# Patient Record
Sex: Female | Born: 1985 | Race: Black or African American | Hispanic: No | Smoking: Never smoker
Health system: Southern US, Community
[De-identification: ages and names within clinical notes are randomized; demographics above are authoritative.]

## PROBLEM LIST (undated history)

## (undated) ENCOUNTER — Inpatient Hospital Stay (HOSPITAL_COMMUNITY): Payer: Self-pay

## (undated) DIAGNOSIS — J309 Allergic rhinitis, unspecified: Secondary | ICD-10-CM

## (undated) DIAGNOSIS — F419 Anxiety disorder, unspecified: Secondary | ICD-10-CM

## (undated) DIAGNOSIS — R87629 Unspecified abnormal cytological findings in specimens from vagina: Secondary | ICD-10-CM

## (undated) DIAGNOSIS — N83209 Unspecified ovarian cyst, unspecified side: Secondary | ICD-10-CM

## (undated) DIAGNOSIS — M2550 Pain in unspecified joint: Secondary | ICD-10-CM

## (undated) DIAGNOSIS — M3500 Sicca syndrome, unspecified: Secondary | ICD-10-CM

## (undated) DIAGNOSIS — K219 Gastro-esophageal reflux disease without esophagitis: Secondary | ICD-10-CM

## (undated) DIAGNOSIS — N750 Cyst of Bartholin's gland: Secondary | ICD-10-CM

## (undated) DIAGNOSIS — G43009 Migraine without aura, not intractable, without status migrainosus: Principal | ICD-10-CM

## (undated) HISTORY — DX: Cyst of Bartholin's gland: N75.0

## (undated) HISTORY — DX: Sjogren syndrome, unspecified: M35.00

## (undated) HISTORY — DX: Anxiety disorder, unspecified: F41.9

## (undated) HISTORY — PX: ADENOIDECTOMY: SUR15

## (undated) HISTORY — DX: Unspecified abnormal cytological findings in specimens from vagina: R87.629

## (undated) HISTORY — DX: Migraine without aura, not intractable, without status migrainosus: G43.009

## (undated) HISTORY — DX: Gastro-esophageal reflux disease without esophagitis: K21.9

## (undated) HISTORY — DX: Pain in unspecified joint: M25.50

## (undated) HISTORY — PX: THERAPEUTIC ABORTION: SHX798

## (undated) HISTORY — DX: Allergic rhinitis, unspecified: J30.9

## (undated) HISTORY — PX: TONSILLECTOMY: SUR1361

## (undated) HISTORY — PX: TYMPANOSTOMY TUBE PLACEMENT: SHX32

---
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
## 2006-10-18 ENCOUNTER — Emergency Department (HOSPITAL_COMMUNITY): Admission: EM | Admit: 2006-10-18 | Discharge: 2006-10-18 | Payer: Self-pay | Admitting: Family Medicine

## 2006-12-27 ENCOUNTER — Encounter: Admission: RE | Admit: 2006-12-27 | Discharge: 2006-12-27 | Payer: Self-pay | Admitting: Internal Medicine

## 2007-06-28 ENCOUNTER — Emergency Department (HOSPITAL_COMMUNITY): Admission: EM | Admit: 2007-06-28 | Discharge: 2007-06-28 | Payer: Self-pay | Admitting: Obstetrics and Gynecology

## 2007-12-26 ENCOUNTER — Inpatient Hospital Stay (HOSPITAL_COMMUNITY): Admission: AD | Admit: 2007-12-26 | Discharge: 2007-12-27 | Payer: Self-pay | Admitting: Obstetrics & Gynecology

## 2008-01-22 ENCOUNTER — Inpatient Hospital Stay (HOSPITAL_COMMUNITY): Admission: AD | Admit: 2008-01-22 | Discharge: 2008-01-22 | Payer: Self-pay | Admitting: Obstetrics & Gynecology

## 2008-03-11 ENCOUNTER — Inpatient Hospital Stay (HOSPITAL_COMMUNITY): Admission: AD | Admit: 2008-03-11 | Discharge: 2008-03-11 | Payer: Self-pay | Admitting: Obstetrics and Gynecology

## 2008-03-25 ENCOUNTER — Inpatient Hospital Stay (HOSPITAL_COMMUNITY): Admission: AD | Admit: 2008-03-25 | Discharge: 2008-03-25 | Payer: Self-pay | Admitting: Obstetrics and Gynecology

## 2008-04-11 ENCOUNTER — Inpatient Hospital Stay (HOSPITAL_COMMUNITY): Admission: AD | Admit: 2008-04-11 | Discharge: 2008-04-11 | Payer: Self-pay | Admitting: Obstetrics and Gynecology

## 2008-06-13 ENCOUNTER — Emergency Department (HOSPITAL_COMMUNITY): Admission: EM | Admit: 2008-06-13 | Discharge: 2008-06-13 | Payer: Self-pay | Admitting: Family Medicine

## 2008-08-06 ENCOUNTER — Inpatient Hospital Stay (HOSPITAL_COMMUNITY): Admission: AD | Admit: 2008-08-06 | Discharge: 2008-08-06 | Payer: Self-pay | Admitting: Obstetrics & Gynecology

## 2008-08-16 ENCOUNTER — Inpatient Hospital Stay (HOSPITAL_COMMUNITY): Admission: AD | Admit: 2008-08-16 | Discharge: 2008-08-16 | Payer: Self-pay | Admitting: Family Medicine

## 2008-08-21 ENCOUNTER — Inpatient Hospital Stay (HOSPITAL_COMMUNITY): Admission: AD | Admit: 2008-08-21 | Discharge: 2008-08-23 | Payer: Self-pay | Admitting: Obstetrics and Gynecology

## 2008-08-21 ENCOUNTER — Encounter (INDEPENDENT_AMBULATORY_CARE_PROVIDER_SITE_OTHER): Payer: Self-pay | Admitting: Obstetrics and Gynecology

## 2008-11-12 ENCOUNTER — Emergency Department (HOSPITAL_COMMUNITY): Admission: EM | Admit: 2008-11-12 | Discharge: 2008-11-12 | Payer: Self-pay | Admitting: Emergency Medicine

## 2009-02-15 ENCOUNTER — Inpatient Hospital Stay (HOSPITAL_COMMUNITY): Admission: AD | Admit: 2009-02-15 | Discharge: 2009-02-16 | Payer: Self-pay | Admitting: Obstetrics & Gynecology

## 2009-02-15 ENCOUNTER — Ambulatory Visit: Payer: Self-pay | Admitting: Advanced Practice Midwife

## 2009-02-16 ENCOUNTER — Emergency Department (HOSPITAL_COMMUNITY): Admission: EM | Admit: 2009-02-16 | Discharge: 2009-02-16 | Payer: Self-pay | Admitting: Emergency Medicine

## 2009-02-24 ENCOUNTER — Emergency Department (HOSPITAL_COMMUNITY): Admission: EM | Admit: 2009-02-24 | Discharge: 2009-02-24 | Payer: Self-pay | Admitting: Family Medicine

## 2009-05-14 ENCOUNTER — Ambulatory Visit: Payer: Self-pay | Admitting: Obstetrics and Gynecology

## 2009-05-14 ENCOUNTER — Inpatient Hospital Stay (HOSPITAL_COMMUNITY): Admission: AD | Admit: 2009-05-14 | Discharge: 2009-05-14 | Payer: Self-pay | Admitting: Obstetrics and Gynecology

## 2009-05-28 ENCOUNTER — Inpatient Hospital Stay (HOSPITAL_COMMUNITY): Admission: AD | Admit: 2009-05-28 | Discharge: 2009-05-28 | Payer: Self-pay | Admitting: Obstetrics & Gynecology

## 2009-05-31 ENCOUNTER — Ambulatory Visit: Admission: AD | Admit: 2009-05-31 | Discharge: 2009-05-31 | Payer: Self-pay | Admitting: Family Medicine

## 2009-06-06 ENCOUNTER — Encounter: Payer: Self-pay | Admitting: Family Medicine

## 2009-06-06 ENCOUNTER — Ambulatory Visit (HOSPITAL_COMMUNITY): Admission: RE | Admit: 2009-06-06 | Discharge: 2009-06-06 | Payer: Self-pay | Admitting: Family Medicine

## 2009-08-02 ENCOUNTER — Inpatient Hospital Stay (HOSPITAL_COMMUNITY): Admission: AD | Admit: 2009-08-02 | Discharge: 2009-08-02 | Payer: Self-pay | Admitting: Family Medicine

## 2009-09-03 ENCOUNTER — Ambulatory Visit (HOSPITAL_COMMUNITY): Admission: RE | Admit: 2009-09-03 | Discharge: 2009-09-03 | Payer: Self-pay | Admitting: Obstetrics

## 2010-01-19 ENCOUNTER — Inpatient Hospital Stay (HOSPITAL_COMMUNITY): Admission: AD | Admit: 2010-01-19 | Discharge: 2010-01-19 | Payer: Self-pay | Admitting: Obstetrics and Gynecology

## 2010-01-20 ENCOUNTER — Inpatient Hospital Stay (HOSPITAL_COMMUNITY): Admission: AD | Admit: 2010-01-20 | Discharge: 2010-01-22 | Payer: Self-pay | Admitting: Obstetrics and Gynecology

## 2010-01-28 ENCOUNTER — Inpatient Hospital Stay (HOSPITAL_COMMUNITY): Admission: AD | Admit: 2010-01-28 | Discharge: 2010-01-28 | Payer: Self-pay | Admitting: Obstetrics and Gynecology

## 2010-08-12 ENCOUNTER — Emergency Department (HOSPITAL_COMMUNITY): Admission: EM | Admit: 2010-08-12 | Discharge: 2010-08-12 | Payer: Self-pay | Admitting: Emergency Medicine

## 2010-10-18 ENCOUNTER — Encounter: Payer: Self-pay | Admitting: Obstetrics

## 2010-12-15 LAB — CBC
HCT: 37.8 % (ref 36.0–46.0)
HCT: 37.5 % (ref 36.0–46.0)
Hemoglobin: 12.6 g/dL (ref 12.0–15.0)
MCHC: 33.7 g/dL (ref 30.0–36.0)
MCHC: 33.8 g/dL (ref 30.0–36.0)
MCV: 93.1 fL (ref 78.0–100.0)
MCV: 93.3 fL (ref 78.0–100.0)
Platelets: 335 10*3/uL (ref 150–400)
Platelets: 228 10*3/uL (ref 150–400)
Platelets: 264 10*3/uL (ref 150–400)
RDW: 13 % (ref 11.5–15.5)
RDW: 12.8 % (ref 11.5–15.5)
WBC: 10.6 10*3/uL — ABNORMAL HIGH (ref 4.0–10.5)

## 2010-12-15 LAB — COMPREHENSIVE METABOLIC PANEL
ALT: 23 U/L (ref 0–35)
AST: 18 U/L (ref 0–37)
Albumin: 3 g/dL — ABNORMAL LOW (ref 3.5–5.2)
Calcium: 9.1 mg/dL (ref 8.4–10.5)
Creatinine, Ser: 0.69 mg/dL (ref 0.4–1.2)
Sodium: 138 mEq/L (ref 135–145)
Total Protein: 6.7 g/dL (ref 6.0–8.3)

## 2010-12-15 LAB — URINALYSIS, ROUTINE W REFLEX MICROSCOPIC
Glucose, UA: NEGATIVE mg/dL
Leukocytes, UA: NEGATIVE
Protein, ur: NEGATIVE mg/dL
Specific Gravity, Urine: 1.015 (ref 1.005–1.030)
pH: 7 (ref 5.0–8.0)

## 2010-12-15 LAB — BLOOD GAS, ARTERIAL
Acid-Base Excess: 1.5 mmol/L (ref 0.0–2.0)
FIO2: 0.21 %
O2 Saturation: 99 %
pO2, Arterial: 109 mmHg — ABNORMAL HIGH (ref 80.0–100.0)

## 2010-12-15 LAB — URINE MICROSCOPIC-ADD ON

## 2010-12-30 LAB — URINALYSIS, ROUTINE W REFLEX MICROSCOPIC
Ketones, ur: NEGATIVE mg/dL
Nitrite: NEGATIVE
Protein, ur: NEGATIVE mg/dL

## 2011-01-01 LAB — GC/CHLAMYDIA PROBE AMP, GENITAL: Chlamydia, DNA Probe: NEGATIVE

## 2011-01-01 LAB — URINALYSIS, ROUTINE W REFLEX MICROSCOPIC
Bilirubin Urine: NEGATIVE
Ketones, ur: 15 mg/dL — AB
Nitrite: NEGATIVE
Protein, ur: NEGATIVE mg/dL
Urobilinogen, UA: 1 mg/dL (ref 0.0–1.0)

## 2011-01-01 LAB — HCG, QUANTITATIVE, PREGNANCY: hCG, Beta Chain, Quant, S: 3501 m[IU]/mL — ABNORMAL HIGH (ref <5)

## 2011-01-01 LAB — ABO/RH: ABO/RH(D): O POS

## 2011-01-01 LAB — WET PREP, GENITAL
Clue Cells Wet Prep HPF POC: NONE SEEN
Trich, Wet Prep: NONE SEEN

## 2011-01-01 LAB — CBC
MCV: 92.8 fL (ref 78.0–100.0)
RBC: 4.36 MIL/uL (ref 3.87–5.11)
WBC: 6.9 10*3/uL (ref 4.0–10.5)

## 2011-01-02 LAB — URINALYSIS, ROUTINE W REFLEX MICROSCOPIC
Bilirubin Urine: NEGATIVE
Glucose, UA: NEGATIVE mg/dL
Hgb urine dipstick: NEGATIVE
Specific Gravity, Urine: 1.02 (ref 1.005–1.030)
pH: 7.5 (ref 5.0–8.0)

## 2011-01-05 LAB — URINALYSIS, ROUTINE W REFLEX MICROSCOPIC
Glucose, UA: NEGATIVE mg/dL
Specific Gravity, Urine: >1.03 — ABNORMAL HIGH (ref 1.005–1.030)
pH: 6 (ref 5.0–8.0)

## 2011-01-05 LAB — WET PREP, GENITAL
Trich, Wet Prep: NONE SEEN
Yeast Wet Prep HPF POC: NONE SEEN

## 2011-01-05 LAB — GC/CHLAMYDIA PROBE AMP, GENITAL: GC Probe Amp, Genital: NEGATIVE

## 2011-02-09 NOTE — H&P (Signed)
Veronica Raymond, Veronica Raymond              ACCOUNT NO.:  [id number]   MEDICAL RECORD NO.:  [id number]          PATIENT TYPE:  INP   LOCATION:  9169                          FACILITY:  [facility]   PHYSICIAN:  Guy Sandifer. Henderson Cloud, M.D. DATE OF BIRTH:  Feb 07, 1986   DATE OF ADMISSION:  08/21/2008  DATE OF DISCHARGE:                              HISTORY & PHYSICAL   CHIEF COMPLAINT:  Oligohydramnios   HISTORY OF PRESENT ILLNESS:  This patient is a 25 year old black female,  G2, P18, with an EDC of August 19, 2008, placing her 40-2/7 weeks  estimated gestational age with dates confirmed by a 13-week ultrasound.  Group B beta strep culture is negative.  Ultrasound evaluation today in  the office revealed a vertex baby with an estimated fetal weight of 6  pounds 5 ounces at the 7th percentile.  Amniotic fluid index is 5.1,  less than the 3rd percentile.  Biophysical profile was 6/8.  She denies  leaking, contractions.  There is good fetal movement.  No central  nervous system changes or epigastric pain.   PAST MEDICAL AND SURGICAL HISTORY, FAMILY HISTORY, OBSTETRIC HISTORY,  SOCIAL HISTORY:  See prenatal history and physical.   MEDICATIONS:  Prenatal vitamins.   ALLERGIES:  NO KNOWN DRUG ALLERGIES.   PHYSICAL EXAMINATION:  Height 5 feet 5 inches, weight 173.2 pounds,  blood pressure 110/70.  LUNGS:  Clear to auscultation.  HEART:  Regular rate and rhythm.  ABDOMEN:  Gravid, nontender.  No epigastric tenderness.  Cervix is 3-4 cm dilated, 90% effaced -1 station and vertex.  EXTREMITIES:  Grossly within normal limits.  NEUROLOGICAL EXAM:.  Deep tendon reflexes 2+ without clonus.   LABORATORY:  Group B beta strep culture is negative.  Blood type O+.  Rh  antibody screen negative.  RPR nonreactive.  Rubella immune.  Hepatitis  B surface antigen negative.  HIV nonreactive.   ASSESSMENT:  Intrauterine pregnancy at 40-2/7 weeks with  oligohydramnios.   PLAN:  We will admit.  Anticipate artificial  rupture of membranes.  Will  supplement with Pitocin, if necessary.      Guy Sandifer Henderson Cloud, M.D.  Electronically Signed     JET/MEDQ  D:  08/21/2008  T:  08/21/2008  Job:  [id number]

## 2011-03-01 IMAGING — CR DG CHEST 2V
2 series · 2 of 2 positions shown · non-contrast
Comparison: None.

CLINICAL DATA: History of shortness of breath and headache.  Chest
pain at night.

CHEST - 2 VIEW

[view not recorded (1 of 2)]
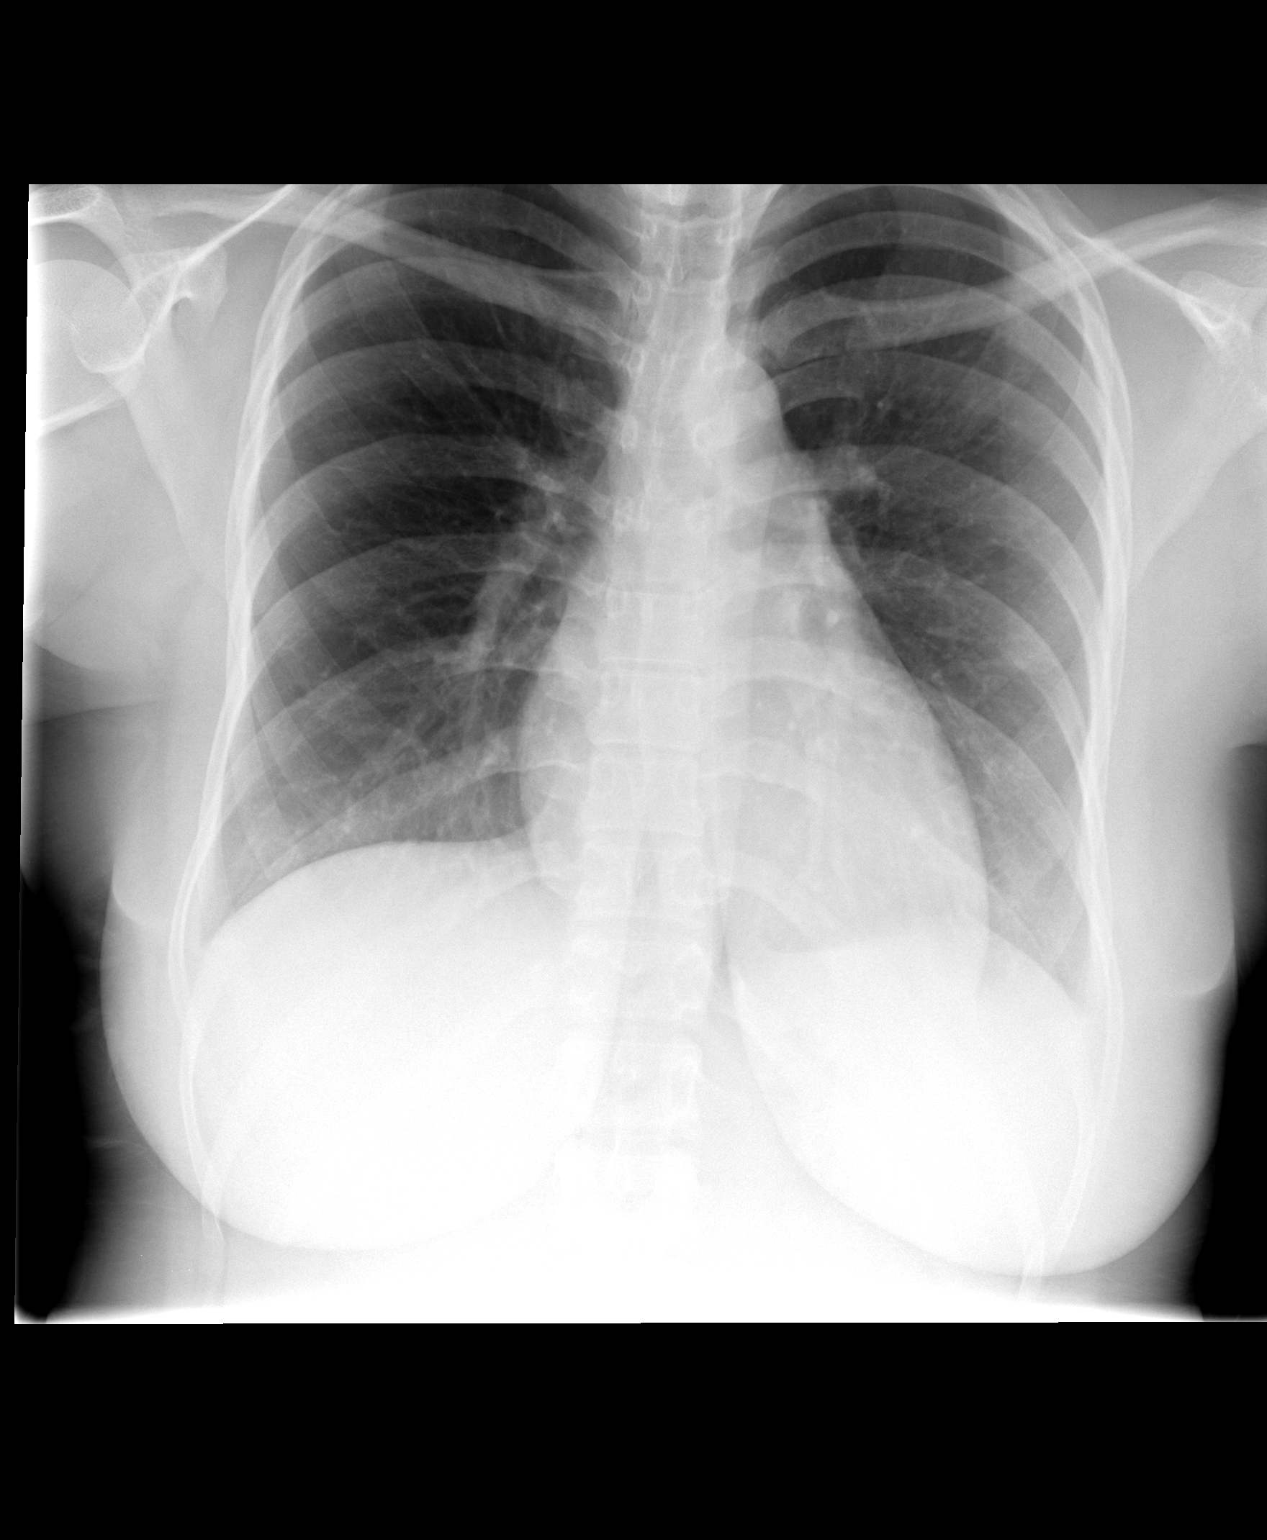

[view not recorded (2 of 2)]
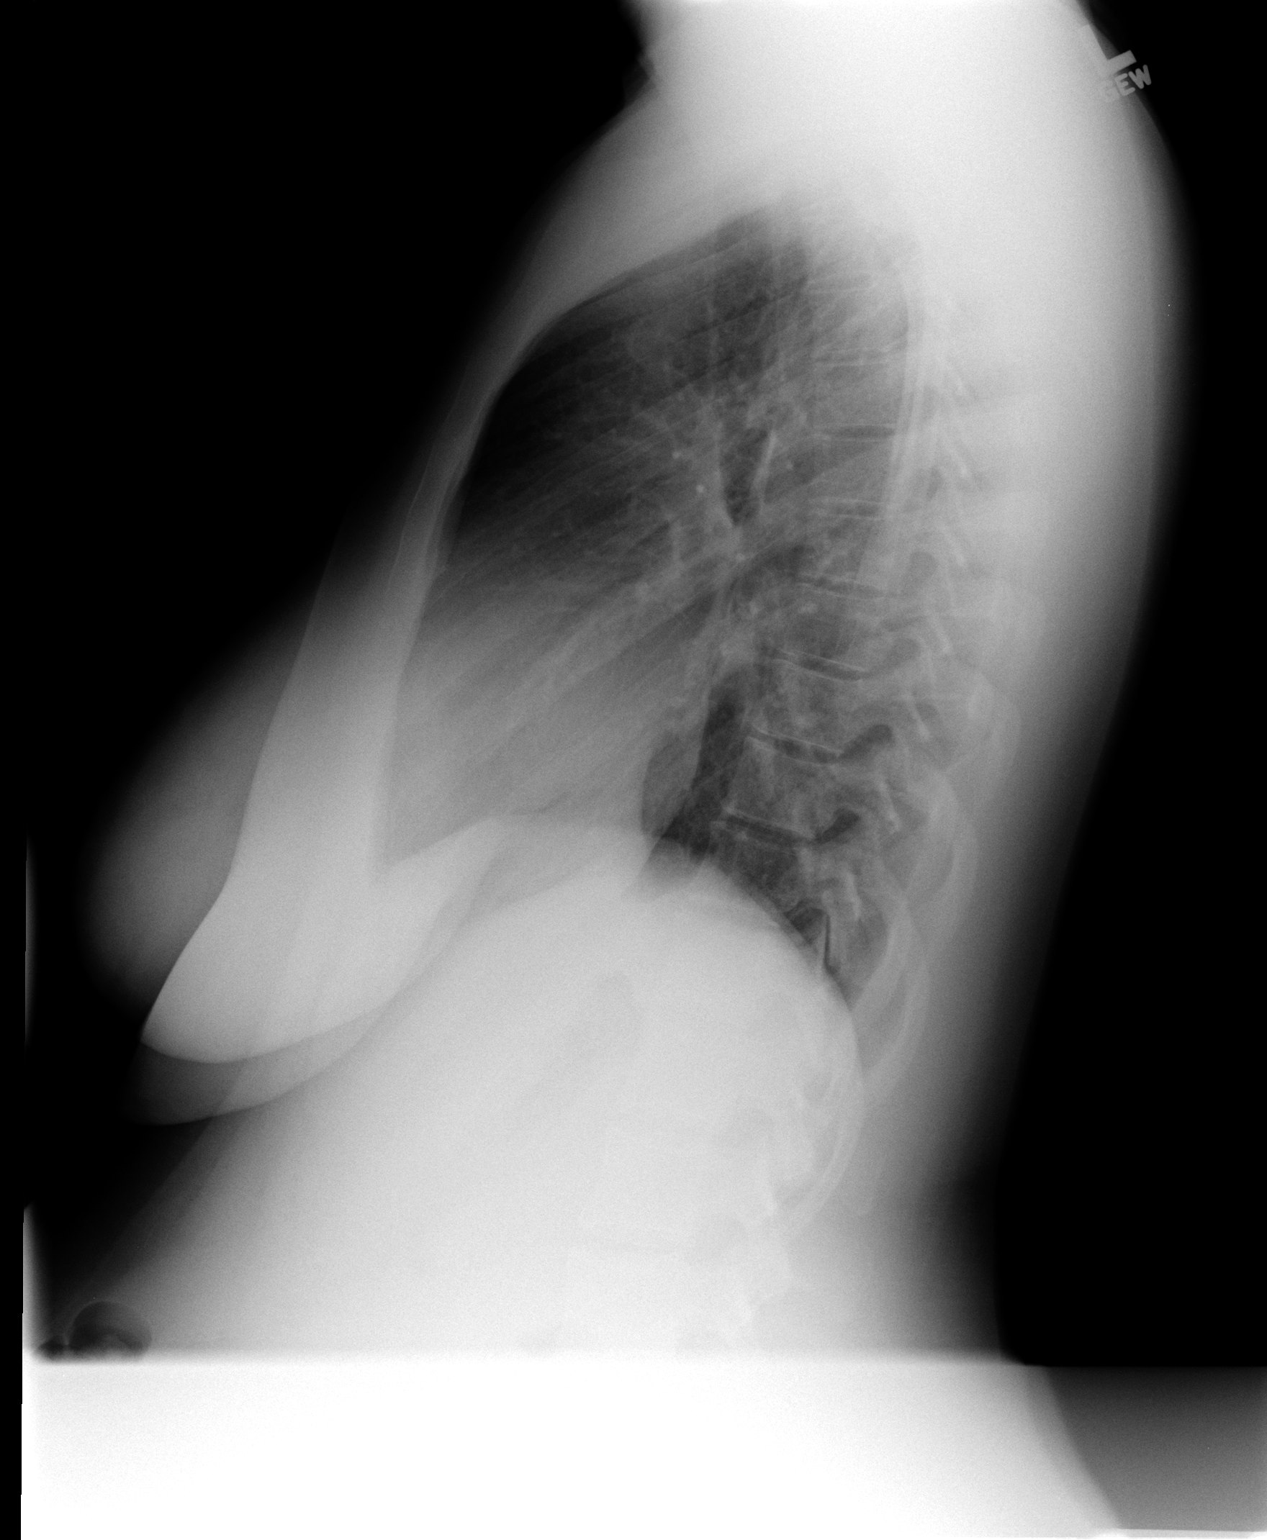

[2 of 2 positions shown; findings below may reference images not displayed]

FINDINGS: The cardiac silhouette is normal size and shape. The
lungs are well aerated and free of infiltrates. No pleural
abnormality is evident. Bones appear average for age.
IMPRESSION: No acute or active process is  identified.

## 2011-06-21 LAB — CBC
MCV: 90.5
RBC: 4.12
WBC: 6.5

## 2011-06-21 LAB — ABO/RH: ABO/RH(D): O POS

## 2011-06-21 LAB — URINALYSIS, ROUTINE W REFLEX MICROSCOPIC
Bilirubin Urine: NEGATIVE
Hgb urine dipstick: NEGATIVE
Nitrite: NEGATIVE
Specific Gravity, Urine: 1.015
pH: 7

## 2011-06-21 LAB — GC/CHLAMYDIA PROBE AMP, GENITAL: Chlamydia, DNA Probe: NEGATIVE

## 2011-06-21 LAB — WET PREP, GENITAL
Trich, Wet Prep: NONE SEEN
Yeast Wet Prep HPF POC: NONE SEEN

## 2011-06-21 LAB — POCT PREGNANCY, URINE: Operator id: 251141

## 2011-06-21 LAB — HCG, QUANTITATIVE, PREGNANCY: hCG, Beta Chain, Quant, S: 16101 — ABNORMAL HIGH

## 2011-06-22 LAB — URINALYSIS, ROUTINE W REFLEX MICROSCOPIC
Bilirubin Urine: NEGATIVE
Glucose, UA: NEGATIVE
Hgb urine dipstick: NEGATIVE
Ketones, ur: NEGATIVE
Protein, ur: NEGATIVE

## 2011-06-24 LAB — URINALYSIS, ROUTINE W REFLEX MICROSCOPIC
Bilirubin Urine: NEGATIVE
Glucose, UA: NEGATIVE
Hgb urine dipstick: NEGATIVE
Ketones, ur: NEGATIVE
Specific Gravity, Urine: 1.02
pH: 6

## 2011-06-29 LAB — CBC
HCT: 34 — ABNORMAL LOW
HCT: 36.2
Hemoglobin: 11.5 — ABNORMAL LOW
Hemoglobin: 12.3
MCHC: 33.9
MCV: 94.3
MCV: 94.6
Platelets: 261
RBC: 3.83 — ABNORMAL LOW
RDW: 13.3
WBC: 8.6

## 2011-07-08 LAB — DIFFERENTIAL
Basophils Relative: 0
Lymphs Abs: 2.5
Monocytes Absolute: 0.5
Monocytes Relative: 6
Neutro Abs: 5.7

## 2011-07-08 LAB — CBC
Hemoglobin: 12.7
MCHC: 34.1
MCV: 90.6
RBC: 4.12

## 2011-07-08 LAB — BASIC METABOLIC PANEL
BUN: 9
Creatinine, Ser: 0.76
GFR calc non Af Amer: >60
Glucose, Bld: 87
Potassium: 3.5

## 2012-04-04 ENCOUNTER — Emergency Department (HOSPITAL_COMMUNITY): Payer: Medicaid Other

## 2012-04-04 ENCOUNTER — Encounter (HOSPITAL_COMMUNITY): Payer: Self-pay | Admitting: *Deleted

## 2012-04-04 ENCOUNTER — Emergency Department (HOSPITAL_COMMUNITY)
Admission: EM | Admit: 2012-04-04 | Discharge: 2012-04-05 | Disposition: A | Discharge status: Home / Self Care | Payer: Medicaid Other | Attending: Emergency Medicine | Admitting: Emergency Medicine

## 2012-04-04 DIAGNOSIS — S56919A Strain of unspecified muscles, fascia and tendons at forearm level, unspecified arm, initial encounter: Secondary | ICD-10-CM

## 2012-04-04 DIAGNOSIS — X503XXA Overexertion from repetitive movements, initial encounter: Secondary | ICD-10-CM | POA: Insufficient documentation

## 2012-04-04 DIAGNOSIS — IMO0002 Reserved for concepts with insufficient information to code with codable children: Secondary | ICD-10-CM | POA: Insufficient documentation

## 2012-04-04 DIAGNOSIS — Y990 Civilian activity done for income or pay: Secondary | ICD-10-CM | POA: Insufficient documentation

## 2012-04-04 NOTE — ED Notes (Signed)
Pt was lifting boxes at work, felt sharp pain in left wrist and now it hurts to move.  When moving pain shoots up arm.

## 2012-04-05 MED ORDER — NAPROXEN 500 MG PO TABS: 500.0000 mg | ORAL_TABLET | Freq: Two times a day (BID) | ORAL | Status: DC

## 2012-04-05 MED ORDER — IBUPROFEN 400 MG PO TABS
800.0000 mg | ORAL_TABLET | Freq: Once | ORAL | Status: AC
  Administered 2012-04-05: 800 mg via ORAL
  Filled 2012-04-05: qty 2

## 2012-04-05 NOTE — ED Provider Notes (Signed)
History     CSN: [id number]  Arrival date & time 04/04/12  2049   First MD Initiated Contact with Patient 04/04/12 2337      Chief Complaint  Patient presents with  . Wrist Pain   HPI  History provided by the patient. Patient is a 26 year old female with no significant past medical history who presents with complaints of left wrist and forearm pains. Patient reports that pain began after work this evening. 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 She denies any specific injury during this time but then developed pain throughout her forearm. Patient states pain is worse with supination pronating her hand and wrist. She also has pain with flexion and extension with wrist. She denies any deformity. She denies any numbness or weakness in the fingers. Pain is described as moderate. Patient has not used any treatments for symptoms.   History reviewed. No pertinent past medical history.  Past Surgical History  Procedure Date  . Adenoidectomy     History reviewed. No pertinent family history.  History  Substance Use Topics  . Smoking status: Never Smoker   . Smokeless tobacco: Not on file  . Alcohol Use: No    OB History    Grav Para Term Preterm Abortions TAB SAB Ect Mult Living                  Review of Systems  Musculoskeletal: Negative for joint swelling.       Left wrist and forearm pain  Skin: Negative for rash.  Neurological: Negative for weakness and numbness.    Allergies  Iodinated diagnostic agents  Home Medications  No current outpatient prescriptions on file.  BP 106/57  Pulse 68  Temp 98.7 F (37.1 C) (Oral)  Resp 16  SpO2 99%  Physical Exam  Nursing note and vitals reviewed. Constitutional: She is oriented to person, place, and time. She appears well-developed and well-nourished. No distress.  HENT:  Head: Normocephalic.  Cardiovascular: Normal rate and regular rhythm.   Pulmonary/Chest:  Effort normal and breath sounds normal.  Musculoskeletal:       Pain with range of motion of left wrist and forearm but is full. Pain is greatest with supination. There is tenderness along the flexor muscles of the forearm. No gross deformity or mass. Normal pulses and wrist. Normal sensation in fingers and cap refill less than 2 seconds.  Neurological: She is alert and oriented to person, place, and time.  Skin: Skin is warm and dry. No erythema.  Psychiatric: She has a normal mood and affect. Her behavior is normal.    ED Course  Procedures   Dg Wrist Complete Left  04/04/2012  *RADIOLOGY REPORT*  Clinical Data: Left lateral wrist pain.  LEFT WRIST - COMPLETE 3+ VIEW  Comparison: None.  Findings: There is no evidence of fracture or dislocation.  The carpal rows are intact, and demonstrate normal alignment.  The joint spaces are preserved.  No significant soft tissue abnormalities are seen.  IMPRESSION: No evidence of fracture or dislocation.  Original Report Authenticated By: Tonia Ghent, M.D.     1. Muscle strain of forearm       MDM  12:00AM patient seen and evaluated. X-rays unremarkable. He should with tenderness along the muscles and tendons. This time suspect overuse and inflammation with strain to the muscles. Will advise patient to use RICE and followup with PCP.  Angus Seller, [place] 04/05/12 [phone]

## 2012-04-06 NOTE — ED Provider Notes (Signed)
Medical screening examination/treatment/procedure(s) were performed by non-physician practitioner and as supervising physician I was immediately available for consultation/collaboration.  Kit Brubacher, MD 04/06/12 2356 

## 2012-07-04 ENCOUNTER — Encounter (HOSPITAL_COMMUNITY): Payer: Self-pay | Admitting: *Deleted

## 2012-07-04 ENCOUNTER — Emergency Department (HOSPITAL_COMMUNITY)
Admission: EM | Admit: 2012-07-04 | Discharge: 2012-07-04 | Disposition: A | Discharge status: Home / Self Care | Payer: Medicaid Other | Attending: Emergency Medicine | Admitting: Emergency Medicine

## 2012-07-04 DIAGNOSIS — G43909 Migraine, unspecified, not intractable, without status migrainosus: Secondary | ICD-10-CM | POA: Insufficient documentation

## 2012-07-04 LAB — URINE MICROSCOPIC-ADD ON

## 2012-07-04 LAB — URINALYSIS, ROUTINE W REFLEX MICROSCOPIC
Glucose, UA: NEGATIVE mg/dL
Hgb urine dipstick: NEGATIVE
Protein, ur: NEGATIVE mg/dL
pH: 7.5 (ref 5.0–8.0)

## 2012-07-04 LAB — POCT PREGNANCY, URINE: Preg Test, Ur: NEGATIVE

## 2012-07-04 MED ORDER — DIPHENHYDRAMINE HCL 50 MG/ML IJ SOLN: 12.5000 mg | Freq: Once | INTRAMUSCULAR | Status: DC

## 2012-07-04 MED ORDER — METOCLOPRAMIDE HCL 5 MG/ML IJ SOLN
10.0000 mg | Freq: Once | INTRAMUSCULAR | Status: AC
  Administered 2012-07-04: 10 mg via INTRAVENOUS
  Filled 2012-07-04: qty 2

## 2012-07-04 MED ORDER — KETOROLAC TROMETHAMINE 30 MG/ML IJ SOLN
30.0000 mg | Freq: Once | INTRAMUSCULAR | Status: AC
  Administered 2012-07-04: 30 mg via INTRAVENOUS
  Filled 2012-07-04: qty 1

## 2012-07-04 MED ORDER — DIPHENHYDRAMINE HCL 50 MG/ML IJ SOLN
25.0000 mg | Freq: Once | INTRAMUSCULAR | Status: AC
  Administered 2012-07-04: 18:00:00 via INTRAVENOUS
  Filled 2012-07-04: qty 1

## 2012-07-04 MED ORDER — DEXAMETHASONE SODIUM PHOSPHATE 10 MG/ML IJ SOLN
10.0000 mg | Freq: Once | INTRAMUSCULAR | Status: AC
  Administered 2012-07-04: 10 mg via INTRAVENOUS
  Filled 2012-07-04: qty 1

## 2012-07-04 NOTE — ED Notes (Signed)
Pt c/o shaky and dizziness at meds (Reglan) will monitor Tia PA aware

## 2012-07-04 NOTE — ED Notes (Addendum)
Pt c/o of headache 10/10 started this morning around 0900.  Pt reports dizziness started at the time same. Hx of migraines. Pt also reports intermittent right hand numbness. At present pt is tearful. Reports blurry vision. Sensitivity to light.

## 2012-07-04 NOTE — ED Notes (Signed)
Patient discharged home by self.  Alert and oriented.  Discussed discharge instructions.  Patient verbalized understanding. Vitals WNL.  Headache pain tolerable at 1/10 at this time.  No other concerns.  Barrie Lyme 8:17 PM 07/04/2012

## 2012-07-04 NOTE — ED Provider Notes (Signed)
Medical screening examination/treatment/procedure(s) were performed by non-physician practitioner and as supervising physician I was immediately available for consultation/collaboration.  Tobin Chad, MD 07/04/12 2342

## 2012-07-04 NOTE — ED Notes (Signed)
Upon further assessment. Pt reports her dizziness is subsiding and headache is getting better. Pt reports she normally gets shots for headaches.

## 2012-07-04 NOTE — ED Notes (Signed)
PA at [facility] thinks pt should be evaluated further due to intermittent right hand numbness

## 2012-07-04 NOTE — ED Notes (Signed)
Pt also wears glasses and did not have her glasses at the time

## 2012-07-04 NOTE — ED Provider Notes (Signed)
History     CSN: [id number]  Arrival date & time 07/04/12  1258   First MD Initiated Contact with Patient 07/04/12 1444      Chief Complaint  Patient presents with  . Headache  . Dizziness    (Consider location/radiation/quality/duration/timing/severity/associated sxs/prior treatment) HPI Comments: Patient presents with migraine headache. She states that the headache is frontal, 10/10, with associated dizziness, nausea, right arm tingling, blurry vision, photophobia, and photophobia. States that this is like her other migraines. Denies fever or chills. Denies V,D, or abdominal pain. Denies neck stiffness. Denies that this is the worst headache of her life.  The history is provided by the patient. No language interpreter was used.    History reviewed. No pertinent past medical history.  Past Surgical History  Procedure Date  . Adenoidectomy     History reviewed. No pertinent family history.  History  Substance Use Topics  . Smoking status: Never Smoker   . Smokeless tobacco: Not on file  . Alcohol Use: No    OB History    Grav Para Term Preterm Abortions TAB SAB Ect Mult Living                  Review of Systems  Constitutional: Negative for fever and chills.  HENT: Negative for neck stiffness.   Eyes: Positive for photophobia and visual disturbance.  Gastrointestinal: Positive for nausea. Negative for vomiting, abdominal pain and diarrhea.  Neurological: Positive for numbness and headaches.    Allergies  Iodinated diagnostic agents  Home Medications   Current Outpatient Rx  Name Route Sig Dispense Refill  . LEVONORGESTREL 0.75 MG PO TABS Oral Take 0.75 mg by mouth once. Emergency contraception      BP 130/50  Pulse 83  Temp 98.2 F (36.8 C)  Resp 16  SpO2 100%  Physical Exam  Nursing note and vitals reviewed. Constitutional: She is oriented to person, place, and time. She appears well-developed and well-nourished. She appears distressed.  HENT:    Head: Normocephalic and atraumatic.  Mouth/Throat: Oropharynx is clear and moist.  Eyes: Conjunctivae normal and EOM are normal. Pupils are equal, round, and reactive to light. No scleral icterus.       No deficits on visual fields by confrontation.  Neck: Normal range of motion. Neck supple.  Cardiovascular: Normal rate, regular rhythm and normal heart sounds.   Pulmonary/Chest: Effort normal and breath sounds normal.  Abdominal: Soft. Bowel sounds are normal. There is no tenderness.  Musculoskeletal: Normal range of motion.       Neck has good ROM.  Neurological: She is alert and oriented to person, place, and time. No cranial nerve deficit. She exhibits normal muscle tone. Coordination normal.       Cranial nerves II-XII grossly intact. Good sensation. Good strength. No pronator drift. Negative romberg. No facial droop.    ED Course  Procedures (including critical care time)  Labs Reviewed  URINALYSIS, ROUTINE W REFLEX MICROSCOPIC - Abnormal; Notable for the following:    APPearance CLOUDY (*)     Leukocytes, UA MODERATE (*)     All other components within normal limits  GLUCOSE, CAPILLARY  POCT PREGNANCY, URINE  URINE MICROSCOPIC-ADD ON   Results for orders placed during the hospital encounter of 07/04/12  GLUCOSE, CAPILLARY      Component Value Range   Glucose-Capillary 82  70 - 99 mg/dL  URINALYSIS, ROUTINE W REFLEX MICROSCOPIC      Component Value Range   Color, Urine YELLOW  YELLOW   APPearance CLOUDY (*) CLEAR   Specific Gravity, Urine 1.022  1.005 - 1.030   pH 7.5  5.0 - 8.0   Glucose, UA NEGATIVE  NEGATIVE mg/dL   Hgb urine dipstick NEGATIVE  NEGATIVE   Bilirubin Urine NEGATIVE  NEGATIVE   Ketones, ur NEGATIVE  NEGATIVE mg/dL   Protein, ur NEGATIVE  NEGATIVE mg/dL   Urobilinogen, UA 1.0  0.0 - 1.0 mg/dL   Nitrite NEGATIVE  NEGATIVE   Leukocytes, UA MODERATE (*) NEGATIVE  POCT PREGNANCY, URINE      Component Value Range   Preg Test, Ur NEGATIVE  NEGATIVE   URINE MICROSCOPIC-ADD ON      Component Value Range   Squamous Epithelial / LPF RARE  RARE   WBC, UA 7-10  <3 WBC/hpf   Bacteria, UA RARE  RARE    No results found.   1. Migraine       MDM  Patient presented with migraine similar to her other migraines. Patient complained of right arm numbness and tingling. No neuro deficits or facial droop. Patient given migraine cocktail with resolution of symptoms. Patient discharged with return precautions. No red flags for meningitis, subarachnoid, or CVA.       Pixie Casino, PA-C 07/04/12 1834

## 2012-07-06 ENCOUNTER — Emergency Department (HOSPITAL_COMMUNITY)
Admission: EM | Admit: 2012-07-06 | Discharge: 2012-07-06 | Disposition: A | Discharge status: Home / Self Care | Payer: Medicaid Other | Attending: Emergency Medicine | Admitting: Emergency Medicine

## 2012-07-06 ENCOUNTER — Encounter (HOSPITAL_COMMUNITY): Payer: Self-pay | Admitting: *Deleted

## 2012-07-06 DIAGNOSIS — Z91041 Radiographic dye allergy status: Secondary | ICD-10-CM | POA: Insufficient documentation

## 2012-07-06 DIAGNOSIS — I808 Phlebitis and thrombophlebitis of other sites: Secondary | ICD-10-CM | POA: Insufficient documentation

## 2012-07-06 MED ORDER — NAPROXEN 375 MG PO TABS: 375.0000 mg | ORAL_TABLET | Freq: Two times a day (BID) | ORAL | Status: DC

## 2012-07-06 MED ORDER — CEPHALEXIN 500 MG PO CAPS: 500.0000 mg | ORAL_CAPSULE | Freq: Four times a day (QID) | ORAL | Status: DC

## 2012-07-06 NOTE — ED Notes (Signed)
Pt reports being seen here x 2 days ago, had an IV access.  Pt reports that the IV site was swollen and painful.  Bruising and tenderness noted.  Pt reports bruising is getting bigger.  Denies taking any blood thinners.

## 2012-07-06 NOTE — Discharge Instructions (Signed)
You have phlebitis of the vein in which you had IV few days ago. Start warm compresses to the area multiple times a day. Take naprosyn as prescribed. If redness worsening, start keflex as prescribed, and take it until all gone. Follow up with your doctor as needed, return if worsening.   Phlebitis Phlebitis is a redness, tenderness and soreness (inflammation) in a vein. This can occur in your arms, legs, or torso (trunk), as well as deeper inside your body.  CAUSES  Phlebitis can be triggered by multiple factors. These include:  Reduced (restricted) blood flow through your veins. This happens with prolonged bed rest, long distance travel, injury or surgery. Being overweight (obese) and pregnant can also restrict blood flow and lead to phlebitis.  Putting a catheter in the vein (intravenous or IV) and giving certain medications through in the vein (intravenously).  Cancer and cancer treatment.  Use of illegal intravenous drugs.  Inflammatory diseases.  Inherited (genetic) diseases that increase the risk for blood clots.  Hormone therapy (such as birth control pills). SYMPTOMS   Red, tender, swollen, painful area on your skin.  Usually, the area will be long and narrow.  Low grade fever.  Significant firmness along the center of this area. This can indicate that a blood clot has formed.  Surrounding redness or a high fever, which can indicate an infection (cellulitis). DIAGNOSIS   The appearance of your condition and your symptoms will cause your caregiver to suspect phlebitis. Usually, this is enough for a diagnosis.  Your caregiver may request blood tests or an ultrasound test of the area to be sure you do not have an infection or a blood clot. Blood tests and discussing your family history may also indicate if you have an underlying genetic disease that causes blood clots.  Occasionally, a piece of tissue is taken from the body (biopsy) if an unusual cause of phlebitis is  suspected. TREATMENT   Raise (elevate) the affected area above the level of the heart.  Apply a warm compress or heating pad for 20 minutes, 3 or 4 times a day. If you use an electric heating pad, follow the directions so you do not burn yourself.  Anti-inflammatory medications are usually recommended. Follow your caregiver's directions.  Any IV catheter, if present, will be removed by your caregiver.  Your caregiver may prescribe medicines that kill germs (antibiotics) if an infection is present.  Your caregiver may recommend blood thinners if a blood clot is suspected or present.  Support stockings or bandages may be helpful, depending on the cause and location of the phlebitis.  Surgery may be needed to remove very damaged sections of vein, but this is rare. HOME CARE INSTRUCTIONS   Take medications exactly as prescribed.  Follow up with your caregiver as directed.  Use support stockings or bandages if advised. These will speed healing and prevent recurrence.  If you are on blood thinners:  Do follow-up blood tests exactly as directed.  Check with your caregiver before using any new medications.  Wear a pendant to show that you are on blood thinners.  For phlebitis in the legs:  Avoid prolonged standing or bed rest.  Keep your legs moving. Raise your legs with sitting or lying.  Do not smoke.  Women, particularly those over the age of 12, should consider the risks and benefits of taking the contraceptive pill. This kind of hormone treatment can increase your risk for blood clots. SEEK MEDICAL CARE IF:   You have  unusual bruising or any bleeding problems.  Swelling or pain in your affected arm or leg is not gradually improving.  You are on anti-inflammatory medication and you develop belly (abdominal) pain. SEEK IMMEDIATE MEDICAL CARE IF:   An unexplained oral temperature above 100.5 F (38.1 C) develops.  You have sudden onset of chest pain or difficulty  breathing. Document Released: 09/07/2001 Document Revised: 12/06/2011 Document Reviewed: 06/09/2009 [facility] Patient Information 2013 [place], [place].

## 2012-07-06 NOTE — ED Provider Notes (Signed)
History     CSN: [id number]  Arrival date & time 07/06/12  1304   First MD Initiated Contact with Patient 07/06/12 1502      Chief Complaint  Patient presents with  . Bleeding/Bruising    from IV site    (Consider location/radiation/quality/duration/timing/severity/associated sxs/prior treatment) HPI Comments: Veronica Raymond is a 26 y.o. Female who presents with complaint of pain and bruising in left arm at a recent IV site. Pt states she was seen two days ago, for migraine, and had IV in the left antecubital fossa. States area was bruised, but since yesterday, it is warm to the touch, and redness spreading. Area is tender. Pt states she had IVs before and this has never happened before. States no fever, chills, malaise.     History reviewed. No pertinent past medical history.  Past Surgical History  Procedure Date  . Adenoidectomy     No family history on file.  History  Substance Use Topics  . Smoking status: Never Smoker   . Smokeless tobacco: Not on file  . Alcohol Use: No    OB History    Grav Para Term Preterm Abortions TAB SAB Ect Mult Living                  Review of Systems  Constitutional: Negative for fever and chills.  Respiratory: Negative.   Cardiovascular: Negative.   Gastrointestinal: Negative.   Skin: Positive for color change and wound.  Neurological: Negative for dizziness, weakness and headaches.  Hematological: Negative for adenopathy. Does not bruise/bleed easily.    Allergies  Iodinated diagnostic agents  Home Medications  No current outpatient prescriptions on file.  BP 112/59  Pulse 68  Temp 98.7 F (37.1 C) (Oral)  Resp 16  SpO2 100%  Physical Exam  Nursing note and vitals reviewed. Constitutional: She appears well-developed and well-nourished. No distress.  Eyes: Conjunctivae normal are normal.  Neck: Neck supple.  Cardiovascular: Normal rate, regular rhythm and normal heart sounds.   Pulmonary/Chest: Effort normal  and breath sounds normal. No respiratory distress. She has no wheezes. She has no rales.  Neurological: She is alert.  Skin: Skin is warm and dry.       Bruising over left antecubital fossa. There is some surrounding redness, tenderness to the vein, warm to the touch. Tenderness and redness about 4cm in diameter.   Psychiatric: She has a normal mood and affect. Her behavior is normal. Thought content normal.    ED Course  Procedures (including critical care time)  Pt with phlebitis over left antecubital fossa vein. No fever, no acute distress. Will start on anti inflammatories, warm compresses, keflex if worsening for possible infections phlebitis.   D/c home with follow up as needed.   Filed Vitals:   07/06/12 1358  BP: 112/59  Pulse: 68  Temp: 98.7 F (37.1 C)  Resp: 16    1. Phlebitis of arm       MDM          Lottie Mussel, PA 07/07/12 [phone]

## 2012-07-06 NOTE — ED Notes (Signed)
Pt verbaliozes understyanding

## 2012-07-08 NOTE — ED Provider Notes (Signed)
Medical screening examination/treatment/procedure(s) were performed by non-physician practitioner and as supervising physician I was immediately available for consultation/collaboration.    Thomes Burak R Apolonia Ellwood, MD 07/08/12 1651 

## 2012-07-31 ENCOUNTER — Emergency Department (HOSPITAL_COMMUNITY)
Admission: EM | Admit: 2012-07-31 | Discharge: 2012-07-31 | Disposition: A | Discharge status: Home / Self Care | Payer: Medicaid Other | Source: Home / Self Care | Attending: Family Medicine | Admitting: Family Medicine

## 2012-07-31 ENCOUNTER — Encounter (HOSPITAL_COMMUNITY): Payer: Self-pay | Admitting: Emergency Medicine

## 2012-07-31 DIAGNOSIS — N949 Unspecified condition associated with female genital organs and menstrual cycle: Secondary | ICD-10-CM

## 2012-07-31 DIAGNOSIS — N898 Other specified noninflammatory disorders of vagina: Secondary | ICD-10-CM

## 2012-07-31 DIAGNOSIS — N938 Other specified abnormal uterine and vaginal bleeding: Secondary | ICD-10-CM

## 2012-07-31 LAB — POCT PREGNANCY, URINE: Preg Test, Ur: NEGATIVE

## 2012-07-31 LAB — WET PREP, GENITAL
Trich, Wet Prep: NONE SEEN
Yeast Wet Prep HPF POC: NONE SEEN

## 2012-07-31 MED ORDER — METRONIDAZOLE 500 MG PO TABS: 500.0000 mg | ORAL_TABLET | Freq: Two times a day (BID) | ORAL | Status: DC

## 2012-07-31 NOTE — ED Notes (Signed)
Equipment at bedside. 

## 2012-07-31 NOTE — ED Notes (Signed)
Reports reports migraines and dizziness and nausea for 3 weeks.  Abdominal cramping started one week ago, frequent urination for a month, denies burning with urination.  Reports vaginal discharge for 2 weeks

## 2012-07-31 NOTE — ED Notes (Signed)
Patient left without notifying staff I was able contact her at [phone] she said she had to leave to go pick up her daughter from school and could not wait any longer, I advised her of diagnosis and medication that was sent to pharmacy for her.

## 2012-07-31 NOTE — ED Provider Notes (Signed)
History     CSN: [id number]  Arrival date & time 07/31/12  1201   First MD Initiated Contact with Patient 07/31/12 1447      Chief Complaint  Patient presents with  . Abdominal Pain    (Consider location/radiation/quality/duration/timing/severity/associated sxs/prior treatment) Patient is a 26 y.o. female presenting with abdominal pain. The history is provided by the patient.  Abdominal Pain The primary symptoms of the illness include abdominal pain, vaginal discharge and vaginal bleeding. The current episode started more than 2 days ago (3 weeks lower abdominal pain).  Additional symptoms associated with the illness include urgency and frequency.  Patient report lower abdominal cramping for three weeks, in the past 24 hours pain has gotten increasingly worse. States vaginal discharge for one week, thick white and irritating associated with abnormal bleeding today.  +frequent urination for one month.  Sexually active, engages in protected intercourse, same partner.  No history of STD, no known symptoms in partner.  Last menses greater than one year ago, was on depo for 6 years.  No BCM currently, last pap January 2012-NIL.  History reviewed. No pertinent past medical history.  Past Surgical History  Procedure Date  . Adenoidectomy   . Tympanostomy tube placement     No family history on file.  History  Substance Use Topics  . Smoking status: Never Smoker   . Smokeless tobacco: Not on file  . Alcohol Use: No    OB History    Grav Para Term Preterm Abortions TAB SAB Ect Mult Living                  Review of Systems  Gastrointestinal: Positive for abdominal pain.  Genitourinary: Positive for urgency, frequency, vaginal bleeding and vaginal discharge.  All other systems reviewed and are negative.    Allergies  Iodinated diagnostic agents  Home Medications   Current Outpatient Rx  Name  Route  Sig  Dispense  Refill  . IBUPROFEN 200 MG PO TABS   Oral   Take 200  mg by mouth every 6 (six) hours as needed.         . CEPHALEXIN 500 MG PO CAPS   Oral   Take 1 capsule (500 mg total) by mouth 4 (four) times daily.   28 capsule   0   . METRONIDAZOLE 500 MG PO TABS   Oral   Take 1 tablet (500 mg total) by mouth 2 (two) times daily.   14 tablet   0   . NAPROXEN 375 MG PO TABS   Oral   Take 1 tablet (375 mg total) by mouth 2 (two) times daily.   20 tablet   0     BP 104/70  Pulse 68  Temp 98.3 F (36.8 C) (Oral)  Resp 20  SpO2 100%  Physical Exam  Nursing note and vitals reviewed. Constitutional: She is oriented to person, place, and time. Vital signs are normal. She appears well-developed and well-nourished. She is active and cooperative.  HENT:  Head: Normocephalic.  Eyes: Conjunctivae normal are normal. Pupils are equal, round, and reactive to light. No scleral icterus.  Neck: Trachea normal. Neck supple.  Cardiovascular: Normal rate, regular rhythm, normal heart sounds and intact distal pulses.   Pulmonary/Chest: Effort normal and breath sounds normal.  Abdominal: Soft. Bowel sounds are normal. There is tenderness in the periumbilical area and suprapubic area.  Genitourinary: Uterus normal. Pelvic exam was performed with patient supine. There is no rash, tenderness, lesion or injury  on the right labia. There is no rash, tenderness, lesion or injury on the left labia. Cervix exhibits no motion tenderness, no discharge and no friability. Right adnexum displays no mass, no tenderness and no fullness. Left adnexum displays no mass, no tenderness and no fullness. Vaginal discharge found.       Brownish mucoid discharge in vagina  Lymphadenopathy:    She has no cervical adenopathy.    She has no axillary adenopathy.       Right: No inguinal adenopathy present.       Left: No inguinal adenopathy present.  Neurological: She is alert and oriented to person, place, and time. No cranial nerve deficit or sensory deficit.  Skin: Skin is warm  and dry. No rash noted.  Psychiatric: She has a normal mood and affect. Her speech is normal and behavior is normal. Judgment and thought content normal. Cognition and memory are normal.    ED Course  Procedures (including critical care time)  Labs Reviewed  WET PREP, GENITAL - Abnormal; Notable for the following:    Clue Cells Wet Prep HPF POC FEW (*)     WBC, Wet Prep HPF POC MODERATE (*)     All other components within normal limits  POCT PREGNANCY, URINE  GC/CHLAMYDIA PROBE AMP, GENITAL  URINALYSIS, DIPSTICK ONLY   No results found.   1. Vaginal discharge   2. DUB (dysfunctional uterine bleeding)       MDM  Await gc/ct results.  rx for flagyl.  Follow up with pcp or gyn provider as needed.        Johnsie Kindred, NP 08/01/12 1422

## 2012-07-31 NOTE — ED Notes (Signed)
Patient is rest room obtaining urine specimen

## 2012-08-01 LAB — GC/CHLAMYDIA PROBE AMP, GENITAL: Chlamydia, DNA Probe: NEGATIVE

## 2012-08-01 NOTE — ED Provider Notes (Signed)
Medical screening examination/treatment/procedure(s) were performed by resident physician or non-physician practitioner and as supervising physician I was immediately available for consultation/collaboration.   Tatym Schermer DOUGLAS MD.    Mckay Brandt D Daphnie Venturini, MD 08/01/12 1446 

## 2012-08-02 NOTE — ED Notes (Signed)
GC/Chlamydia neg., Wet prep: Few clue cells, mod. WBC's.  Pt. adequately treated with Flagyl. Vassie Moselle 08/02/2012

## 2012-09-27 DIAGNOSIS — N83209 Unspecified ovarian cyst, unspecified side: Secondary | ICD-10-CM

## 2012-09-27 HISTORY — DX: Unspecified ovarian cyst, unspecified side: N83.209

## 2013-01-11 ENCOUNTER — Other Ambulatory Visit: Payer: Self-pay | Admitting: Internal Medicine

## 2013-01-11 DIAGNOSIS — K219 Gastro-esophageal reflux disease without esophagitis: Secondary | ICD-10-CM

## 2013-01-18 ENCOUNTER — Ambulatory Visit
Admission: RE | Admit: 2013-01-18 | Discharge: 2013-01-18 | Disposition: A | Discharge status: Home / Self Care | Payer: Medicaid Other | Source: Ambulatory Visit | Attending: Internal Medicine | Admitting: Internal Medicine

## 2013-01-18 DIAGNOSIS — K219 Gastro-esophageal reflux disease without esophagitis: Secondary | ICD-10-CM

## 2013-01-25 ENCOUNTER — Emergency Department (HOSPITAL_COMMUNITY)
Admission: EM | Admit: 2013-01-25 | Discharge: 2013-01-25 | Disposition: A | Discharge status: Home / Self Care | Payer: Medicaid Other | Attending: Emergency Medicine | Admitting: Emergency Medicine

## 2013-01-25 ENCOUNTER — Encounter (HOSPITAL_COMMUNITY): Payer: Self-pay | Admitting: Emergency Medicine

## 2013-01-25 DIAGNOSIS — J020 Streptococcal pharyngitis: Secondary | ICD-10-CM | POA: Insufficient documentation

## 2013-01-25 MED ORDER — PENICILLIN G BENZATHINE & PROC 1200000 UNIT/2ML IM SUSP
1.2000 10*6.[IU] | Freq: Once | INTRAMUSCULAR | Status: AC
  Administered 2013-01-25: 1.2 10*6.[IU] via INTRAMUSCULAR
  Filled 2013-01-25: qty 2

## 2013-01-25 NOTE — ED Provider Notes (Signed)
History     CSN: [id number]  Arrival date & time 01/25/13  0248   First MD Initiated Contact with Patient 01/25/13 0310      Chief Complaint  Patient presents with  . Sore Throat    (Consider location/radiation/quality/duration/timing/severity/associated sxs/prior treatment) HPI Comments: Veronica Raymond is a 27 y.o. female patient with no significant past medical history presents emergency department complaining of sore throat.  Onset of symptoms began yesterday and are rated a 6/10 in severity.  Symptoms are worsened with swallowing.  Patient denies any inability to swallow liquids, fevers, night sweats or chills.  Patient did have a just been diagnosed this morning with strep throat.  No other complaints this time.  The history is provided by the patient.    No past medical history on file.  Past Surgical History  Procedure Laterality Date  . Adenoidectomy    . Tympanostomy tube placement      No family history on file.  History  Substance Use Topics  . Smoking status: Never Smoker   . Smokeless tobacco: Not on file  . Alcohol Use: No    OB History   Grav Para Term Preterm Abortions TAB SAB Ect Mult Living                  Review of Systems  Constitutional: Negative for fever, diaphoresis and activity change.  HENT: Positive for sore throat. Negative for congestion and neck pain.   Respiratory: Negative for cough.   Genitourinary: Negative for dysuria.  Musculoskeletal: Negative for myalgias.  Skin: Negative for color change and wound.  Neurological: Negative for headaches.  All other systems reviewed and are negative.    Allergies  Iodinated diagnostic agents  Home Medications   Current Outpatient Rx  Name  Route  Sig  Dispense  Refill  . DM-Doxylamine-Acetaminophen (TYLENOL COUGH/SORE THROAT) 15-6.25-500 MG/15ML MISC   Oral   Take by mouth every 6 (six) hours as needed (sore throat and cough).         . HEXYLRESORCINOL, ANTISEPTIC, (SUCRETS)  2.4 MG LOZG   Mouth/Throat   Use as directed 1 lozenge in the mouth or throat every 2 (two) hours as needed (sore throat and cough).           BP 106/66  Temp(Src) 98.7 F (37.1 C) (Oral)  Resp 18  SpO2 97%  LMP 01/17/2013  Physical Exam  Nursing note and vitals reviewed. Constitutional: She is oriented to person, place, and time. She appears well-developed and well-nourished. No distress.  HENT:  Head: Normocephalic and atraumatic. No trismus in the jaw.  Right Ear: Tympanic membrane, external ear and ear canal normal.  Left Ear: Tympanic membrane, external ear and ear canal normal.  Nose: Nose normal. No rhinorrhea. Right sinus exhibits no maxillary sinus tenderness and no frontal sinus tenderness. Left sinus exhibits no maxillary sinus tenderness and no frontal sinus tenderness.  Mouth/Throat: Uvula is midline and mucous membranes are normal. Normal dentition. No dental abscesses or edematous. Posterior oropharyngeal edema present. No oropharyngeal exudate, posterior oropharyngeal erythema or tonsillar abscesses.  No submental edema, tongue not elevated, no trismus. No impending airway obstruction; Pt able to speak full sentences, swallow intact, no drooling, stridor, or tonsillar/uvula displacement. No palatal petechia  Eyes: Conjunctivae are normal.  Neck: Trachea normal, normal range of motion and full passive range of motion without pain. Neck supple. No rigidity. Normal range of motion present. No Brudzinski's sign noted.  Flexion and extension of neck without  pain or difficulty. Able to breath without difficulty in extension.  Cardiovascular: Normal rate and regular rhythm.   Pulmonary/Chest: Effort normal and breath sounds normal. No stridor. No respiratory distress. She has no wheezes.  Abdominal: Soft. There is no tenderness.  No obvious evidence of splenomegaly. Non ttp.   Musculoskeletal: Normal range of motion.  Lymphadenopathy:       Head (right side): No  preauricular and no posterior auricular adenopathy present.       Head (left side): No preauricular and no posterior auricular adenopathy present.    She has cervical adenopathy.  Neurological: She is alert and oriented to person, place, and time.  Skin: Skin is warm and dry. No rash noted. She is not diaphoretic.  Psychiatric: She has a normal mood and affect.    ED Course  Procedures (including critical care time)  Labs Reviewed - No data to display No results found.   No diagnosis found.    MDM  Pt w sick contacts dx with strep presenting w mild cervical lymphadenopathy & dysphagia; diagnosis of strep. Treated in the Ed with  PCN IM.  Pt does not appear dehydrated, discussed importance of water rehydration. Presentation non concerning for PTA or infxn spread to soft tissue. No trismus or uvula deviation. Specific return precautions discussed. Pt able to drink water in ED without difficulty with intact air way. Recommended PCP follow up.          Jaci Carrel, [place] 01/25/13 [phone]

## 2013-01-25 NOTE — ED Notes (Signed)
Pt reports throat pain since Monday. Some redness noted upon assessment, no white patches noted. No tenderness upon palpation of neck/face, slight swelling of lymph nodes. No fever noted.

## 2013-01-25 NOTE — ED Notes (Signed)
Patient said she started having a sore throat and took tylenol for the pain.  The patient said that did not work so she started taking chloraseptic spray and sucrets and that did not work. Patient called her PCP and they did not have any appointments available for her to be seen.  The patient said her pain got worse so she decided to come in and be seen in the ED.  Patient rates her pain a 6/10.

## 2013-01-25 NOTE — ED Provider Notes (Signed)
Medical screening examination/treatment/procedure(s) were performed by non-physician practitioner and as supervising physician I was immediately available for consultation/collaboration.  Olivia Mackie, MD 01/25/13 [phone]

## 2013-01-25 NOTE — ED Notes (Signed)
Pt given penicillin injection, RN instructed pt to wait 20 min before leaving to observe for reaction.

## 2013-04-23 ENCOUNTER — Emergency Department (HOSPITAL_COMMUNITY)
Admission: EM | Admit: 2013-04-23 | Discharge: 2013-04-23 | Disposition: A | Discharge status: Home / Self Care | Payer: Medicaid Other | Source: Home / Self Care | Attending: Family Medicine | Admitting: Family Medicine

## 2013-04-23 DIAGNOSIS — J069 Acute upper respiratory infection, unspecified: Secondary | ICD-10-CM

## 2013-04-23 NOTE — ED Provider Notes (Signed)
  CSN: [id number]     Arrival date & time 04/23/13  1427 History     First MD Initiated Contact with Patient 04/23/13 1451     Chief Complaint  Patient presents with  . Sore Throat   (Consider location/radiation/quality/duration/timing/severity/associated sxs/prior Treatment) Patient is a 27 y.o. female presenting with pharyngitis. The history is provided by the patient.  Sore Throat This is a new problem. The current episode started more than 2 days ago. The problem has not changed since onset.The symptoms are aggravated by swallowing.    No past medical history on file. Past Surgical History  Procedure Laterality Date  . Adenoidectomy    . Tympanostomy tube placement     No family history on file. History  Substance Use Topics  . Smoking status: Never Smoker   . Smokeless tobacco: Not on file  . Alcohol Use: No   OB History   Grav Para Term Preterm Abortions TAB SAB Ect Mult Living                 Review of Systems  Constitutional: Negative.   HENT: Positive for congestion, sore throat, rhinorrhea and postnasal drip.     Allergies  Iodinated diagnostic agents  Home Medications   Current Outpatient Rx  Name  Route  Sig  Dispense  Refill  . DM-Doxylamine-Acetaminophen (TYLENOL COUGH/SORE THROAT) 15-6.25-500 MG/15ML MISC   Oral   Take by mouth every 6 (six) hours as needed (sore throat and cough).         . HEXYLRESORCINOL, ANTISEPTIC, (SUCRETS) 2.4 MG LOZG   Mouth/Throat   Use as directed 1 lozenge in the mouth or throat every 2 (two) hours as needed (sore throat and cough).          BP 105/60  Pulse 69  Temp(Src) 98.7 F (37.1 C) (Oral)  Resp 16  SpO2 99% Physical Exam  Nursing note and vitals reviewed. Constitutional: She is oriented to person, place, and time. She appears well-developed and well-nourished.  HENT:  Right Ear: External ear normal.  Left Ear: External ear normal.  Mouth/Throat: Oropharynx is clear and moist.  Eyes: Conjunctivae  are normal. Pupils are equal, round, and reactive to light.  Neck: Normal range of motion. Neck supple.  Cardiovascular: Regular rhythm and normal heart sounds.   Pulmonary/Chest: Effort normal and breath sounds normal.  Lymphadenopathy:    She has no cervical adenopathy.  Neurological: She is alert and oriented to person, place, and time.  Skin: Skin is warm and dry.    ED Course   Procedures (including critical care time)  Labs Reviewed  POCT RAPID STREP A ([facility] ONLY)   No results found. 1. URI (upper respiratory infection)     MDM    Linna Hoff, MD 04/23/13 [phone]

## 2013-04-23 NOTE — ED Notes (Signed)
Complaining of sore throat since July 23.  Gargle with salt water and tylenol and longenzes used but no relief.    Patient says she does have the regular cold sx.

## 2013-04-25 LAB — CULTURE, GROUP A STREP

## 2013-05-04 DIAGNOSIS — N750 Cyst of Bartholin's gland: Secondary | ICD-10-CM

## 2013-05-04 HISTORY — DX: Cyst of Bartholin's gland: N75.0

## 2013-05-17 ENCOUNTER — Emergency Department (HOSPITAL_COMMUNITY)
Admission: EM | Admit: 2013-05-17 | Discharge: 2013-05-17 | Disposition: A | Discharge status: Home / Self Care | Payer: Medicaid Other | Source: Home / Self Care

## 2013-05-17 ENCOUNTER — Encounter (HOSPITAL_COMMUNITY): Payer: Self-pay | Admitting: *Deleted

## 2013-05-17 DIAGNOSIS — G43909 Migraine, unspecified, not intractable, without status migrainosus: Secondary | ICD-10-CM

## 2013-05-17 LAB — POCT I-STAT, CHEM 8
Chloride: 107 mEq/L (ref 96–112)
HCT: 44 % (ref 36.0–46.0)
Hemoglobin: 15 g/dL (ref 12.0–15.0)
Potassium: 3.7 mEq/L (ref 3.5–5.1)
Sodium: 142 mEq/L (ref 135–145)

## 2013-05-17 NOTE — ED Provider Notes (Signed)
Veronica Raymond is a 27 y.o. female who presents to Urgent Care today for migraine present off-and-on for the last several weeks. Patient has a history of chronic migraine currently on Topamax and Imitrex. Her mother is been worsening recently and her Topamax was recently increased from 25 to 50 mg. She had a migraine this morning that has resolved since she has been in the clinic.  She notes a sore and a pounding headache. Additionally the last several days she's had some numbness in her feet and some blurry vision. She denies any loss of coordination weakness difficulty walking or speaking. She feels well otherwise. No nausea vomiting or diarrhea.    PMH reviewed. Migraines History  Substance Use Topics  . Smoking status: Never Smoker   . Smokeless tobacco: Not on file  . Alcohol Use: No   ROS as above Medications reviewed. No current facility-administered medications for this encounter.   Current Outpatient Prescriptions  Medication Sig Dispense Refill  . SUMAtriptan Succinate (IMITREX PO) Take by mouth.      . Topiramate (TOPAMAX PO) Take by mouth.      . DM-Doxylamine-Acetaminophen (TYLENOL COUGH/SORE THROAT) 15-6.25-500 MG/15ML MISC Take by mouth every 6 (six) hours as needed (sore throat and cough).      . HEXYLRESORCINOL, ANTISEPTIC, (SUCRETS) 2.4 MG LOZG Use as directed 1 lozenge in the mouth or throat every 2 (two) hours as needed (sore throat and cough).        Exam:  BP 120/59  Pulse 70  Temp(Src) 98.4 F (36.9 C) (Oral)  Resp 16  SpO2 100%  LMP 05/17/2013 Gen: Well NAD HEENT: EOMI,  MMM, PERRLA Lungs: CTABL Nl WOB Heart: RRR no MRG Abd: NABS, NT, ND Exts: Non edematous BL  LE, warm and well perfused.  Neuro: Alert and oriented cranial nerves II through XII are intact normal balance coordination strength and sensation. Reflexes are equal intact bilateral lower extremities. Patient has normal gait.  No results found for this or any previous visit (from the past 24  hour(s)). No results found.  Assessment and Plan: 27 y.o. female with chronic migraines with paresthesia and blurry vision after increasing Topamax.  I think her paresthesias and blurry visions are probably related to increasing the Topamax dose these are common side effects on this medication.  Her neurologic exam is normal and she is not in distress.  I do not think she has a serious intracranial process.  I have advised patient to decrease her Topamax dose to 25 mg and followup with her primary care provider.  Handout provided Discussed warning signs or symptoms. Please see discharge instructions. Patient expresses understanding.      Rodolph Bong, MD 05/17/13 1409

## 2013-05-17 NOTE — ED Notes (Signed)
Pt  Reports   Symptoms  Of    Headache       X  4    Days    With  Some  Nausea   No  Vomiting   She  Reports  Pain is  With  Light         She  Also  Reports  Some  tingling  In  Both  Feet        For  About 4  Days   As  Well as  Slight  Numbness  l  Leg  Yesterday    Pt  Reports  History  Of  Migraines     And  denys   Any  Injury

## 2013-06-13 ENCOUNTER — Emergency Department (HOSPITAL_COMMUNITY)
Admission: EM | Admit: 2013-06-13 | Discharge: 2013-06-13 | Disposition: A | Discharge status: Home / Self Care | Payer: Medicaid Other | Source: Home / Self Care | Attending: Emergency Medicine | Admitting: Emergency Medicine

## 2013-06-13 ENCOUNTER — Encounter (HOSPITAL_COMMUNITY): Payer: Self-pay | Admitting: Emergency Medicine

## 2013-06-13 DIAGNOSIS — J029 Acute pharyngitis, unspecified: Secondary | ICD-10-CM

## 2013-06-13 LAB — POCT RAPID STREP A: Streptococcus, Group A Screen (Direct): NEGATIVE

## 2013-06-13 MED ORDER — PREDNISONE 20 MG PO TABS: 20.0000 mg | ORAL_TABLET | Freq: Two times a day (BID) | ORAL | Status: DC

## 2013-06-13 MED ORDER — AMOXICILLIN 500 MG PO CAPS: 500.0000 mg | ORAL_CAPSULE | Freq: Three times a day (TID) | ORAL | Status: DC

## 2013-06-13 MED ORDER — OXYCODONE-ACETAMINOPHEN 5-325 MG PO TABS: ORAL_TABLET | ORAL | Status: DC

## 2013-06-13 NOTE — ED Provider Notes (Signed)
Chief Complaint:   Chief Complaint  Patient presents with  . Sore Throat    History of Present Illness:   Veronica Raymond is a 27 year old female who's had a three-day history of a sore throat, mostly on the right, myalgias, headache, chills, felt feverish, and nausea. Her daughter has been sick with a viral syndrome. Her headache feels like a migraine type headache he she's had migraines before and has Imitrex at home but has not used it. She denies any nasal congestion, rhinorrhea, stiff neck, coughing, wheezing, vomiting, or diarrhea.  Review of Systems:  Other than noted above, the patient denies any of the following symptoms: Systemic:  No fevers, chills, sweats, weight loss or gain, fatigue, or tiredness. Eye:  No redness or discharge. ENT:  No ear pain, drainage, headache, nasal congestion, drainage, sinus pressure, difficulty swallowing, or sore throat. Neck:  No neck pain or swollen glands. Lungs:  No cough, sputum production, hemoptysis, wheezing, chest tightness, shortness of breath or chest pain. GI:  No abdominal pain, nausea, vomiting or diarrhea.  PMFSH:  Past medical history, family history, social history, meds, and allergies were reviewed.  Physical Exam:   Vital signs:  BP 117/65  Pulse 115  Temp(Src) 100.7 F (38.2 C) (Oral)  Resp 16  SpO2 100%  LMP 06/13/2013 General:  Alert and oriented.  In no distress.  Skin warm and dry. Eye:  No conjunctival injection or drainage. Lids were normal. ENT:  TMs and canals were normal, without erythema or inflammation.  Nasal mucosa was clear and uncongested, without drainage.  Mucous membranes were moist.  Pharynx was erythematous with cobblestoning, no exudate.  There were no oral ulcerations or lesions. Neck:  Supple, no adenopathy, tenderness or mass. Lungs:  No respiratory distress.  Lungs were clear to auscultation, without wheezes, rales or rhonchi.  Breath sounds were clear and equal bilaterally.  Heart:  Regular rhythm,  without gallops, murmers or rubs. Skin:  Clear, warm, and dry, without rash or lesions.  Labs:   Results for orders placed during the hospital encounter of 06/13/13  POCT RAPID STREP A ([facility] ONLY)      Result Value Range   Streptococcus, Group A Screen (Direct) NEGATIVE  NEGATIVE    Assessment:  The encounter diagnosis was Pharyngitis.  Fever and severe sore throat makes strep more likely, will treat, pending results of the formal culture. Offered her a migraine cocktail, but patient declines. She would like something for the pain in the throat and headache.  Plan:   1.  Meds:  The following meds were prescribed:   Discharge Medication List as of 06/13/2013  4:10 PM    START taking these medications   Details  amoxicillin (AMOXIL) 500 MG capsule Take 1 capsule (500 mg total) by mouth 3 (three) times daily., Starting 06/13/2013, Until Discontinued, Normal    oxyCODONE-acetaminophen (PERCOCET) 5-325 MG per tablet 1 to 2 tablets every 6 hours as needed for pain., Print    predniSONE (DELTASONE) 20 MG tablet Take 1 tablet (20 mg total) by mouth 2 (two) times daily., Starting 06/13/2013, Until Discontinued, Normal        2.  Patient Education/Counseling:  The patient was given appropriate handouts, self care instructions, and instructed in symptomatic relief.  Suggested hot saline gargles and throat lozenges.  3.  Follow up:  The patient was told to follow up if no better in 3 to 4 days, if becoming worse in any way, and given some red  flag symptoms such as difficulty swallowing or breathing which would prompt immediate return.  Follow up here if necessary.      Reuben Likes, MD 06/13/13 [phone]

## 2013-06-13 NOTE — ED Notes (Signed)
Pt c/o sore throat, fatigue, fever, body aches, muscle aches x 2 days. Pt denies nasal congestion and drainage. Pt took Tylenol PM with some relief. Pt current temp 102.6 and has not had any medication for fever today. Jan Ranson, SMA

## 2013-06-15 LAB — CULTURE, GROUP A STREP

## 2013-06-28 NOTE — ED Notes (Signed)
Throat culture: Group A strep (S. Pyogenes).  Pt. adequately treated with Amoxicillin. Veronica Raymond

## 2013-07-02 ENCOUNTER — Telehealth (HOSPITAL_COMMUNITY): Payer: Self-pay | Admitting: *Deleted

## 2013-07-02 NOTE — ED Notes (Signed)
I called pt. Pt. verified x 2 and given results.  Pt. told she was adequately treated with Amoxicillin for Strep. If anyone she was in contact with, gets the same symptoms, advise them to get checked for strep. Pt. states she is better. Veronica Raymond 07/02/2013

## 2013-07-06 ENCOUNTER — Encounter: Payer: Self-pay | Admitting: Neurology

## 2013-07-09 ENCOUNTER — Encounter: Payer: Self-pay | Admitting: Neurology

## 2013-07-09 ENCOUNTER — Encounter (INDEPENDENT_AMBULATORY_CARE_PROVIDER_SITE_OTHER): Payer: Self-pay

## 2013-07-09 ENCOUNTER — Ambulatory Visit (INDEPENDENT_AMBULATORY_CARE_PROVIDER_SITE_OTHER): Payer: Medicaid Other | Admitting: Neurology

## 2013-07-09 VITALS — BP 106/42 | HR 77 | Ht 65.0 in | Wt 171.0 lb

## 2013-07-09 DIAGNOSIS — G43009 Migraine without aura, not intractable, without status migrainosus: Secondary | ICD-10-CM

## 2013-07-09 HISTORY — DX: Migraine without aura, not intractable, without status migrainosus: G43.009

## 2013-07-09 MED ORDER — RIZATRIPTAN BENZOATE 10 MG PO TBDP: 10.0000 mg | ORAL_TABLET | Freq: Three times a day (TID) | ORAL | Status: DC | PRN

## 2013-07-09 MED ORDER — KETOROLAC TROMETHAMINE 10 MG PO TABS: 10.0000 mg | ORAL_TABLET | Freq: Four times a day (QID) | ORAL | Status: DC | PRN

## 2013-07-09 NOTE — Progress Notes (Signed)
Reason for visit: Migraine headache  Veronica Raymond is a 27 y.o. female  History of present illness:  Veronica Raymond is a 27 year old right-handed black female with a history of migraine headaches dating back 3 years. The patient indicated that initially, her headaches were only once or twice a year, but the patient has had 7 headaches within the last 10 months. The patient indicates that chocolate, bright lights, and sitting too long may bring on the headache. The headaches start with flashing lights in the visual fields bilaterally, and then the vision gets black, and then the patient will get a headache. The visual disturbance lasts about 5 minutes before the headache starts. The patient may occasionally have some tingly sensations down the left side. The patient had nausea without vomiting. The patient also may have some tingling in both feet. The patient indicates that the headache may last 5 hours, and then resolve. The patient was given Topamax taking 25 mg twice daily, then going to 50 mg twice daily. The patient could not tolerate the side effects of the tingling and blurred vision on this medication. The patient is off Topamax at this point. The patient was given Imitrex to take for the headache, but this has not been effective. The patient does report some photophobia and phonophobia with headache. The patient is sent to this office for an evaluation.  Past Medical History  Diagnosis Date  . Bartholin gland cyst 05/04/2013  . GERD (gastroesophageal reflux disease)   . Allergic rhinitis   . Migraine without aura, without mention of intractable migraine without mention of status migrainosus 07/09/2013    Past Surgical History  Procedure Laterality Date  . Adenoidectomy    . Tympanostomy tube placement      Family History  Problem Relation Age of Onset  . Hypertension Mother   . Heart disease Father     Social history:  reports that she has never smoked. She has never used  smokeless tobacco. She reports that she drinks alcohol. She reports that she does not use illicit drugs.  Medications:  Current Outpatient Prescriptions on File Prior to Visit  Medication Sig Dispense Refill  . HEXYLRESORCINOL, ANTISEPTIC, (SUCRETS) 2.4 MG LOZG Use as directed 1 lozenge in the mouth or throat every 2 (two) hours as needed (sore throat and cough).      Marland Kitchen oxyCODONE-acetaminophen (PERCOCET) 5-325 MG per tablet 1 to 2 tablets every 6 hours as needed for pain.  20 tablet  0   No current facility-administered medications on file prior to visit.      Allergies  Allergen Reactions  . Iodinated Diagnostic Agents Hives    ROS:  Out of a complete 14 system review of symptoms, the patient complains only of the following symptoms, and all other reviewed systems are negative.  Weight gain, ringing in the ears Blurred vision, loss of vision Shortness of breath, snoring Headache, numbness, dizziness Too much sleep  Blood pressure 106/42, pulse 77, height 5\' 5"  (1.651 m), weight 171 lb (77.565 kg), last menstrual period 06/13/2013.  Physical Exam  General: The patient is alert and cooperative at the time of the examination.  Head: Pupils are equal, round, and reactive to light. Discs are flat bilaterally.  Neck: The neck is supple, no carotid bruits are noted.  Respiratory: The respiratory examination is clear.  Cardiovascular: The cardiovascular examination reveals a regular rate and rhythm, no obvious murmurs or rubs are noted.  Neuromuscular: The patient has good range of movement  of the cervical spine.  Skin: Extremities are without significant edema.  Neurologic Exam  Mental status:  Cranial nerves: Facial symmetry is present. There is good sensation of the face to pinprick and soft touch bilaterally. The strength of the facial muscles and the muscles to head turning and shoulder shrug are normal bilaterally. Speech is well enunciated, no aphasia or dysarthria is  noted. Extraocular movements are full. Visual fields are full.  Motor: The motor testing reveals 5 over 5 strength of all 4 extremities. Good symmetric motor tone is noted throughout.  Sensory: Sensory testing is intact to pinprick, soft touch, vibration sensation, and position sense on all 4 extremities. No evidence of extinction is noted.  Coordination: Cerebellar testing reveals good finger-nose-finger and heel-to-shin bilaterally.  Gait and station: Gait is normal. Tandem gait is normal. Romberg is negative. No drift is seen.  Reflexes: Deep tendon reflexes are symmetric and normal bilaterally. Toes are downgoing bilaterally.   Assessment/Plan:  1. Migraine headache, classic migraine  The patient will be placed on Maxalt to take if needed, along with ketorolac. The patient needs to take medications early in course of the headache. The current headache frequency does not warrant the use of a daily preventative medication. The patient will followup through this office in 6 months.  Veronica Palau MD 07/09/2013 7:21 PM  [facility] [address] [place], [place] [address]  Phone [phone] Fax [phone]

## 2013-07-13 ENCOUNTER — Telehealth: Payer: Self-pay

## 2013-07-13 NOTE — Telephone Encounter (Signed)
Patient called, left message regarding the medication for her migraines.  She was unsure if Imitrex was being prescribed or something else.  Said she picked up the medication we prescribed, but has not taken any yet.  By viewing OV note, it says Imitrex was not effective.  Maxalt was sent to the pharmacy for HA.  I called the patient back, got no answer.  Left message.

## 2013-08-16 ENCOUNTER — Other Ambulatory Visit (HOSPITAL_COMMUNITY)
Admission: RE | Admit: 2013-08-16 | Discharge: 2013-08-16 | Disposition: A | Discharge status: Home / Self Care | Payer: Medicaid Other | Source: Ambulatory Visit | Attending: Family Medicine | Admitting: Family Medicine

## 2013-08-16 ENCOUNTER — Encounter (HOSPITAL_COMMUNITY): Payer: Self-pay | Admitting: Emergency Medicine

## 2013-08-16 ENCOUNTER — Encounter (HOSPITAL_COMMUNITY): Payer: Self-pay | Admitting: *Deleted

## 2013-08-16 ENCOUNTER — Emergency Department (HOSPITAL_COMMUNITY)
Admission: EM | Admit: 2013-08-16 | Discharge: 2013-08-16 | Disposition: A | Discharge status: Home / Self Care | Payer: Medicaid Other | Source: Home / Self Care

## 2013-08-16 ENCOUNTER — Inpatient Hospital Stay (HOSPITAL_COMMUNITY): Payer: Medicaid Other

## 2013-08-16 ENCOUNTER — Inpatient Hospital Stay (HOSPITAL_COMMUNITY)
Admission: AD | Admit: 2013-08-16 | Discharge: 2013-08-16 | Disposition: A | Discharge status: Home / Self Care | Payer: Medicaid Other | Source: Ambulatory Visit | Attending: Obstetrics & Gynecology | Admitting: Obstetrics & Gynecology

## 2013-08-16 DIAGNOSIS — Z3490 Encounter for supervision of normal pregnancy, unspecified, unspecified trimester: Secondary | ICD-10-CM

## 2013-08-16 DIAGNOSIS — Z3201 Encounter for pregnancy test, result positive: Secondary | ICD-10-CM

## 2013-08-16 DIAGNOSIS — O99891 Other specified diseases and conditions complicating pregnancy: Secondary | ICD-10-CM | POA: Insufficient documentation

## 2013-08-16 DIAGNOSIS — O9989 Other specified diseases and conditions complicating pregnancy, childbirth and the puerperium: Secondary | ICD-10-CM

## 2013-08-16 DIAGNOSIS — N760 Acute vaginitis: Secondary | ICD-10-CM | POA: Insufficient documentation

## 2013-08-16 DIAGNOSIS — N949 Unspecified condition associated with female genital organs and menstrual cycle: Secondary | ICD-10-CM | POA: Insufficient documentation

## 2013-08-16 DIAGNOSIS — Z1389 Encounter for screening for other disorder: Secondary | ICD-10-CM

## 2013-08-16 DIAGNOSIS — Z113 Encounter for screening for infections with a predominantly sexual mode of transmission: Secondary | ICD-10-CM | POA: Insufficient documentation

## 2013-08-16 DIAGNOSIS — N898 Other specified noninflammatory disorders of vagina: Secondary | ICD-10-CM

## 2013-08-16 DIAGNOSIS — R1032 Left lower quadrant pain: Secondary | ICD-10-CM | POA: Insufficient documentation

## 2013-08-16 DIAGNOSIS — R109 Unspecified abdominal pain: Secondary | ICD-10-CM

## 2013-08-16 LAB — POCT URINALYSIS DIP (DEVICE)
Hgb urine dipstick: NEGATIVE
Nitrite: NEGATIVE
Protein, ur: NEGATIVE mg/dL
pH: 6.5 (ref 5.0–8.0)

## 2013-08-16 LAB — CBC
HCT: 36.2 % (ref 36.0–46.0)
Hemoglobin: 12.5 g/dL (ref 12.0–15.0)
MCH: 30.2 pg (ref 26.0–34.0)
MCHC: 34.5 g/dL (ref 30.0–36.0)
MCV: 87.4 fL (ref 78.0–100.0)
RBC: 4.14 MIL/uL (ref 3.87–5.11)

## 2013-08-16 NOTE — MAU Provider Note (Signed)
History     CSN: [id number]  Arrival date and time: 08/16/13 1610   None     Chief Complaint  Patient presents with  . Abdominal Pain   HPI This is a 27 y.o. female at [redacted]w[redacted]d who presents for an ultrasound followup to rule out ectopic due to LLQ pain. Was seen at Urgent Care and had pelvic exam and quant HCG.   RN Note:  Pt reports has had a sharp pain in her LLQ x 3 days Pain comes and goes and gets worse after eating . Went to urgent care and told to come here. Pt is pregnant.     Urgent Care note: 1.  Left lateral abdominal pain   2.  Vaginal discharge in pregnancy, first trimester   3.  Positive pregnancy test   Due to the L lateral abdominal pain and a positive preg test will send to the [facility] for eval, U/S.  She also has a vag discharge. Will hold off tx for now until results are back tomorrow.  Hayden Rasmussen, NP  08/16/13 1542   OB History   Grav Para Term Preterm Abortions TAB SAB Ect Mult Living   1               Past Medical History  Diagnosis Date  . Bartholin gland cyst 05/04/2013  . GERD (gastroesophageal reflux disease)   . Allergic rhinitis   . Migraine without aura, without mention of intractable migraine without mention of status migrainosus 07/09/2013    Past Surgical History  Procedure Laterality Date  . Adenoidectomy    . Tympanostomy tube placement      Family History  Problem Relation Age of Onset  . Hypertension Mother   . Heart disease Father     History  Substance Use Topics  . Smoking status: Never Smoker   . Smokeless tobacco: Never Used  . Alcohol Use: Yes     Comment: Occasional    Allergies:  Allergies  Allergen Reactions  . Iodinated Diagnostic Agents Hives    Prescriptions prior to admission  Medication Sig Dispense Refill  . oxyCODONE-acetaminophen (PERCOCET) 5-325 MG per tablet 1 to 2 tablets every 6 hours as needed for pain.  20 tablet  0  . [DISCONTINUED] ketorolac (TORADOL) 10 MG tablet Take 1 tablet  (10 mg total) by mouth every 6 (six) hours as needed for pain.  20 tablet  1  . [DISCONTINUED] rizatriptan (MAXALT-MLT) 10 MG disintegrating tablet Take 1 tablet (10 mg total) by mouth 3 (three) times daily as needed for migraine. May repeat in 2 hours if needed  12 tablet  3    Review of Systems  Constitutional: Negative for fever, chills and malaise/fatigue.  Gastrointestinal: Positive for abdominal pain (off and on, LLQ). Negative for nausea, vomiting, diarrhea and constipation.  Genitourinary: Negative for dysuria.  Neurological: Negative for dizziness.   Physical Exam   Blood pressure 127/63, pulse 88, temperature 98.5 F (36.9 C), temperature source Oral, height 5\' 5"  (1.651 m), weight 77.747 kg (171 lb 6.4 oz), last menstrual period 07/09/2013.  Physical Exam  Constitutional: She is oriented to person, place, and time. She appears well-developed and well-nourished. No distress.  Cardiovascular: Normal rate.   Respiratory: Effort normal.  GI: Soft. There is no tenderness.  Musculoskeletal: Normal range of motion.  Neurological: She is alert and oriented to person, place, and time.  Skin: Skin is warm and dry.  Psychiatric: She has a normal mood and affect.  MAU Course  Procedures  MDM US Ob Transvaginal  08/16/2013   CLINICAL DATA:  Left-sided pelvic pain.  Pregnant.  EXAM: OBSTETRIC <14 WK [name] AND TRANSVAGINAL OB US  TECHNIQUE: Both transabdominal and transvaginal ultrasound examinations were performed for complete evaluation of the gestation as well as the maternal uterus, adnexal regions, and pelvic cul-de-sac. Transvaginal technique was performed to assess early pregnancy.  COMPARISON:  None.  FINDINGS: Intrauterine gestational sac: Visualized/normal in shape.  Yolk sac:  Embryo:  None.  Cardiac Activity: N/A  Heart Rate:  N/A bpm  MSD:  13.2  mm   6 w   1  d  CRL:     mm    w  d                    [name] EDC: 04/10/2014  Maternal uterus/adnexae:  No subchorionic hemorrhage.   Normal right ovary.  Normal left ovary.  Small amount of free pelvic fluid.    IMPRESSION:  Intrauterine gestational sac estimated at 6 weeks and 1 day. There is a small yolk sac but no embryo is identified.  No subchorionic hemorrhage.  Normal ovaries.  Probable early intrauterine gestational sac with a small yolk sac but no fetal pole or cardiac activity yet visualized.   Recommend follow-up quantitative B-HCG levels and follow-up US in 14 days to confirm and assess viability. This recommendation follows SRU consensus guidelines: Diagnostic Criteria for Nonviable Pregnancy Early in the First Trimester. Malva Limes Med 2013; 409:8119-14.     Electronically Signed   By: Loralie Champagne M.D.   On: 08/16/2013 17:45    Assessment and Plan  A:  SIUP at [redacted]w[redacted]d       Yolk sac seen  P:  Discharge home       Plans care at [facility]       Encouraged her to call them in AM and schedule followup US as recommended above  [facility] 08/16/2013, 6:04 PM

## 2013-08-16 NOTE — MAU Note (Addendum)
Pt reports has had a sharp pain in her LLQ x 3 days Pain comes and goes and gets worse after eating . Went to urgent care and told to come here. Pt is pregnant.

## 2013-08-16 NOTE — ED Notes (Signed)
Sharp abdominal pain, onset 3 days

## 2013-08-16 NOTE — ED Provider Notes (Signed)
CSN: [id number]     Arrival date & time 08/16/13  1313 History   First MD Initiated Contact with Patient 08/16/13 1453     No chief complaint on file.  (Consider location/radiation/quality/duration/timing/severity/associated sxs/prior Treatment) HPI Comments: C/O pain to the L lateral abdomen for 3 days. Intermittent, temporarily worse with swallowing fluid. Not affected by movement, not tender.  Vaginal D/C for 2 weeks. Denies pelvic pain.    Past Medical History  Diagnosis Date  . Bartholin gland cyst 05/04/2013  . GERD (gastroesophageal reflux disease)   . Allergic rhinitis   . Migraine without aura, without mention of intractable migraine without mention of status migrainosus 07/09/2013   Past Surgical History  Procedure Laterality Date  . Adenoidectomy    . Tympanostomy tube placement     Family History  Problem Relation Age of Onset  . Hypertension Mother   . Heart disease Father    History  Substance Use Topics  . Smoking status: Never Smoker   . Smokeless tobacco: Never Used  . Alcohol Use: Yes     Comment: Occasional   OB History   Grav Para Term Preterm Abortions TAB SAB Ect Mult Living                 Review of Systems  Constitutional: Negative.   Respiratory: Negative.   Cardiovascular: Negative.   Gastrointestinal: Negative for nausea, vomiting, abdominal pain, diarrhea, constipation and rectal pain.  Genitourinary: Positive for vaginal discharge. Negative for dysuria, frequency and pelvic pain.    Allergies  Iodinated diagnostic agents  Home Medications   Current Outpatient Rx  Name  Route  Sig  Dispense  Refill  . HEXYLRESORCINOL, ANTISEPTIC, (SUCRETS) 2.4 MG LOZG   Mouth/Throat   Use as directed 1 lozenge in the mouth or throat every 2 (two) hours as needed (sore throat and cough).         Marland Kitchen ketorolac (TORADOL) 10 MG tablet   Oral   Take 1 tablet (10 mg total) by mouth every 6 (six) hours as needed for pain.   20 tablet   1   .  oxyCODONE-acetaminophen (PERCOCET) 5-325 MG per tablet      1 to 2 tablets every 6 hours as needed for pain.   20 tablet   0   . rizatriptan (MAXALT-MLT) 10 MG disintegrating tablet   Oral   Take 1 tablet (10 mg total) by mouth 3 (three) times daily as needed for migraine. May repeat in 2 hours if needed   12 tablet   3    BP 116/59  Pulse 76  Temp(Src) 98.3 F (36.8 C) (Oral)  Resp 16  SpO2 100% Physical Exam  Nursing note and vitals reviewed. Constitutional: She is oriented to person, place, and time. She appears well-developed and well-nourished. She appears distressed.  Neck: Normal range of motion. Neck supple.  Cardiovascular: Normal rate.   Pulmonary/Chest: Effort normal.  Abdominal: Soft. She exhibits no distension and no mass. There is no tenderness. There is no rebound and no guarding.  Genitourinary: Vaginal discharge found.  NEFG. Copious amount of thick tan vag discharge,  Cx Left of midline and posterior. Ectocx red, inflamation, friable.  Mild CMT, Mild L adnexal tenderness.        Musculoskeletal: She exhibits no edema and no tenderness.  Neurological: She is alert and oriented to person, place, and time.  Skin: Skin is warm and dry.  Psychiatric: She has a normal mood and affect.  ED Course  Procedures (including critical care time) Labs Review Labs Reviewed  POCT URINALYSIS DIP (DEVICE) - Abnormal; Notable for the following:    Leukocytes, UA TRACE (*)    All other components within normal limits  POCT PREGNANCY, URINE - Abnormal; Notable for the following:    Preg Test, Ur POSITIVE (*)    All other components within normal limits  CERVICOVAGINAL ANCILLARY ONLY   Imaging Review No results found.  Results for orders placed during the hospital encounter of 08/16/13  POCT URINALYSIS DIP (DEVICE)      Result Value Range   Glucose, UA NEGATIVE  NEGATIVE mg/dL   Bilirubin Urine NEGATIVE  NEGATIVE   Ketones, ur NEGATIVE  NEGATIVE mg/dL    Specific Gravity, Urine 1.025  1.005 - 1.030   Hgb urine dipstick NEGATIVE  NEGATIVE   pH 6.5  5.0 - 8.0   Protein, ur NEGATIVE  NEGATIVE mg/dL   Urobilinogen, UA 1.0  0.0 - 1.0 mg/dL   Nitrite NEGATIVE  NEGATIVE   Leukocytes, UA TRACE (*) NEGATIVE  POCT PREGNANCY, URINE      Result Value Range   Preg Test, Ur POSITIVE (*) NEGATIVE     MDM   1. Left lateral abdominal pain   2. Vaginal discharge in pregnancy, first trimester   3. Positive pregnancy test     Due to the L lateral abdominal pain and a positive preg test will send to the [facility] for eval, U/S.  She also has a vag discharge. Will hold off tx for now until results are back tomorrow.   Hayden Rasmussen, NP 08/16/13 [phone]

## 2013-08-16 NOTE — MAU Note (Signed)
Pt states she started feeling pain 3 days ago.

## 2013-08-18 NOTE — ED Provider Notes (Signed)
Medical screening examination/treatment/procedure(s) were performed by a resident physician or non-physician practitioner and as the supervising physician I was immediately available for consultation/collaboration.  Jamyah Folk, MD    Lanorris Kalisz S Uriel Dowding, MD 08/18/13 0838 

## 2013-08-19 NOTE — MAU Provider Note (Signed)
Attestation of Attending Supervision of Advanced Practitioner (CNM/NP): Evaluation and management procedures were performed by the Advanced Practitioner under my supervision and collaboration. I have reviewed the Advanced Practitioner's note and chart, and I agree with the management and plan.  LEGGETT,KELLY H. 4:54 PM

## 2013-09-14 LAB — OB RESULTS CONSOLE ABO/RH: RH TYPE: POSITIVE

## 2013-09-14 LAB — OB RESULTS CONSOLE GC/CHLAMYDIA
Chlamydia: NEGATIVE
Gonorrhea: NEGATIVE

## 2013-09-14 LAB — OB RESULTS CONSOLE RPR: RPR: NONREACTIVE

## 2013-09-14 LAB — OB RESULTS CONSOLE HIV ANTIBODY (ROUTINE TESTING): HIV: NONREACTIVE

## 2013-09-14 LAB — OB RESULTS CONSOLE HEPATITIS B SURFACE ANTIGEN: HEP B S AG: NEGATIVE

## 2013-09-14 LAB — OB RESULTS CONSOLE RUBELLA ANTIBODY, IGM: RUBELLA: IMMUNE

## 2013-09-18 ENCOUNTER — Inpatient Hospital Stay (HOSPITAL_COMMUNITY)
Admission: AD | Admit: 2013-09-18 | Discharge: 2013-09-18 | Disposition: A | Discharge status: Home / Self Care | Payer: Medicaid Other | Source: Ambulatory Visit | Attending: Obstetrics and Gynecology | Admitting: Obstetrics and Gynecology

## 2013-09-18 DIAGNOSIS — Z538 Procedure and treatment not carried out for other reasons: Secondary | ICD-10-CM | POA: Insufficient documentation

## 2013-09-18 NOTE — MAU Provider Note (Signed)
HPI: Ms. Veronica Raymond is a 27 y.o. female G1P0 at [redacted]w[redacted]d who was sent here from [facility]'s office to have a follow up US. She currently denies pain or bleeding.    RN note:  Patient was seen in MAU on 11-20. States she was told to have a follow up ultrasound in 2 weeks but when she went to [facility] 3 days ago and told her she needed to come back to [facility] for a follow up ultrasound because they did not have an appointment. Patient denies having any problems, pain or bleeding. Her next appointment with [facility] is on 1-5.   Objective:. GENERAL: Well-developed, well-nourished female in no acute distress.  HEENT: Normocephalic, atraumatic.   LUNGS: Effort normal HEART: Regular rate  SKIN: Warm, dry and without erythema PSYCH: Normal mood and affect  Filed Vitals:   09/18/13 1552  BP: 115/64  Pulse: 84  Temp: 98.6 F (37 C)  TempSrc: Oral  Resp: 16  Height: 5\' 5"  (1.651 m)  Weight: 78.563 kg (173 lb 3.2 oz)  SpO2: 99%     MDM:  I called Dr. Vincente Poli to inform her that patient was here for [name]; Dr. Vincente Poli spoke to the office manager Glennon Mac who would review the note and call the NP in MAU back.  Betsy from [facility] called back and notified us that there was some confusion regarding the need for patient to have a follow up US. Pt is to go directly to [facility] now for [facility] and patient will schedule a follow [name] with them.   P: Pt is to go to [facility]'s office at this time for blood work and to schedule an [name] per Dr. Merceda Elks, NP 09/18/2013 5:24 PM

## 2013-09-18 NOTE — MAU Note (Signed)
Patient was seen in MAU on 11-20. States she was told to have a follow up ultrasound in 2 weeks but when she went to [facility] 3 days ago and told her she needed to come back to [facility] for a follow up ultrasound because they did not have an appointment. Patient denies having any problems, pain or bleeding. Her next appointment with [facility] is on 1-5.

## 2013-10-21 ENCOUNTER — Encounter (HOSPITAL_COMMUNITY): Payer: Self-pay | Admitting: *Deleted

## 2013-10-21 ENCOUNTER — Inpatient Hospital Stay (HOSPITAL_COMMUNITY)
Admission: AD | Admit: 2013-10-21 | Discharge: 2013-10-21 | Disposition: A | Discharge status: Home / Self Care | Payer: Medicaid Other | Source: Ambulatory Visit | Attending: Obstetrics and Gynecology | Admitting: Obstetrics and Gynecology

## 2013-10-21 DIAGNOSIS — R109 Unspecified abdominal pain: Secondary | ICD-10-CM | POA: Insufficient documentation

## 2013-10-21 DIAGNOSIS — O209 Hemorrhage in early pregnancy, unspecified: Secondary | ICD-10-CM | POA: Insufficient documentation

## 2013-10-21 DIAGNOSIS — M545 Low back pain, unspecified: Secondary | ICD-10-CM | POA: Insufficient documentation

## 2013-10-21 DIAGNOSIS — O4690 Antepartum hemorrhage, unspecified, unspecified trimester: Secondary | ICD-10-CM

## 2013-10-21 LAB — URINALYSIS, ROUTINE W REFLEX MICROSCOPIC
Bilirubin Urine: NEGATIVE
Glucose, UA: NEGATIVE mg/dL
Ketones, ur: NEGATIVE mg/dL
LEUKOCYTES UA: NEGATIVE
NITRITE: NEGATIVE
Protein, ur: NEGATIVE mg/dL
Specific Gravity, Urine: 1.025 (ref 1.005–1.030)
UROBILINOGEN UA: 1 mg/dL (ref 0.0–1.0)
pH: 6 (ref 5.0–8.0)

## 2013-10-21 LAB — URINE MICROSCOPIC-ADD ON

## 2013-10-21 NOTE — MAU Note (Signed)
Pt reports lower abd cramping x 45 minutes, started bleeding about 30 minutes.

## 2013-10-21 NOTE — MAU Note (Signed)
Pt reported cramping starting around 1820 tonight followed by vaginal bleeding "like a period".  Pad assessed.  A scant light brown spotting on pad noted.

## 2013-10-21 NOTE — MAU Provider Note (Signed)
History     CSN: [id number]  Arrival date and time: 10/21/13 4332   First Provider Initiated Contact with Patient 10/21/13 1937      Chief Complaint  Patient presents with  . Vaginal Bleeding  . Abdominal Pain   HPI  Ms. Veronica Raymond is a 28 y.o. female G4P3 at [redacted]w[redacted]d who presents with one episode of bright red vaginal bleeding. Around 1830 pt felt some cramping in her abdomen and had a large gush of bleeding. Pt denies bleeding at this time. Pt does complain of lower back pain and lower abdominal cramping; she currently rates her pain 2/10. At the time of the bleeding patient was lifting laundry baskets and going up the stairs. No recent intercourse; pt had bleeding early on in pregnancy.   OB History   Grav Para Term Preterm Abortions TAB SAB Ect Mult Living   4         3      Past Medical History  Diagnosis Date  . Bartholin gland cyst 05/04/2013  . GERD (gastroesophageal reflux disease)   . Allergic rhinitis   . Migraine without aura, without mention of intractable migraine without mention of status migrainosus 07/09/2013    Past Surgical History  Procedure Laterality Date  . Adenoidectomy    . Tympanostomy tube placement      Family History  Problem Relation Age of Onset  . Hypertension Mother   . Heart disease Father     History  Substance Use Topics  . Smoking status: Never Smoker   . Smokeless tobacco: Never Used  . Alcohol Use: No     Comment: Occasional    Allergies:  Allergies  Allergen Reactions  . Iodinated Diagnostic Agents Hives    Prescriptions prior to admission  Medication Sig Dispense Refill  . Prenatal Vit-Fe Fumarate-FA (PRENATAL MULTIVITAMIN) TABS tablet Take 1 tablet by mouth every morning.       Results for orders placed during the hospital encounter of 10/21/13 (from the past 48 hour(s))  URINALYSIS, ROUTINE W REFLEX MICROSCOPIC     Status: Abnormal   Collection Time    10/21/13  7:05 PM      Result Value Range   Color,  Urine YELLOW  YELLOW   APPearance CLEAR  CLEAR   Specific Gravity, Urine 1.025  1.005 - 1.030   pH 6.0  5.0 - 8.0   Glucose, UA NEGATIVE  NEGATIVE mg/dL   Hgb urine dipstick MODERATE (*) NEGATIVE   Bilirubin Urine NEGATIVE  NEGATIVE   Ketones, ur NEGATIVE  NEGATIVE mg/dL   Protein, ur NEGATIVE  NEGATIVE mg/dL   Urobilinogen, UA 1.0  0.0 - 1.0 mg/dL   Nitrite NEGATIVE  NEGATIVE   Leukocytes, UA NEGATIVE  NEGATIVE  URINE MICROSCOPIC-ADD ON     Status: Abnormal   Collection Time    10/21/13  7:05 PM      Result Value Range   Squamous Epithelial / LPF FEW (*) RARE   WBC, UA 0-2  <3 WBC/hpf   RBC / HPF 0-2  <3 RBC/hpf   Bacteria, UA FEW (*) RARE     Review of Systems  Gastrointestinal: Positive for abdominal pain. Negative for nausea, vomiting, diarrhea and constipation.       Bilateral lower abdominal pain   Genitourinary: Negative for dysuria, urgency, frequency and hematuria.       No vaginal discharge. No vaginal bleeding; currently  No dysuria.   Musculoskeletal: Positive for back pain.   Physical  Exam   Blood pressure 125/58, pulse 84, temperature 98.7 F (37.1 C), temperature source Oral, resp. rate 16, height 5\' 5"  (1.651 m), weight 78.642 kg (173 lb 6 oz), last menstrual period 07/09/2013, SpO2 100.00%.  Physical Exam  Constitutional: She is oriented to person, place, and time. She appears well-developed and well-nourished. No distress.  HENT:  Head: Normocephalic.  Eyes: Pupils are equal, round, and reactive to light.  Neck: Neck supple.  Respiratory: Effort normal.  GI: Soft. She exhibits no distension and no mass. There is no tenderness. There is no rebound and no guarding.  Genitourinary:  Speculum exam: Vagina - Small amount of creamy, pink/brown discharge, no odor Cervix - No contact bleeding, no active bleeding  Bimanual exam: Cervix closed Uterus non tender, normal size Adnexa non tender, no masses bilaterally Chaperone present for exam.    Musculoskeletal: Normal range of motion.  Neurological: She is alert and oriented to person, place, and time.  Skin: Skin is warm. She is not diaphoretic.  Psychiatric: Her behavior is normal.    MAU Course  Procedures None  MDM O positive blood type  +fht by doppler  Consulted with Dr. Corinna Raymond; he would like the patient to be offered an [name] tonight.  21:00 Patient unable to wait for [name]; says she really needs to get home. Precautions given to patient at length   Assessment and Plan   A:  Vaginal bleeding in second trimester; pt unable to stay for [name]  +fht  P: Discharge home in stable condition Return to MAU if bleeding increases/if pain increases Call Dr. Katina Raymond office tomorrow to schedule a follow up appointment Bleeding precautions Pelvic rest.   Veronica Hillock Kestrel Mis, NP  10/21/2013, 8:49 PM

## 2013-10-21 NOTE — Discharge Instructions (Signed)
Pelvic Rest Pelvic rest is sometimes recommended for women when:   The placenta is partially or completely covering the opening of the cervix (placenta previa).  There is bleeding between the uterine wall and the amniotic sac in the first trimester (subchorionic hemorrhage).  The cervix begins to open without labor starting (incompetent cervix, cervical insufficiency).  The labor is too early (preterm labor). HOME CARE INSTRUCTIONS  Do not have sexual intercourse, stimulation, or an orgasm.  Do not use tampons, douche, or put anything in the vagina.  Do not lift anything over 10 pounds (4.5 kg).  Avoid strenuous activity or straining your pelvic muscles. SEEK MEDICAL CARE IF:  You have any vaginal bleeding during pregnancy. Treat this as a potential emergency.  You have cramping pain felt low in the stomach (stronger than menstrual cramps).  You notice vaginal discharge (watery, mucus, or bloody).  You have a low, dull backache.  There are regular contractions or uterine tightening. SEEK IMMEDIATE MEDICAL CARE IF: You have vaginal bleeding and have placenta previa.  Document Released: 01/08/2011 Document Revised: 12/06/2011 Document Reviewed: 01/08/2011 [facility] Patient Information 2014 [place].  Vaginal Bleeding During Pregnancy, Second Trimester A small amount of bleeding (spotting) from the vagina is relatively common in pregnancy. It usually stops on its own. Various things can cause bleeding or spotting in pregnancy. Some bleeding may be related to the pregnancy, and some may not. Sometimes the bleeding is normal and is not a problem. However, bleeding can also be a sign of something serious. Be sure to tell your health care provider about any vaginal bleeding right away. Some possible causes of vaginal bleeding during the second trimester include:  Infection, inflammation, or growths on the cervix.   The placenta may be partially or completely covering the  opening of the cervix inside the uterus (placenta previa).  The placenta may have separated from the uterus (abruption of the placenta).   You may be having early (preterm) labor.   The cervix may not be strong enough to keep a baby inside the uterus (cervical insufficiency).   Tiny cysts may have developed in the uterus instead of pregnancy tissue (molar pregnancy). HOME CARE INSTRUCTIONS  Watch your condition for any changes. The following actions may help to lessen any discomfort you are feeling:  Follow your health care provider's instructions for limiting your activity. If your health care provider orders bed rest, you may need to stay in bed and only get up to use the bathroom. However, your health care provider may allow you to continue light activity.  If needed, make plans for someone to help with your regular activities and responsibilities while you are on bed rest.  Keep track of the number of pads you use each day, how often you change pads, and how soaked (saturated) they are. Write this down.  Do not use tampons. Do not douche.  Do not have sexual intercourse or orgasms until approved by your health care provider.  If you pass any tissue from your vagina, save the tissue so you can show it to your health care provider.  Only take over-the-counter or prescription medicines as directed by your health care provider.  Do not take aspirin because it can make you bleed.  Do not exercise or perform any strenuous activities or heavy lifting without your health care provider's permission.  Keep all follow-up appointments as directed by your health care provider. SEEK MEDICAL CARE IF:  You have any vaginal bleeding during any part  of your pregnancy.  You have cramps or labor pains. SEEK IMMEDIATE MEDICAL CARE IF:   You have severe cramps in your back or belly (abdomen).  You have contractions.  You have a fever, not controlled by medicine.  You have chills.  You  pass large clots or tissue from your vagina.  Your bleeding increases.  You feel lightheaded or weak, or you have fainting episodes.  You are leaking fluid or have a gush of fluid from your vagina. MAKE SURE YOU:  Understand these instructions.  Will watch your condition.  Will get help right away if you are not doing well or get worse. Document Released: 06/23/2005 Document Revised: 07/04/2013 Document Reviewed: 05/21/2013 [facility] Patient Information 2014 [place].

## 2014-01-07 ENCOUNTER — Telehealth: Payer: Self-pay | Admitting: Nurse Practitioner

## 2014-01-07 ENCOUNTER — Ambulatory Visit: Payer: Medicaid Other | Admitting: Nurse Practitioner

## 2014-01-07 NOTE — Telephone Encounter (Signed)
No showed for scheduled appointment 

## 2014-01-17 ENCOUNTER — Other Ambulatory Visit: Payer: Self-pay

## 2014-03-22 ENCOUNTER — Other Ambulatory Visit (HOSPITAL_COMMUNITY): Payer: Self-pay | Admitting: Obstetrics and Gynecology

## 2014-03-22 DIAGNOSIS — O358XX0 Maternal care for other (suspected) fetal abnormality and damage, not applicable or unspecified: Secondary | ICD-10-CM

## 2014-03-28 ENCOUNTER — Encounter (HOSPITAL_COMMUNITY): Payer: Self-pay

## 2014-03-28 ENCOUNTER — Ambulatory Visit (HOSPITAL_COMMUNITY)
Admission: RE | Admit: 2014-03-28 | Discharge: 2014-03-28 | Disposition: A | Discharge status: Home / Self Care | Payer: Medicaid Other | Source: Ambulatory Visit | Attending: Obstetrics and Gynecology | Admitting: Obstetrics and Gynecology

## 2014-03-28 DIAGNOSIS — Z3689 Encounter for other specified antenatal screening: Secondary | ICD-10-CM | POA: Diagnosis not present

## 2014-03-28 DIAGNOSIS — O358XX0 Maternal care for other (suspected) fetal abnormality and damage, not applicable or unspecified: Secondary | ICD-10-CM | POA: Diagnosis present

## 2014-03-28 LAB — OB RESULTS CONSOLE GBS: GBS: NEGATIVE

## 2014-04-04 ENCOUNTER — Encounter (HOSPITAL_COMMUNITY): Payer: Self-pay | Admitting: *Deleted

## 2014-04-04 ENCOUNTER — Telehealth (HOSPITAL_COMMUNITY): Payer: Self-pay | Admitting: *Deleted

## 2014-04-04 NOTE — Telephone Encounter (Signed)
Preadmission screen  

## 2014-04-06 ENCOUNTER — Encounter (HOSPITAL_COMMUNITY): Payer: Self-pay

## 2014-04-06 ENCOUNTER — Inpatient Hospital Stay (HOSPITAL_COMMUNITY)
Admission: AD | Admit: 2014-04-06 | Discharge: 2014-04-08 | DRG: 775 | Disposition: A | Discharge status: Home / Self Care | Payer: Medicaid Other | Source: Ambulatory Visit | Attending: Obstetrics and Gynecology | Admitting: Obstetrics and Gynecology

## 2014-04-06 DIAGNOSIS — O9989 Other specified diseases and conditions complicating pregnancy, childbirth and the puerperium: Principal | ICD-10-CM

## 2014-04-06 DIAGNOSIS — Z833 Family history of diabetes mellitus: Secondary | ICD-10-CM | POA: Diagnosis not present

## 2014-04-06 DIAGNOSIS — O479 False labor, unspecified: Secondary | ICD-10-CM | POA: Diagnosis present

## 2014-04-06 DIAGNOSIS — K219 Gastro-esophageal reflux disease without esophagitis: Secondary | ICD-10-CM | POA: Diagnosis present

## 2014-04-06 DIAGNOSIS — O99892 Other specified diseases and conditions complicating childbirth: Secondary | ICD-10-CM | POA: Diagnosis present

## 2014-04-06 LAB — CBC
HCT: 37.4 % (ref 36.0–46.0)
Hemoglobin: 12.4 g/dL (ref 12.0–15.0)
MCH: 29.8 pg (ref 26.0–34.0)
MCHC: 33.2 g/dL (ref 30.0–36.0)
MCV: 89.9 fL (ref 78.0–100.0)
PLATELETS: 250 10*3/uL (ref 150–400)
RBC: 4.16 MIL/uL (ref 3.87–5.11)
RDW: 13.5 % (ref 11.5–15.5)
WBC: 11.2 10*3/uL — ABNORMAL HIGH (ref 4.0–10.5)

## 2014-04-06 LAB — TYPE AND SCREEN
ABO/RH(D): O POS
ANTIBODY SCREEN: NEGATIVE

## 2014-04-06 MED ORDER — OXYTOCIN BOLUS FROM INFUSION
500.0000 mL | INTRAVENOUS | Status: DC
  Administered 2014-04-06: 500 mL via INTRAVENOUS

## 2014-04-06 MED ORDER — MEDROXYPROGESTERONE ACETATE 150 MG/ML IM SUSP: 150.0000 mg | INTRAMUSCULAR | Status: DC | PRN

## 2014-04-06 MED ORDER — BUTORPHANOL TARTRATE 1 MG/ML IJ SOLN
INTRAMUSCULAR | Status: AC
  Filled 2014-04-06: qty 1

## 2014-04-06 MED ORDER — SENNOSIDES-DOCUSATE SODIUM 8.6-50 MG PO TABS
2.0000 | ORAL_TABLET | ORAL | Status: DC
  Filled 2014-04-06: qty 2

## 2014-04-06 MED ORDER — TETANUS-DIPHTH-ACELL PERTUSSIS 5-2.5-18.5 LF-MCG/0.5 IM SUSP: 0.5000 mL | Freq: Once | INTRAMUSCULAR | Status: DC

## 2014-04-06 MED ORDER — IBUPROFEN 600 MG PO TABS
600.0000 mg | ORAL_TABLET | Freq: Four times a day (QID) | ORAL | Status: DC | PRN
  Administered 2014-04-06: 600 mg via ORAL
  Filled 2014-04-06: qty 1

## 2014-04-06 MED ORDER — DIBUCAINE 1 % RE OINT: 1.0000 "application " | TOPICAL_OINTMENT | RECTAL | Status: DC | PRN

## 2014-04-06 MED ORDER — IBUPROFEN 600 MG PO TABS
600.0000 mg | ORAL_TABLET | Freq: Four times a day (QID) | ORAL | Status: DC
  Administered 2014-04-07 – 2014-04-08 (×5): 600 mg via ORAL
  Filled 2014-04-06 (×6): qty 1

## 2014-04-06 MED ORDER — NALOXONE HCL 0.4 MG/ML IJ SOLN
INTRAMUSCULAR | Status: AC
  Filled 2014-04-06: qty 1

## 2014-04-06 MED ORDER — CITRIC ACID-SODIUM CITRATE 334-500 MG/5ML PO SOLN: 30.0000 mL | ORAL | Status: DC | PRN

## 2014-04-06 MED ORDER — OXYTOCIN 40 UNITS IN LACTATED RINGERS INFUSION - SIMPLE MED
62.5000 mL/h | INTRAVENOUS | Status: DC
  Filled 2014-04-06: qty 1000

## 2014-04-06 MED ORDER — BENZOCAINE-MENTHOL 20-0.5 % EX AERO: 1.0000 "application " | INHALATION_SPRAY | CUTANEOUS | Status: DC | PRN

## 2014-04-06 MED ORDER — BUTORPHANOL TARTRATE 1 MG/ML IJ SOLN
1.0000 mg | Freq: Once | INTRAMUSCULAR | Status: AC
  Administered 2014-04-06: 1 mg via INTRAVENOUS

## 2014-04-06 MED ORDER — HYDROCODONE-ACETAMINOPHEN 5-325 MG PO TABS
1.0000 | ORAL_TABLET | ORAL | Status: DC | PRN
  Administered 2014-04-06: 1 via ORAL
  Administered 2014-04-07 (×2): 2 via ORAL
  Administered 2014-04-07 (×2): 1 via ORAL
  Administered 2014-04-07: 2 via ORAL
  Administered 2014-04-07: 1 via ORAL
  Administered 2014-04-08: 2 via ORAL
  Filled 2014-04-06 (×2): qty 1
  Filled 2014-04-06: qty 2
  Filled 2014-04-06: qty 1
  Filled 2014-04-06: qty 2
  Filled 2014-04-06 (×3): qty 1
  Filled 2014-04-06: qty 2

## 2014-04-06 MED ORDER — PRENATAL MULTIVITAMIN CH
1.0000 | ORAL_TABLET | Freq: Every day | ORAL | Status: DC
  Administered 2014-04-07: 1 via ORAL
  Filled 2014-04-06 (×2): qty 1

## 2014-04-06 MED ORDER — DIPHENHYDRAMINE HCL 25 MG PO CAPS
25.0000 mg | ORAL_CAPSULE | Freq: Four times a day (QID) | ORAL | Status: DC | PRN
Start: 2014-04-06 — End: 2014-04-08

## 2014-04-06 MED ORDER — FLEET ENEMA 7-19 GM/118ML RE ENEM: 1.0000 | ENEMA | Freq: Once | RECTAL | Status: DC

## 2014-04-06 MED ORDER — LACTATED RINGERS IV SOLN
INTRAVENOUS | Status: DC
  Administered 2014-04-06: 20:00:00 via INTRAVENOUS

## 2014-04-06 MED ORDER — LANOLIN HYDROUS EX OINT: TOPICAL_OINTMENT | CUTANEOUS | Status: DC | PRN

## 2014-04-06 MED ORDER — ONDANSETRON HCL 4 MG PO TABS: 4.0000 mg | ORAL_TABLET | ORAL | Status: DC | PRN

## 2014-04-06 MED ORDER — MEASLES, MUMPS & RUBELLA VAC ~~LOC~~ INJ
0.5000 mL | INJECTION | Freq: Once | SUBCUTANEOUS | Status: DC
  Filled 2014-04-06: qty 0.5

## 2014-04-06 MED ORDER — WITCH HAZEL-GLYCERIN EX PADS: 1.0000 "application " | MEDICATED_PAD | CUTANEOUS | Status: DC | PRN

## 2014-04-06 MED ORDER — SIMETHICONE 80 MG PO CHEW: 80.0000 mg | CHEWABLE_TABLET | ORAL | Status: DC | PRN

## 2014-04-06 MED ORDER — LACTATED RINGERS IV SOLN: 500.0000 mL | INTRAVENOUS | Status: DC | PRN

## 2014-04-06 MED ORDER — ONDANSETRON HCL 4 MG/2ML IJ SOLN: 4.0000 mg | INTRAMUSCULAR | Status: DC | PRN

## 2014-04-06 MED ORDER — ONDANSETRON HCL 4 MG/2ML IJ SOLN: 4.0000 mg | Freq: Four times a day (QID) | INTRAMUSCULAR | Status: DC | PRN

## 2014-04-06 MED ORDER — LIDOCAINE HCL (PF) 1 % IJ SOLN
30.0000 mL | INTRAMUSCULAR | Status: DC | PRN
  Filled 2014-04-06: qty 30

## 2014-04-06 NOTE — H&P (Addendum)
TARINI CARRIER is a 28 y.o. female presenting for labor.  Pt reports regular, painful contractions.  NO lof or vb.  Infant noted to have "cysts on the brain" at recent ultrasound"    History OB History   Grav Para Term Preterm Abortions TAB SAB Ect Mult Living   4 3 3       3      Past Medical History  Diagnosis Date  . Bartholin gland cyst 05/04/2013  . GERD (gastroesophageal reflux disease)   . Allergic rhinitis   . Migraine without aura, without mention of intractable migraine without mention of status migrainosus 07/09/2013  . Vaginal Pap smear, abnormal    Past Surgical History  Procedure Laterality Date  . Adenoidectomy    . Tympanostomy tube placement    . Tonsillectomy     Family History: family history includes Alcohol abuse in her father and other; Cancer in her maternal grandfather; Diabetes in her maternal grandmother; Heart disease in her father; Hypertension in her maternal grandmother and mother; Mental retardation in her maternal uncle. Social History:  reports that she has never smoked. She has never used smokeless tobacco. She reports that she does not drink alcohol or use illicit drugs.   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 Exam Physical Exam  Gen - uncomfortable w/  Ctx Abd - gravid, NT Ext - NT AROM - light mec cvx 9/C/0  Prenatal labs: ABO, Rh: O/Positive/-- (12/19 0000) Antibody:   Rubella: Immune (12/19 0000) RPR: Nonreactive (12/19 0000)  HBsAg: Negative (12/19 0000)  HIV: Non-reactive (12/19 0000)  GBS: Negative (07/02  0000)   Assessment/Plan: Labor  Exp mngt Plan for MRI of baby's brain after delivery  Jamien Casanova 04/06/2014, 8:09 PM

## 2014-04-06 NOTE — Progress Notes (Signed)
Report called to Dana RN in BS.  

## 2014-04-06 NOTE — Progress Notes (Signed)
SVD of vigerous female infant w/ apgars of 8,9.  Placenta delivered spontaneous w/ 3VC.   No lacerations  Fundus firm.  EBL 300cc .

## 2014-04-06 NOTE — MAU Note (Signed)
P having ctx on and off all day . Denies SROM or bleeding

## 2014-04-07 LAB — CBC
HEMATOCRIT: 34.4 % — AB (ref 36.0–46.0)
Hemoglobin: 11.6 g/dL — ABNORMAL LOW (ref 12.0–15.0)
MCH: 30.1 pg (ref 26.0–34.0)
MCHC: 33.7 g/dL (ref 30.0–36.0)
MCV: 89.1 fL (ref 78.0–100.0)
Platelets: 243 10*3/uL (ref 150–400)
RBC: 3.86 MIL/uL — ABNORMAL LOW (ref 3.87–5.11)
RDW: 13.6 % (ref 11.5–15.5)
WBC: 10.3 10*3/uL (ref 4.0–10.5)

## 2014-04-07 LAB — RPR

## 2014-04-07 NOTE — Progress Notes (Signed)
Post Partum Day 1 Subjective: no complaints.  Pt really wants BTL.  Has medicaid but has not signed 30 day consent.    Objective: Blood pressure 98/59, pulse 72, temperature 98.3 F (36.8 C), temperature source Oral, resp. rate 16, height 5\' 5"  (1.651 m), weight 87.544 kg (193 lb), last menstrual period 07/08/2013, SpO2 95.00%, unknown if currently breastfeeding.  Physical Exam:  General: alert and cooperative Lochia: appropriate Uterine Fundus: firm Incision: n/a DVT Evaluation: No evidence of DVT seen on physical exam.   Recent Labs  04/06/14 2120 04/07/14 0629  HGB 12.4 11.6*  HCT 37.4 34.4*    Assessment/Plan: Plan for discharge tomorrow. Discussed starting depo or micronor tomorrow at discharge and plan for essure in 6-8wks Would need to sign medicaid consent prior to d/c or in office this week   LOS: 1 day   Amela Handley 04/07/2014, 8:25 AM

## 2014-04-07 NOTE — Lactation Note (Signed)
This note was copied from the chart of Veronica Jelani Vreeland. Lactation Consultation Note  P4, Ex BF, Reviewed hand expression, drops of colostrum expressed. Nipple sensitive.  Mother placed baby in cradle hold.  Encouraged mother to try fb or cross cradle for more head support and depth. Mother relunctant to change positions.  Sucks and swallows observed.  Suggested mother massage breasts while feeding. Mom encouraged to feed baby 8-12 times/24 hours and with feeding cues.  Mom made aware of O/P services, breastfeeding support groups, community resources, and our phone # for post-discharge questions.     Patient Name: Veronica Raymond [id number] Date: 04/07/2014 Reason for consult: Initial assessment   Maternal Data Has patient been taught Hand Expression?: Yes Does the patient have breastfeeding experience prior to this delivery?: Yes  Feeding Feeding Type: Breast Fed  LATCH Score/Interventions Latch: Grasps breast easily, tongue down, lips flanged, rhythmical sucking.  Audible Swallowing: A few with stimulation  Type of Nipple: Everted at rest and after stimulation  Comfort (Breast/Nipple): Soft / non-tender     Hold (Positioning): Assistance needed to correctly position infant at breast and maintain latch.  LATCH Score: 8  Lactation Tools Discussed/Used     Consult Status Consult Status: Follow-up Date: 04/08/14 Follow-up type: In-patient    Vivianne Master [facility] 04/07/2014, 2:16 PM

## 2014-04-08 ENCOUNTER — Inpatient Hospital Stay (HOSPITAL_COMMUNITY): Admission: RE | Admit: 2014-04-08 | Payer: Medicaid Other | Source: Ambulatory Visit

## 2014-04-08 MED ORDER — MEDROXYPROGESTERONE ACETATE 150 MG/ML IM SUSP
150.0000 mg | Freq: Once | INTRAMUSCULAR | Status: DC
  Filled 2014-04-08: qty 1

## 2014-04-08 MED ORDER — NORETHINDRONE 0.35 MG PO TABS
1.0000 | ORAL_TABLET | Freq: Every day | ORAL | Status: DC
Start: 2014-04-08 — End: 2014-08-08

## 2014-04-08 MED ORDER — IBUPROFEN 600 MG PO TABS: 600.0000 mg | ORAL_TABLET | Freq: Four times a day (QID) | ORAL | Status: DC

## 2014-04-08 NOTE — Discharge Instructions (Signed)
Call MD for T>100.4, heavy vaginal bleeding, severe abdominal pain, or respiratory distress.  Call office to schedule postpartum visit in 4-6 weeks.  Pelvic rest x 6 weeks.

## 2014-04-08 NOTE — Progress Notes (Signed)
Post Partum Day 2 Subjective: no complaints, up ad lib, voiding and tolerating PO  Objective: Blood pressure 125/71, pulse 68, temperature 98 F (36.7 C), temperature source Oral, resp. rate 20, height 5\' 5"  (1.651 m), weight 193 lb (87.544 kg), last menstrual period 07/08/2013, SpO2 95.00%, unknown if currently breastfeeding.  Physical Exam:  General: alert, cooperative and appears stated age Lochia: appropriate Uterine Fundus: firm Incision: n/a DVT Evaluation: No evidence of DVT seen on physical exam. Negative Homan's sign. No cords or calf tenderness.   Recent Labs  04/06/14 2120 04/07/14 0629  HGB 12.4 11.6*  HCT 37.4 34.4*    Assessment/Plan: Discharge home   LOS: 2 days   Kaja Jackowski 04/08/2014, 9:12 AM

## 2014-04-08 NOTE — Discharge Summary (Signed)
Obstetric Discharge Summary Reason for Admission: onset of labor Prenatal Procedures: none Intrapartum Procedures: spontaneous vaginal delivery Postpartum Procedures: none Complications-Operative and Postpartum: none Hemoglobin  Date Value Ref Range Status  04/07/2014 11.6* 12.0 - 15.0 g/dL Final     HCT  Date Value Ref Range Status  04/07/2014 34.4* 36.0 - 46.0 % Final    Physical Exam:  General: alert, cooperative and appears stated age Lochia: appropriate Uterine Fundus: firm Incision: n/a DVT Evaluation: No evidence of DVT seen on physical exam. Negative Homan's sign. No cords or calf tenderness.  Discharge Diagnoses: Term Pregnancy-delivered  Discharge Information: Date: 04/08/2014 Activity: pelvic rest Diet: routine Medications: PNV and Ibuprofen Condition: stable Instructions: refer to practice specific booklet Discharge to: home   Newborn Data: Live born female  Birth Weight: 7 lb 1.4 oz (3215 g) APGAR: 8, 9  Home with mother.  Tanishi Nault 04/08/2014, 9:15 AM

## 2014-04-08 NOTE — Progress Notes (Signed)
Ur chart review completed.  

## 2014-05-14 ENCOUNTER — Other Ambulatory Visit: Payer: Self-pay | Admitting: Obstetrics and Gynecology

## 2014-05-16 LAB — CYTOLOGY - PAP

## 2014-07-05 HISTORY — PX: ESSURE TUBAL LIGATION: SUR464

## 2014-07-12 ENCOUNTER — Encounter (HOSPITAL_COMMUNITY): Payer: Self-pay | Admitting: Emergency Medicine

## 2014-07-12 ENCOUNTER — Emergency Department (INDEPENDENT_AMBULATORY_CARE_PROVIDER_SITE_OTHER)
Admission: EM | Admit: 2014-07-12 | Discharge: 2014-07-12 | Disposition: A | Discharge status: Home / Self Care | Payer: Medicaid Other | Source: Home / Self Care

## 2014-07-12 DIAGNOSIS — J069 Acute upper respiratory infection, unspecified: Secondary | ICD-10-CM

## 2014-07-12 DIAGNOSIS — R1030 Lower abdominal pain, unspecified: Secondary | ICD-10-CM

## 2014-07-12 LAB — POCT URINALYSIS DIP (DEVICE)
BILIRUBIN URINE: NEGATIVE
Glucose, UA: NEGATIVE mg/dL
Hgb urine dipstick: NEGATIVE
KETONES UR: NEGATIVE mg/dL
LEUKOCYTES UA: NEGATIVE
NITRITE: NEGATIVE
Protein, ur: NEGATIVE mg/dL
Specific Gravity, Urine: 1.02 (ref 1.005–1.030)
Urobilinogen, UA: 1 mg/dL (ref 0.0–1.0)
pH: 5.5 (ref 5.0–8.0)

## 2014-07-12 LAB — POCT RAPID STREP A: Streptococcus, Group A Screen (Direct): NEGATIVE

## 2014-07-12 LAB — POCT PREGNANCY, URINE: Preg Test, Ur: NEGATIVE

## 2014-07-12 NOTE — Discharge Instructions (Signed)
Call your doctor for further abdominal pain check today.

## 2014-07-12 NOTE — ED Provider Notes (Signed)
CSN: [id number]     Arrival date & time 07/12/14  0920 History   None    Chief Complaint  Patient presents with  . Post-op Problem   (Consider location/radiation/quality/duration/timing/severity/associated sxs/prior Treatment) Patient is a 28 y.o. female presenting with pharyngitis. The history is provided by the patient.  Sore Throat This is a new problem. The current episode started 3 to 5 hours ago. The problem has not changed since onset.Associated symptoms include abdominal pain. Associated symptoms comments: S/p tubal lig 1 wk ago , still with abd soreness, contacted office 2 d ago.. The symptoms are aggravated by swallowing.    Past Medical History  Diagnosis Date  . Bartholin gland cyst 05/04/2013  . GERD (gastroesophageal reflux disease)   . Allergic rhinitis   . Migraine without aura, without mention of intractable migraine without mention of status migrainosus 07/09/2013  . Vaginal Pap smear, abnormal    Past Surgical History  Procedure Laterality Date  . Adenoidectomy    . Tympanostomy tube placement    . Tonsillectomy     Family History  Problem Relation Age of Onset  . Hypertension Mother   . Heart disease Father   . Alcohol abuse Father   . Mental retardation Maternal Uncle   . Diabetes Maternal Grandmother   . Hypertension Maternal Grandmother   . Cancer Maternal Grandfather     prostate  . Alcohol abuse Other    History  Substance Use Topics  . Smoking status: Never Smoker   . Smokeless tobacco: Never Used  . Alcohol Use: No     Comment: Occasional   OB History   Grav Para Term Preterm Abortions TAB SAB Ect Mult Living   4 4 4       4      Review of Systems  Constitutional: Negative.  Negative for fever and chills.  HENT: Positive for sore throat. Negative for congestion.   Gastrointestinal: Positive for abdominal pain. Negative for nausea, vomiting and diarrhea.  Genitourinary: Positive for pelvic pain.    Allergies  Iodinated diagnostic  agents and Percocet  Home Medications   Prior to Admission medications   Medication Sig Start Date End Date Taking? Authorizing Provider  ibuprofen (ADVIL,MOTRIN) 600 MG tablet Take 1 tablet (600 mg total) by mouth every 6 (six) hours. 04/08/14   Megan Morris, DO  norethindrone (ORTHO MICRONOR) 0.35 MG tablet Take 1 tablet (0.35 mg total) by mouth daily. 04/08/14   Megan Morris, DO   BP 114/68  Pulse 77  Temp(Src) 98.3 F (36.8 C) (Oral)  Resp 18  SpO2 98% Physical Exam  Nursing note and vitals reviewed. Constitutional: She is oriented to person, place, and time. She appears well-developed and well-nourished. No distress.  HENT:  Right Ear: External ear normal.  Left Ear: External ear normal.  Mouth/Throat: Oropharynx is clear and moist.  Eyes: Pupils are equal, round, and reactive to light.  Neck: Normal range of motion. Neck supple.  Abdominal: Soft. Bowel sounds are normal. There is tenderness.  Lymphadenopathy:    She has no cervical adenopathy.  Neurological: She is alert and oriented to person, place, and time.  Skin: Skin is warm and dry.    ED Course  Procedures (including critical care time) Labs Review Labs Reviewed  POCT RAPID STREP A ([facility] CARE ONLY)  POCT URINALYSIS DIP (DEVICE)  POCT PREGNANCY, URINE   U/a and upreg neg strep neg. Imaging Review No results found.   MDM   1. URI (upper respiratory  infection)   2. Abdominal pain, suprapubic, unspecified laterality        Billy Fischer, MD 07/12/14 1014

## 2014-07-12 NOTE — ED Notes (Signed)
States she had her tubes tied last week, and since then, she has had pain in abdominal area and ST. States she was NOT put under for her procedure, and is concerned she may be having an allergic reaction to the contrast dye used during the procedure. NAD

## 2014-07-14 LAB — CULTURE, GROUP A STREP

## 2014-07-29 ENCOUNTER — Encounter (HOSPITAL_COMMUNITY): Payer: Self-pay | Admitting: Emergency Medicine

## 2014-08-08 ENCOUNTER — Encounter (HOSPITAL_COMMUNITY): Payer: Self-pay | Admitting: *Deleted

## 2014-08-08 ENCOUNTER — Inpatient Hospital Stay (HOSPITAL_COMMUNITY)
Admission: AD | Admit: 2014-08-08 | Discharge: 2014-08-08 | Disposition: A | Discharge status: Home / Self Care | Payer: Medicaid Other | Source: Ambulatory Visit | Attending: Obstetrics and Gynecology | Admitting: Obstetrics and Gynecology

## 2014-08-08 DIAGNOSIS — M5440 Lumbago with sciatica, unspecified side: Secondary | ICD-10-CM | POA: Insufficient documentation

## 2014-08-08 DIAGNOSIS — M545 Low back pain: Secondary | ICD-10-CM

## 2014-08-08 LAB — URINALYSIS, ROUTINE W REFLEX MICROSCOPIC
Bilirubin Urine: NEGATIVE
GLUCOSE, UA: NEGATIVE mg/dL
HGB URINE DIPSTICK: NEGATIVE
KETONES UR: NEGATIVE mg/dL
Leukocytes, UA: NEGATIVE
Nitrite: NEGATIVE
PROTEIN: NEGATIVE mg/dL
Specific Gravity, Urine: 1.025 (ref 1.005–1.030)
Urobilinogen, UA: 0.2 mg/dL (ref 0.0–1.0)
pH: 6 (ref 5.0–8.0)

## 2014-08-08 LAB — WET PREP, GENITAL
CLUE CELLS WET PREP: NONE SEEN
Trich, Wet Prep: NONE SEEN
Yeast Wet Prep HPF POC: NONE SEEN

## 2014-08-08 LAB — POCT PREGNANCY, URINE: Preg Test, Ur: NEGATIVE

## 2014-08-08 MED ORDER — KETOROLAC TROMETHAMINE 60 MG/2ML IM SOLN
60.0000 mg | Freq: Once | INTRAMUSCULAR | Status: AC
  Administered 2014-08-08: 60 mg via INTRAMUSCULAR
  Filled 2014-08-08: qty 2

## 2014-08-08 NOTE — Discharge Instructions (Signed)
Back Exercises Back exercises help treat and prevent back injuries. The goal of back exercises is to increase the strength of your abdominal and back muscles and the flexibility of your back. These exercises should be started when you no longer have back pain. Back exercises include:  Pelvic Tilt. Lie on your back with your knees bent. Tilt your pelvis until the lower part of your back is against the floor. Hold this position 5 to 10 sec and repeat 5 to 10 times.  Knee to Chest. Pull first 1 knee up against your chest and hold for 20 to 30 seconds, repeat this with the other knee, and then both knees. This may be done with the other leg straight or bent, whichever feels better.  Sit-Ups or Curl-Ups. Bend your knees 90 degrees. Start with tilting your pelvis, and do a partial, slow sit-up, lifting your trunk only 30 to 45 degrees off the floor. Take at least 2 to 3 seconds for each sit-up. Do not do sit-ups with your knees out straight. If partial sit-ups are difficult, simply do the above but with only tightening your abdominal muscles and holding it as directed.  Hip-Lift. Lie on your back with your knees flexed 90 degrees. Push down with your feet and shoulders as you raise your hips a couple inches off the floor; hold for 10 seconds, repeat 5 to 10 times.  Back arches. Lie on your stomach, propping yourself up on bent elbows. Slowly press on your hands, causing an arch in your low back. Repeat 3 to 5 times. Any initial stiffness and discomfort should lessen with repetition over time.  Shoulder-Lifts. Lie face down with arms beside your body. Keep hips and torso pressed to floor as you slowly lift your head and shoulders off the floor. Do not overdo your exercises, especially in the beginning. Exercises may cause you some mild back discomfort which lasts for a few minutes; however, if the pain is more severe, or lasts for more than 15 minutes, do not continue exercises until you see your caregiver.  Improvement with exercise therapy for back problems is slow.  See your caregivers for assistance with developing a proper back exercise program. Document Released: 10/21/2004 Document Revised: 12/06/2011 Document Reviewed: 07/15/2011 [facility] Patient Information 2015 [place], [place]. This information is not intended to replace advice given to you by your health care provider. Make sure you discuss any questions you have with your health care provider.  Back Exercises Back exercises help treat and prevent back injuries. The goal of back exercises is to increase the strength of your abdominal and back muscles and the flexibility of your back. These exercises should be started when you no longer have back pain. Back exercises include:  Pelvic Tilt. Lie on your back with your knees bent. Tilt your pelvis until the lower part of your back is against the floor. Hold this position 5 to 10 sec and repeat 5 to 10 times.  Knee to Chest. Pull first 1 knee up against your chest and hold for 20 to 30 seconds, repeat this with the other knee, and then both knees. This may be done with the other leg straight or bent, whichever feels better.  Sit-Ups or Curl-Ups. Bend your knees 90 degrees. Start with tilting your pelvis, and do a partial, slow sit-up, lifting your trunk only 30 to 45 degrees off the floor. Take at least 2 to 3 seconds for each sit-up. Do not do sit-ups with your knees out straight. If  partial sit-ups are difficult, simply do the above but with only tightening your abdominal muscles and holding it as directed.  Hip-Lift. Lie on your back with your knees flexed 90 degrees. Push down with your feet and shoulders as you raise your hips a couple inches off the floor; hold for 10 seconds, repeat 5 to 10 times.  Back arches. Lie on your stomach, propping yourself up on bent elbows. Slowly press on your hands, causing an arch in your low back. Repeat 3 to 5 times. Any initial stiffness and discomfort  should lessen with repetition over time.  Shoulder-Lifts. Lie face down with arms beside your body. Keep hips and torso pressed to floor as you slowly lift your head and shoulders off the floor. Do not overdo your exercises, especially in the beginning. Exercises may cause you some mild back discomfort which lasts for a few minutes; however, if the pain is more severe, or lasts for more than 15 minutes, do not continue exercises until you see your caregiver. Improvement with exercise therapy for back problems is slow.  See your caregivers for assistance with developing a proper back exercise program. Document Released: 10/21/2004 Document Revised: 12/06/2011 Document Reviewed: 07/15/2011 [facility] Patient Information 2015 [place], [place]. This information is not intended to replace advice given to you by your health care provider. Make sure you discuss any questions you have with your health care provider. Back Pain, Adult Low back pain is very common. About 1 in 5 people have back pain.The cause of low back pain is rarely dangerous. The pain often gets better over time.About half of people with a sudden onset of back pain feel better in just 2 weeks. About 8 in 10 people feel better by 6 weeks.  CAUSES Some common causes of back pain include:  Strain of the muscles or ligaments supporting the spine.  Wear and tear (degeneration) of the spinal discs.  Arthritis.  Direct injury to the back. DIAGNOSIS Most of the time, the direct cause of low back pain is not known.However, back pain can be treated effectively even when the exact cause of the pain is unknown.Answering your caregiver's questions about your overall health and symptoms is one of the most accurate ways to make sure the cause of your pain is not dangerous. If your caregiver needs more information, he or she may order lab work or imaging tests (X-rays or MRIs).However, even if imaging tests show changes in your back, this usually  does not require surgery. HOME CARE INSTRUCTIONS For many people, back pain returns.Since low back pain is rarely dangerous, it is often a condition that people can learn to [facility] their own.   Remain active. It is stressful on the back to sit or stand in one place. Do not sit, drive, or stand in one place for more than 30 minutes at a time. Take short walks on level surfaces as soon as pain allows.Try to increase the length of time you walk each day.  Do not stay in bed.Resting more than 1 or 2 days can delay your recovery.  Do not avoid exercise or work.Your body is made to move.It is not dangerous to be active, even though your back may hurt.Your back will likely heal faster if you return to being active before your pain is gone.  Pay attention to your body when you bend and lift. Many people have less discomfortwhen lifting if they bend their knees, keep the load close to their bodies,and avoid twisting. Often, the  most comfortable positions are those that put less stress on your recovering back.  Find a comfortable position to sleep. Use a firm mattress and lie on your side with your knees slightly bent. If you lie on your back, put a pillow under your knees.  Only take over-the-counter or prescription medicines as directed by your caregiver. Over-the-counter medicines to reduce pain and inflammation are often the most helpful.Your caregiver may prescribe muscle relaxant drugs.These medicines help dull your pain so you can more quickly return to your normal activities and healthy exercise.  Put ice on the injured area.  Put ice in a plastic bag.  Place a towel between your skin and the bag.  Leave the ice on for 15-20 minutes, 03-04 times a day for the first 2 to 3 days. After that, ice and heat may be alternated to reduce pain and spasms.  Ask your caregiver about trying back exercises and gentle massage. This may be of some benefit.  Avoid feeling anxious or  stressed.Stress increases muscle tension and can worsen back pain.It is important to recognize when you are anxious or stressed and learn ways to manage it.Exercise is a great option. SEEK MEDICAL CARE IF:  You have pain that is not relieved with rest or medicine.  You have pain that does not improve in 1 week.  You have new symptoms.  You are generally not feeling well. SEEK IMMEDIATE MEDICAL CARE IF:   You have pain that radiates from your back into your legs.  You develop new bowel or bladder control problems.  You have unusual weakness or numbness in your arms or legs.  You develop nausea or vomiting.  You develop abdominal pain.  You feel faint. Document Released: 09/13/2005 Document Revised: 03/14/2012 Document Reviewed: 01/15/2014 [facility] Patient Information 2015 [place], [place]. This information is not intended to replace advice given to you by your health care provider. Make sure you discuss any questions you have with your health care provider.

## 2014-08-08 NOTE — MAU Provider Note (Signed)
History     CSN: [id number]  Arrival date and time: 08/08/14 1540   First Provider Initiated Contact with Patient 08/08/14 1014      Chief Complaint  Patient presents with  . Back Pain   HPI Veronica Raymond 28 y.o. [id number] nonpregnant female presents to MAU complaining of back pain on both sides in the lower back.  It is 7/10 and is increased with moving, lying down or sitting up straight.  It radiates down further into her back but not into the gluts.  She could not sleep because of the pain.  She has not tried any medication for this.   It started yesterday and she notes no recent injury.  She had Essure in early October and was due to have follow up appt yesterday but this was rescheduled for emergency.  She is also having increasing stomach pains that she fears may be related to Essure.  It is sharp pain with bloating.   She denies any fever, weakness, dizziness, vaginal discharge or bleeding, headache, dysuria.   OB History    Gravida Para Term Preterm AB TAB SAB Ectopic Multiple Living   4 4 4       4       Past Medical History  Diagnosis Date  . Bartholin gland cyst 05/04/2013  . GERD (gastroesophageal reflux disease)   . Allergic rhinitis   . Migraine without aura, without mention of intractable migraine without mention of status migrainosus 07/09/2013  . Vaginal Pap smear, abnormal     Past Surgical History  Procedure Laterality Date  . Adenoidectomy    . Tympanostomy tube placement    . Tonsillectomy    . Essure tubal ligation  07-05-2014    Family History  Problem Relation Age of Onset  . Hypertension Mother   . Heart disease Father   . Alcohol abuse Father   . Mental retardation Maternal Uncle   . Diabetes Maternal Grandmother   . Hypertension Maternal Grandmother   . Cancer Maternal Grandfather     prostate  . Alcohol abuse Other     History  Substance Use Topics  . Smoking status: Never Smoker   . Smokeless tobacco: Never Used  . Alcohol Use: No      Comment: Occasional    Allergies:  Allergies  Allergen Reactions  . Iodinated Diagnostic Agents Hives  . Percocet [Oxycodone-Acetaminophen] Itching    Prescriptions prior to admission  Medication Sig Dispense Refill Last Dose  . Fenugreek 500 MG CAPS Take 3 capsules by mouth 3 (three) times daily.   08/07/2014 at Unknown time  . ibuprofen (ADVIL,MOTRIN) 600 MG tablet Take 1 tablet (600 mg total) by mouth every 6 (six) hours. (Patient not taking: Reported on 08/08/2014) 30 tablet 0 Unknown at Unknown time  . norethindrone (ORTHO MICRONOR) 0.35 MG tablet Take 1 tablet (0.35 mg total) by mouth daily. (Patient not taking: Reported on 08/08/2014) 1 Package 11 Unknown at Unknown time    ROS Pertinent ROS in HPI Physical Exam   Blood pressure 110/59, pulse 67, temperature 98.4 F (36.9 C), temperature source Oral, resp. rate 18, height 5\' 4"  (1.626 m), weight 163 lb (73.936 kg), currently breastfeeding.  Physical Exam  Constitutional: She is oriented to person, place, and time. She appears well-developed and well-nourished.  HENT:  Head: Normocephalic and atraumatic.  Eyes: EOM are normal.  Neck: Normal range of motion.  Cardiovascular: Normal rate, regular rhythm and normal heart sounds.   Respiratory: Effort normal and  breath sounds normal. No respiratory distress.  GI: Soft. Bowel sounds are normal. She exhibits no distension. There is no tenderness. There is no rebound and no guarding.  Genitourinary:  Small amt of thin white discharge Cervix with erythema of the os and clear mucus present No CMT, no adnexal mass or tenderness  Musculoskeletal: Normal range of motion.  Unable to elicit tenderness in back.  Pt indicates slight improvement with palpation.  Neurological: She is alert and oriented to person, place, and time.  Skin: Skin is warm and dry.  Psychiatric: She has a normal mood and affect.   Results for orders placed or performed during the hospital encounter of  08/08/14 (from the past 24 hour(s))  Urinalysis, Routine w reflex microscopic     Status: None   Collection Time: 08/08/14  9:10 AM  Result Value Ref Range   Color, Urine YELLOW YELLOW   APPearance CLEAR CLEAR   Specific Gravity, Urine 1.025 1.005 - 1.030   pH 6.0 5.0 - 8.0   Glucose, UA NEGATIVE NEGATIVE mg/dL   Hgb urine dipstick NEGATIVE NEGATIVE   Bilirubin Urine NEGATIVE NEGATIVE   Ketones, ur NEGATIVE NEGATIVE mg/dL   Protein, ur NEGATIVE NEGATIVE mg/dL   Urobilinogen, UA 0.2 0.0 - 1.0 mg/dL   Nitrite NEGATIVE NEGATIVE   Leukocytes, UA NEGATIVE NEGATIVE  Pregnancy, urine POC     Status: None   Collection Time: 08/08/14  9:22 AM  Result Value Ref Range   Preg Test, Ur NEGATIVE NEGATIVE  Wet prep, genital     Status: Abnormal   Collection Time: 08/08/14 11:43 AM  Result Value Ref Range   Yeast Wet Prep HPF POC NONE SEEN NONE SEEN   Trich, Wet Prep NONE SEEN NONE SEEN   Clue Cells Wet Prep HPF POC NONE SEEN NONE SEEN   WBC, Wet Prep HPF POC FEW (A) NONE SEEN    MAU Course  Procedures  MDM Initially, pt was too tender to fully examine.  IM Toradol was ordered which improved pain from 10/0 to 7/10.  Exam performed.   Discussed pt with Dr. Matthew Saras.  He advises for pt discharge and follow up in office asap.   On return to pt's room, she is sleeping.  On awakening, she reports pain is 1/10.    Assessment and Plan  A:  1. Bilateral low back pain, with sciatica presence unspecified    P: Discharge to home Ibuprofen for pain.  Pt already has rx at home and it is her preference to use this Follow up in office asap regarding Essure and symptoms.  Return to MAU for emergency.  Paticia Stack 08/08/2014, 10:15 AM

## 2014-08-08 NOTE — MAU Note (Signed)
Woke up yesterday with a knot type pain in back yesterday. As the day went on, the pain got worse.  Had essure procedure on tube on 10/09- unsure if related.

## 2014-08-09 LAB — GC/CHLAMYDIA PROBE AMP
CT Probe RNA: NEGATIVE
GC PROBE AMP APTIMA: NEGATIVE

## 2014-09-18 ENCOUNTER — Other Ambulatory Visit (HOSPITAL_COMMUNITY): Payer: Self-pay | Admitting: Obstetrics and Gynecology

## 2014-09-18 DIAGNOSIS — Z09 Encounter for follow-up examination after completed treatment for conditions other than malignant neoplasm: Secondary | ICD-10-CM

## 2014-09-18 DIAGNOSIS — Z302 Encounter for sterilization: Secondary | ICD-10-CM

## 2014-10-11 ENCOUNTER — Ambulatory Visit (HOSPITAL_COMMUNITY)
Admission: RE | Admit: 2014-10-11 | Discharge: 2014-10-11 | Disposition: A | Discharge status: Home / Self Care | Payer: Medicaid Other | Source: Ambulatory Visit | Attending: Obstetrics and Gynecology | Admitting: Obstetrics and Gynecology

## 2014-10-11 DIAGNOSIS — Z09 Encounter for follow-up examination after completed treatment for conditions other than malignant neoplasm: Secondary | ICD-10-CM

## 2014-10-11 DIAGNOSIS — Z302 Encounter for sterilization: Secondary | ICD-10-CM

## 2014-10-18 ENCOUNTER — Other Ambulatory Visit (HOSPITAL_COMMUNITY): Payer: Medicaid Other

## 2014-10-18 ENCOUNTER — Ambulatory Visit (HOSPITAL_COMMUNITY): Payer: Medicaid Other

## 2014-10-21 ENCOUNTER — Ambulatory Visit (HOSPITAL_COMMUNITY)
Admission: RE | Admit: 2014-10-21 | Discharge: 2014-10-21 | Disposition: A | Discharge status: Home / Self Care | Payer: Medicaid Other | Source: Ambulatory Visit | Attending: Obstetrics and Gynecology | Admitting: Obstetrics and Gynecology

## 2014-10-21 DIAGNOSIS — Z3049 Encounter for surveillance of other contraceptives: Secondary | ICD-10-CM | POA: Insufficient documentation

## 2014-10-21 DIAGNOSIS — Z302 Encounter for sterilization: Secondary | ICD-10-CM

## 2014-10-21 DIAGNOSIS — Z09 Encounter for follow-up examination after completed treatment for conditions other than malignant neoplasm: Secondary | ICD-10-CM

## 2014-10-21 MED ORDER — IOHEXOL 300 MG/ML  SOLN
20.0000 mL | Freq: Once | INTRAMUSCULAR | Status: AC | PRN
  Administered 2014-10-21: 11 mL

## 2014-11-13 ENCOUNTER — Encounter (HOSPITAL_COMMUNITY): Payer: Self-pay | Admitting: *Deleted

## 2014-11-13 ENCOUNTER — Emergency Department (HOSPITAL_COMMUNITY): Payer: Medicaid Other

## 2014-11-13 ENCOUNTER — Emergency Department (HOSPITAL_COMMUNITY)
Admission: EM | Admit: 2014-11-13 | Discharge: 2014-11-13 | Discharge status: Against Medical Advice | Payer: Medicaid Other | Source: Home / Self Care | Attending: Family Medicine | Admitting: Family Medicine

## 2014-11-13 DIAGNOSIS — R1012 Left upper quadrant pain: Secondary | ICD-10-CM | POA: Diagnosis not present

## 2014-11-13 DIAGNOSIS — M25511 Pain in right shoulder: Secondary | ICD-10-CM

## 2014-11-13 LAB — POCT URINALYSIS DIP (DEVICE)
Bilirubin Urine: NEGATIVE
GLUCOSE, UA: NEGATIVE mg/dL
Hgb urine dipstick: NEGATIVE
Ketones, ur: NEGATIVE mg/dL
Leukocytes, UA: NEGATIVE
NITRITE: NEGATIVE
Protein, ur: NEGATIVE mg/dL
SPECIFIC GRAVITY, URINE: 1.025 (ref 1.005–1.030)
Urobilinogen, UA: 0.2 mg/dL (ref 0.0–1.0)
pH: 6 (ref 5.0–8.0)

## 2014-11-13 NOTE — ED Notes (Signed)
Patient changed back into street clothes, stated she had to leave. Didn't want x-rays

## 2014-11-13 NOTE — ED Notes (Signed)
Pt  Has   Two    Complaints      Pt  Reports  A  Knot   On the  l  Side  Of  abd    X  1  Month       Pt  Also  Reports  Pain r  Shoulder  For   Weeks    Pt  denys  Any  Vomiting  /  Diarrhea    -  She  Does  However  Report  Some  Nausea      Pt  denys  Any  Injury

## 2014-11-13 NOTE — ED Notes (Signed)
Pt  Refuses   X  Rays        Veronica Raymond  The  Pa   Spoke   With  The pt         This  Therapist, occupational in to  Discharge  The  Pt     And  She  Had  Apparently    Eloped

## 2014-11-13 NOTE — ED Provider Notes (Signed)
CSN: [id number]     Arrival date & time 11/13/14  0801 History   First MD Initiated Contact with Patient 11/13/14 0813     Chief Complaint  Patient presents with  . Shoulder Pain   (Consider location/radiation/quality/duration/timing/severity/associated sxs/prior Treatment) HPI Comments: Patient presents requesting evaluation of multiple issues. She describes a one month history of an intermittently occurring uncomfortable "knot" under rib cage in the left upper portion of her abdomen. States this discomfort occurs with movement or twisting motion of her torso and often improves when she stops moving. Has been treating discomfort with tylenol. No reported injury. Next she also reports occasional discomfort at top of right shoulder with movement of RUE. 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 No reported injury. Shoulder issues have occurred over past several months.  Lastly she mentions chronic intermittent low back discomfort that also occurs or is exacerbated by movement. This is also successfully treated with tylenol and is without associated GI or GU symptoms and is without sensory-motor changes in lower extremities.  PCP: Jeanie Cooks LNMP: 10/16/2014 LNBM: yesterday  The history is provided by the patient.    Past Medical History  Diagnosis Date  . Bartholin gland cyst 05/04/2013  . GERD (gastroesophageal reflux disease)   . Allergic rhinitis   . Migraine without aura, without mention of intractable migraine without mention of status migrainosus 07/09/2013  . Vaginal Pap smear, abnormal    Past Surgical History  Procedure Laterality Date  . Adenoidectomy    . Tympanostomy tube placement    . Tonsillectomy    . Essure tubal ligation  07-05-2014   Family History  Problem Relation Age of Onset  . Hypertension Mother   . Heart disease Father   . Alcohol abuse Father   . Mental retardation Maternal Uncle   . Diabetes Maternal Grandmother   . Hypertension  Maternal Grandmother   . Cancer Maternal Grandfather     prostate  . Alcohol abuse Other    History  Substance Use Topics  . Smoking status: Never Smoker   . Smokeless tobacco: Never Used  . Alcohol Use: No     Comment: Occasional   OB History    Gravida Para Term Preterm AB TAB SAB Ectopic Multiple Living   4 4 4       4      Review of Systems  Constitutional: Negative.   HENT: Negative.   Eyes: Negative.   Respiratory: Negative.   Cardiovascular: Negative.   Gastrointestinal: Positive for nausea and abdominal pain. Negative for vomiting, diarrhea, constipation, blood in stool, abdominal distention, anal bleeding and rectal pain.  Endocrine: Negative for polydipsia, polyphagia and polyuria.  Genitourinary: Negative.   Musculoskeletal: Positive for back pain.  Skin: Negative.   Neurological: Negative.     Allergies  Iodinated diagnostic agents and Percocet  Home Medications   Prior to Admission medications   Medication Sig Start Date End Date Taking? Authorizing Provider  Fenugreek 500 MG CAPS Take 3 capsules by mouth 3 (three) times daily.    Historical Provider, MD  ibuprofen (ADVIL,MOTRIN) 600 MG tablet Take 1 tablet (600 mg total) by mouth every 6 (six) hours. Patient not taking: Reported on 08/08/2014 04/08/14   Megan Morris, DO   BP 103/67 mmHg  Pulse 68  Temp(Src) 98 F (36.7 C) (Oral)  Resp 12  SpO2 100%  LMP 10/16/2014 Physical Exam  Constitutional: She is oriented to person, place, and time. She appears well-developed and well-nourished.  No distress.  HENT:  Head: Normocephalic and atraumatic.  Right Ear: Hearing, tympanic membrane, external ear and ear canal normal.  Left Ear: Hearing, tympanic membrane, external ear and ear canal normal.  Nose: Nose normal.  Mouth/Throat: Uvula is midline, oropharynx is clear and moist and mucous membranes are normal.  Eyes: Conjunctivae are normal. No scleral icterus.  Neck: Normal range of motion. Neck supple.   Cardiovascular: Normal rate, regular rhythm and normal heart sounds.   Pulmonary/Chest: Effort normal and breath sounds normal. No respiratory distress. She has no wheezes. She exhibits no tenderness.  Abdominal: Soft. Normal appearance and bowel sounds are normal. She exhibits no distension and no mass.  Musculoskeletal: Normal range of motion.  Lymphadenopathy:    She has no cervical adenopathy.  Neurological: She is alert and oriented to person, place, and time.  Skin: Skin is warm and dry. No rash noted. No erythema.  Psychiatric: She has a normal mood and affect. Her behavior is normal.  Nursing note and vitals reviewed.   ED Course  Procedures (including critical care time) Labs Review Labs Reviewed  POCT URINALYSIS DIP (DEVICE)    Imaging Review No results found.   MDM   1. Left upper quadrant pain   2. LUQ abdominal pain   3. Right shoulder pain   UA unremarkable. Patient, following UA results, decided she did not want to stay to complete remainder of evaluation. While I advised her that both her abdominal and right shoulder issues, based upon history and exam, seemed to musculoskeletal in nature. I encouraged her to return to complete her evaluation when she is more available.  Advised to seek urgent re-evaluation at nearest ER should symptoms become suddenly worse or severe.     Lutricia Feil, [place] 11/13/14 1001

## 2014-11-13 NOTE — Discharge Instructions (Signed)
Your urine studies were normal. I am sorry that you were unable to stay to complete your abdominal evaluation & xrays. You are always welcome to return to complete your evaluation. It is difficult to say what is causing your discomfort without imaging, however, if symptoms become suddenly worse or severe, you should report to your nearest ER for re-evaluation. Your right shoulder discomfort seem to be a muscular issue and will likely respond to use of either tylenol or ibuprofen.  Abdominal Pain, Women Abdominal (stomach, pelvic, or belly) pain can be caused by many things. It is important to tell your doctor:  The location of the pain.  Does it come and go or is it present all the time?  Are there things that start the pain (eating certain foods, exercise)?  Are there other symptoms associated with the pain (fever, nausea, vomiting, diarrhea)? All of this is helpful to know when trying to find the cause of the pain. CAUSES   Stomach: virus or bacteria infection, or ulcer.  Intestine: appendicitis (inflamed appendix), regional ileitis (Crohn's disease), ulcerative colitis (inflamed colon), irritable bowel syndrome, diverticulitis (inflamed diverticulum of the colon), or cancer of the stomach or intestine.  Gallbladder disease or stones in the gallbladder.  Kidney disease, kidney stones, or infection.  Pancreas infection or cancer.  Fibromyalgia (pain disorder).  Diseases of the female organs:  Uterus: fibroid (non-cancerous) tumors or infection.  Fallopian tubes: infection or tubal pregnancy.  Ovary: cysts or tumors.  Pelvic adhesions (scar tissue).  Endometriosis (uterus lining tissue growing in the pelvis and on the pelvic organs).  Pelvic congestion syndrome (female organs filling up with blood just before the menstrual period).  Pain with the menstrual period.  Pain with ovulation (producing an egg).  Pain with an IUD (intrauterine device, birth control) in the  uterus.  Cancer of the female organs.  Functional pain (pain not caused by a disease, may improve without treatment).  Psychological pain.  Depression. DIAGNOSIS  Your doctor will decide the seriousness of your pain by doing an examination.  Blood tests.  X-rays.  Ultrasound.  CT scan (computed tomography, special type of X-ray).  MRI (magnetic resonance imaging).  Cultures, for infection.  Barium enema (dye inserted in the large intestine, to better view it with X-rays).  Colonoscopy (looking in intestine with a lighted tube).  Laparoscopy (minor surgery, looking in abdomen with a lighted tube).  Major abdominal exploratory surgery (looking in abdomen with a large incision). TREATMENT  The treatment will depend on the cause of the pain.   Many cases can be observed and treated at home.  Over-the-counter medicines recommended by your caregiver.  Prescription medicine.  Antibiotics, for infection.  Birth control pills, for painful periods or for ovulation pain.  Hormone treatment, for endometriosis.  Nerve blocking injections.  Physical therapy.  Antidepressants.  Counseling with a psychologist or psychiatrist.  Minor or major surgery. HOME CARE INSTRUCTIONS   Do not take laxatives, unless directed by your caregiver.  Take over-the-counter pain medicine only if ordered by your caregiver. Do not take aspirin because it can cause an upset stomach or bleeding.  Try a clear liquid diet (broth or water) as ordered by your caregiver. Slowly move to a bland diet, as tolerated, if the pain is related to the stomach or intestine.  Have a thermometer and take your temperature several times a day, and record it.  Bed rest and sleep, if it helps the pain.  Avoid sexual intercourse, if it causes pain.  Avoid stressful situations.  Keep your follow-up appointments and tests, as your caregiver orders.  If the pain does not go away with medicine or surgery, you  may try:  Acupuncture.  Relaxation exercises (yoga, meditation).  Group therapy.  Counseling. SEEK MEDICAL CARE IF:   You notice certain foods cause stomach pain.  Your home care treatment is not helping your pain.  You need stronger pain medicine.  You want your IUD removed.  You feel faint or lightheaded.  You develop nausea and vomiting.  You develop a rash.  You are having side effects or an allergy to your medicine. SEEK IMMEDIATE MEDICAL CARE IF:   Your pain does not go away or gets worse.  You have a fever.  Your pain is felt only in portions of the abdomen. The right side could possibly be appendicitis. The left lower portion of the abdomen could be colitis or diverticulitis.  You are passing blood in your stools (bright red or black tarry stools, with or without vomiting).  You have blood in your urine.  You develop chills, with or without a fever.  You pass out. MAKE SURE YOU:   Understand these instructions.  Will watch your condition.  Will get help right away if you are not doing well or get worse. Document Released: 07/11/2007 Document Revised: 01/28/2014 Document Reviewed: 07/31/2009 [facility] Patient Information 2015 [place], [place]. This information is not intended to replace advice given to you by your health care provider. Make sure you discuss any questions you have with your health care provider.  Abdominal Pain Many things can cause abdominal pain. Usually, abdominal pain is not caused by a disease and will improve without treatment. It can often be observed and treated at home. Your health care provider will do a physical exam and possibly order blood tests and X-rays to help determine the seriousness of your pain. However, in many cases, more time must pass before a clear cause of the pain can be found. Before that point, your health care provider may not know if you need more testing or further treatment. HOME CARE INSTRUCTIONS  Monitor  your abdominal pain for any changes. The following actions may help to alleviate any discomfort you are experiencing:  Only take over-the-counter or prescription medicines as directed by your health care provider.  Do not take laxatives unless directed to do so by your health care provider.  Try a clear liquid diet (broth, tea, or water) as directed by your health care provider. Slowly move to a bland diet as tolerated. SEEK MEDICAL CARE IF:  You have unexplained abdominal pain.  You have abdominal pain associated with nausea or diarrhea.  You have pain when you urinate or have a bowel movement.  You experience abdominal pain that wakes you in the night.  You have abdominal pain that is worsened or improved by eating food.  You have abdominal pain that is worsened with eating fatty foods.  You have a fever. SEEK IMMEDIATE MEDICAL CARE IF:   Your pain does not go away within 2 hours.  You keep throwing up (vomiting).  Your pain is felt only in portions of the abdomen, such as the right side or the left lower portion of the abdomen.  You pass bloody or black tarry stools. MAKE SURE YOU:  Understand these instructions.   Will watch your condition.   Will get help right away if you are not doing well or get worse.  Document Released: 06/23/2005 Document Revised: 09/18/2013  Document Reviewed: 05/23/2013 [facility] Patient Information 2015 [place], [place]. This information is not intended to replace advice given to you by your health care provider. Make sure you discuss any questions you have with your health care provider.  Shoulder Range of Motion Exercises The shoulder is the most flexible joint in the human body. Because of this it is also the most unstable joint in the body. All ages can develop shoulder problems. Early treatment of problems is necessary for a good outcome. People react to shoulder pain by decreasing the movement of the joint. After a brief period of time,  the shoulder can become "frozen". This is an almost complete loss of the ability to move the damaged shoulder. Following injuries your caregivers can give you instructions on exercises to keep your range of motion (ability to move your shoulder freely), or regain it if it has been lost.  [place]: Codman's Exercise or Pendulum Exercise  This exercise may be performed in a prone (face-down) lying position or standing while leaning on a chair with the opposite arm. Its purpose is to relax the muscles in your shoulder and slowly but surely increase the range of motion and to relieve pain.  Lie on your stomach close to the side edge of the bed. Let your weak arm hang over the edge of the bed. Relax your shoulder, arm and hand. Let your shoulder blade relax and drop down.  Slowly and gently swing your arm forward and back. Do not use your neck muscles; relax them. It might be easier to have someone else gently start swinging your arm.  As pain decreases, increase your swing. To start, arm swing should begin at 15 degree angles. In time and as pain lessens, move to 30-45 degree angles. Start with swinging for about 15 seconds, and work towards swinging for 3 to 5 minutes.  This exercise may also be performed in a standing/bent over position.  Stand and hold onto a sturdy chair with your good arm. Bend forward at the waist and bend your knees slightly to help protect your back. Relax your weak arm, let it hang limp. Relax your shoulder blade and let it drop.  Keep your shoulder relaxed and use body motion to swing your arm in small circles.  Stand up tall and relax.  Repeat motion and change direction of circles.  Start with swinging for about 30 seconds, and work towards swinging for 3 to 5 minutes. STRETCHING EXERCISES:  Lift your arm out in front of you with the elbow bent at 90 degrees. Using your other arm gently pull the elbow forward and  across your body.  Bend one arm behind you with the palm facing outward. Using the other arm, hold a towel or rope and reach this arm up above your head, then bend it at the elbow to move your wrist to behind your neck. Grab the free end of the towel with the hand behind your back. Gently pull the towel up with the hand behind your neck, gradually increasing the pull on the hand behind the small of your back. Then, gradually pull down with the hand behind the small of your back. This will pull the hand and arm behind your neck further. Both shoulders will have an increased range of motion with repetition of this exercise. STRENGTHENING EXERCISES:  Standing with your arm at your side and straight out from your shoulder with the elbow bent at 90 degrees,  hold onto a small weight and slowly raise your hand so it points straight up in the air. Repeat this five times to begin with, and gradually increase to ten times. Do this four times per day. As you grow stronger you can gradually increase the weight.  Repeat the above exercise, only this time using an elastic band. Start with your hand up in the air and pull down until your hand is by your side. As you grow stronger, gradually increase the amount you pull by increasing the number or size of the elastic bands. Use the same amount of repetitions.  Standing with your hand at your side and holding onto a weight, gradually lift the hand in front of you until it is over your head. Do the same also with the hand remaining at your side and lift the hand away from your body until it is again over your head. Repeat this five times to begin with, and gradually increase to ten times. Do this four times per day. As you grow stronger you can gradually increase the weight. Document Released: 06/12/2003 Document Revised: 09/18/2013 Document Reviewed: 09/13/2005 [facility] Patient Information 2015 [place], [place]. This information is not intended to replace advice given to  you by your health care provider. Make sure you discuss any questions you have with your health care provider.  Shoulder Pain The shoulder is the joint that connects your arms to your body. The bones that form the shoulder joint include the upper arm bone (humerus), the shoulder blade (scapula), and the collarbone (clavicle). The top of the humerus is shaped like a ball and fits into a rather flat socket on the scapula (glenoid cavity). A combination of muscles and strong, fibrous tissues that connect muscles to bones (tendons) support your shoulder joint and hold the ball in the socket. Small, fluid-filled sacs (bursae) are located in different areas of the joint. They act as cushions between the bones and the overlying soft tissues and help reduce friction between the gliding tendons and the bone as you move your arm. Your shoulder joint allows a wide range of motion in your arm. This range of motion allows you to do things like scratch your back or throw a ball. However, this range of motion also makes your shoulder more prone to pain from overuse and injury. Causes of shoulder pain can originate from both injury and overuse and usually can be grouped in the following four categories:  Redness, swelling, and pain (inflammation) of the tendon (tendinitis) or the bursae (bursitis).  Instability, such as a dislocation of the joint.  Inflammation of the joint (arthritis).  Broken bone (fracture). HOME CARE INSTRUCTIONS   Apply ice to the sore area.  Put ice in a plastic bag.  Place a towel between your skin and the bag.  Leave the ice on for 15-20 minutes, 3-4 times per day for the first 2 days, or as directed by your health care provider.  Stop using cold packs if they do not help with the pain.  If you have a shoulder sling or immobilizer, wear it as long as your caregiver instructs. Only remove it to shower or bathe. Move your arm as little as possible, but keep your hand moving to prevent  swelling.  Squeeze a soft ball or foam pad as much as possible to help prevent swelling.  Only take over-the-counter or prescription medicines for pain, discomfort, or fever as directed by your caregiver. SEEK MEDICAL CARE IF:   Your shoulder  pain increases, or new pain develops in your arm, hand, or fingers.  Your hand or fingers become cold and numb.  Your pain is not relieved with medicines. SEEK IMMEDIATE MEDICAL CARE IF:   Your arm, hand, or fingers are numb or tingling.  Your arm, hand, or fingers are significantly swollen or turn white or blue. MAKE SURE YOU:   Understand these instructions.  Will watch your condition.  Will get help right away if you are not doing well or get worse. Document Released: 06/23/2005 Document Revised: 01/28/2014 Document Reviewed: 08/28/2011 [facility] Patient Information 2015 [place], [place]. This information is not intended to replace advice given to you by your health care provider. Make sure you discuss any questions you have with your health care provider.  Musculoskeletal Pain Musculoskeletal pain is muscle and boney aches and pains. These pains can occur in any part of the body. Your caregiver may treat you without knowing the cause of the pain. They may treat you if blood or urine tests, X-rays, and other tests were normal.  CAUSES There is often not a definite cause or reason for these pains. These pains may be caused by a type of germ (virus). The discomfort may also come from overuse. Overuse includes working out too hard when your body is not fit. Boney aches also come from weather changes. Bone is sensitive to atmospheric pressure changes. HOME CARE INSTRUCTIONS   Ask when your test results will be ready. Make sure you get your test results.  Only take over-the-counter or prescription medicines for pain, discomfort, or fever as directed by your caregiver. If you were given medications for your condition, do not drive, operate  machinery or power tools, or sign legal documents for 24 hours. Do not drink alcohol. Do not take sleeping pills or other medications that may interfere with treatment.  Continue all activities unless the activities cause more pain. When the pain lessens, slowly resume normal activities. Gradually increase the intensity and duration of the activities or exercise.  During periods of severe pain, bed rest may be helpful. Lay or sit in any position that is comfortable.  Putting ice on the injured area.  Put ice in a bag.  Place a towel between your skin and the bag.  Leave the ice on for 15 to 20 minutes, 3 to 4 times a day.  Follow up with your caregiver for continued problems and no reason can be found for the pain. If the pain becomes worse or does not go away, it may be necessary to repeat tests or do additional testing. Your caregiver may need to look further for a possible cause. SEEK IMMEDIATE MEDICAL CARE IF:  You have pain that is getting worse and is not relieved by medications.  You develop chest pain that is associated with shortness or breath, sweating, feeling sick to your stomach (nauseous), or throw up (vomit).  Your pain becomes localized to the abdomen.  You develop any new symptoms that seem different or that concern you. MAKE SURE YOU:   Understand these instructions.  Will watch your condition.  Will get help right away if you are not doing well or get worse. Document Released: 09/13/2005 Document Revised: 12/06/2011 Document Reviewed: 05/18/2013 [facility] [place] [facility] Patient Information 2015 [place], [place]. This information is not intended to replace advice given to you by your health care provider. Make sure you discuss any questions you have with your health care provider.

## 2015-02-13 ENCOUNTER — Emergency Department (HOSPITAL_COMMUNITY)
Admission: EM | Admit: 2015-02-13 | Discharge: 2015-02-13 | Disposition: A | Discharge status: Home / Self Care | Payer: Medicaid Other | Attending: Emergency Medicine | Admitting: Emergency Medicine

## 2015-02-13 ENCOUNTER — Encounter (HOSPITAL_COMMUNITY): Payer: Self-pay | Admitting: Emergency Medicine

## 2015-02-13 DIAGNOSIS — Z8719 Personal history of other diseases of the digestive system: Secondary | ICD-10-CM | POA: Diagnosis not present

## 2015-02-13 DIAGNOSIS — Z8679 Personal history of other diseases of the circulatory system: Secondary | ICD-10-CM | POA: Diagnosis not present

## 2015-02-13 DIAGNOSIS — Z3202 Encounter for pregnancy test, result negative: Secondary | ICD-10-CM | POA: Insufficient documentation

## 2015-02-13 DIAGNOSIS — Z79899 Other long term (current) drug therapy: Secondary | ICD-10-CM | POA: Insufficient documentation

## 2015-02-13 DIAGNOSIS — Z8742 Personal history of other diseases of the female genital tract: Secondary | ICD-10-CM | POA: Insufficient documentation

## 2015-02-13 DIAGNOSIS — R1012 Left upper quadrant pain: Secondary | ICD-10-CM | POA: Diagnosis not present

## 2015-02-13 LAB — I-STAT CHEM 8, ED
BUN: 15 mg/dL (ref 6–20)
CALCIUM ION: 1.25 mmol/L — AB (ref 1.12–1.23)
CREATININE: 0.8 mg/dL (ref 0.44–1.00)
Chloride: 99 mmol/L — ABNORMAL LOW (ref 101–111)
Glucose, Bld: 78 mg/dL (ref 65–99)
HCT: 45 % (ref 36.0–46.0)
Hemoglobin: 15.3 g/dL — ABNORMAL HIGH (ref 12.0–15.0)
Potassium: 3.8 mmol/L (ref 3.5–5.1)
SODIUM: 139 mmol/L (ref 135–145)
TCO2: 26 mmol/L (ref 0–100)

## 2015-02-13 LAB — CBC WITH DIFFERENTIAL/PLATELET
Basophils Absolute: 0 10*3/uL (ref 0.0–0.1)
Basophils Relative: 0 % (ref 0–1)
EOS ABS: 0.1 10*3/uL (ref 0.0–0.7)
Eosinophils Relative: 1 % (ref 0–5)
HCT: 39.5 % (ref 36.0–46.0)
Hemoglobin: 13 g/dL (ref 12.0–15.0)
LYMPHS ABS: 3.5 10*3/uL (ref 0.7–4.0)
Lymphocytes Relative: 56 % — ABNORMAL HIGH (ref 12–46)
MCH: 29.9 pg (ref 26.0–34.0)
MCHC: 32.9 g/dL (ref 30.0–36.0)
MCV: 90.8 fL (ref 78.0–100.0)
Monocytes Absolute: 0.4 10*3/uL (ref 0.1–1.0)
Monocytes Relative: 6 % (ref 3–12)
NEUTROS PCT: 37 % — AB (ref 43–77)
Neutro Abs: 2.3 10*3/uL (ref 1.7–7.7)
Platelets: 322 10*3/uL (ref 150–400)
RBC: 4.35 MIL/uL (ref 3.87–5.11)
RDW: 12.2 % (ref 11.5–15.5)
WBC: 6.3 10*3/uL (ref 4.0–10.5)

## 2015-02-13 LAB — I-STAT BETA HCG BLOOD, ED (MC, WL, AP ONLY): I-stat hCG, quantitative: <5 m[IU]/mL (ref <5)

## 2015-02-13 NOTE — ED Notes (Signed)
Dr Beaton at bedside 

## 2015-02-13 NOTE — Discharge Instructions (Signed)
Please follow the directions provided. The instructions for your outpatient CT are included. After your exam please follow back up with your primary care doctor to discuss results.  If your symptoms don't improve a GI referral has been included for further evaluation.  Don't hesitate to return for any new, worsening, or concerning symptoms.    SEEK IMMEDIATE MEDICAL CARE IF:  You are bleeding, leaking fluid, or passing tissue from the vagina.  You have increasing pain or cramping.  You have persistent vomiting.  You have painful or bloody urination.  You have a fever.  You notice a decrease in your baby's movements.  You have extreme weakness or feel faint.  You have shortness of breath, with or without abdominal pain.  You develop a severe headache with abdominal pain.  You have abnormal vaginal discharge with abdominal pain.  You have persistent diarrhea.  You have abdominal pain that continues even after rest, or gets worse.

## 2015-02-13 NOTE — ED Notes (Signed)
Pt c/o LUQ pain x's 5 months.  Denies any nausea or vomiting

## 2015-02-13 NOTE — ED Provider Notes (Signed)
CSN: [id number]     Arrival date & time 02/13/15  1712 History   First MD Initiated Contact with Patient 02/13/15 2007     Chief Complaint  Patient presents with  . Abdominal Pain   (Consider location/radiation/quality/duration/timing/severity/associated sxs/prior Treatment) HPI  Veronica Raymond is a 29 year old female presenting with intermittent abdominal pain. She states his pain has been ongoing for 5 months and she is currently being seen by her PCP for management of the abdominal pain. She states she was sent here for further evaluation and a CT scan by her PCP. He started her on acid reducing medicine, she does not remember the name, but she feels like it is not helping so she has stopped taking it. She currently is pain free and reports her pain occurs throughout the day but is most consistent in the morning time of her epigastric area and left upper quadrant of her abdomen. She denies any nausea, vomiting, fevers, chills, vaginal complaints or dysuria.  Past Medical History  Diagnosis Date  . Bartholin gland cyst 05/04/2013  . GERD (gastroesophageal reflux disease)   . Allergic rhinitis   . Migraine without aura, without mention of intractable migraine without mention of status migrainosus 07/09/2013  . Vaginal Pap smear, abnormal    Past Surgical History  Procedure Laterality Date  . Adenoidectomy    . Tympanostomy tube placement    . Tonsillectomy    . Essure tubal ligation  07-05-2014   Family History  Problem Relation Age of Onset  . Hypertension Mother   . Heart disease Father   . Alcohol abuse Father   . Mental retardation Maternal Uncle   . Diabetes Maternal Grandmother   . Hypertension Maternal Grandmother   . Cancer Maternal Grandfather     prostate  . Alcohol abuse Other    History  Substance Use Topics  . Smoking status: Never Smoker   . Smokeless tobacco: Never Used  . Alcohol Use: No     Comment: Occasional   OB History    Gravida Para Term Preterm  AB TAB SAB Ectopic Multiple Living   4 4 4       4      Review of Systems  Constitutional: Negative for fever and chills.  HENT: Negative for sore throat.   Eyes: Negative for visual disturbance.  Respiratory: Negative for cough and shortness of breath.   Cardiovascular: Negative for chest pain and leg swelling.  Gastrointestinal: Positive for abdominal pain. Negative for nausea, vomiting and diarrhea.  Genitourinary: Negative for dysuria.  Musculoskeletal: Negative for myalgias.  Skin: Negative for rash.  Neurological: Negative for weakness, numbness and headaches.      Allergies  Iodinated diagnostic agents and Percocet  Home Medications   Prior to Admission medications   Medication Sig Start Date End Date Taking? Authorizing Provider  Fenugreek 500 MG CAPS Take 3 capsules by mouth 3 (three) times daily.    Historical Provider, MD  ibuprofen (ADVIL,MOTRIN) 600 MG tablet Take 1 tablet (600 mg total) by mouth every 6 (six) hours. Patient not taking: Reported on 08/08/2014 04/08/14   Linda Hedges, DO   BP 97/64 mmHg  Pulse 68  Temp(Src) 97.8 F (36.6 C) (Oral)  Resp 20  SpO2 100%  LMP 02/07/2015 Physical Exam  Constitutional: She appears well-developed and well-nourished. No distress.  HENT:  Head: Normocephalic and atraumatic.  Mouth/Throat: Oropharynx is clear and moist. No oropharyngeal exudate.  Eyes: Conjunctivae are normal.  Neck: Neck supple. No thyromegaly present.  Cardiovascular: Normal rate, regular rhythm and intact distal pulses.   Pulmonary/Chest: Effort normal and breath sounds normal. No respiratory distress. She has no wheezes. She has no rales. She exhibits no tenderness.  Abdominal: Soft. There is no tenderness.  Musculoskeletal: She exhibits no tenderness.  Lymphadenopathy:    She has no cervical adenopathy.  Neurological: She is alert.  Skin: Skin is warm and dry. No rash noted. She is not diaphoretic.  Psychiatric: She has a normal mood and  affect.  Nursing note and vitals reviewed.   ED Course  Procedures (including critical care time) Labs Review Labs Reviewed  CBC WITH DIFFERENTIAL/PLATELET - Abnormal; Notable for the following:    Neutrophils Relative % 37 (*)    Lymphocytes Relative 56 (*)    All other components within normal limits  I-STAT CHEM 8, ED - Abnormal; Notable for the following:    Chloride 99 (*)    Calcium, Ion 1.25 (*)    Hemoglobin 15.3 (*)    All other components within normal limits  I-STAT BETA HCG BLOOD, ED ([facility], [facility], [facility] ONLY)    Imaging Review No results found.   EKG Interpretation None      MDM   Final diagnoses:  Left upper quadrant pain   29 yo with ongoing abd pain sent her from her PCP for CT scan.  Pt currently is pain free. Discussed with pt could order outpt CT but unnecessary for emergency abd pain work-up without symptoms. Pt is in agreement with that plan.  Discussed case with Dr. Audie Pinto. Pt is well-appearing, in no acute distress and vital signs reviewed and not concerning. She appears safe to be discharged.  Discharge include scheduling outpt CT scan and follow-up with their PCP.  Return precautions provided. Pt aware of plan and in agreement.     Filed Vitals:   02/13/15 1721 02/13/15 2023 02/13/15 2120  BP: 104/56 97/64 109/58  Pulse: 65 68 60  Temp: 97.8 F (36.6 C)    TempSrc: Oral    Resp: 14 20 16   SpO2: 100% 100% 100%   Meds given in ED:  Medications - No data to display  Discharge Medication List as of 02/13/2015  9:18 PM         Britt Bottom, NP 02/14/15 1715  Leonard Schwartz, MD 02/14/15 1815

## 2015-02-13 NOTE — ED Notes (Signed)
Asked pt. For urine and unable to void at the moment.

## 2015-08-01 ENCOUNTER — Emergency Department (INDEPENDENT_AMBULATORY_CARE_PROVIDER_SITE_OTHER)
Admission: EM | Admit: 2015-08-01 | Discharge: 2015-08-01 | Disposition: A | Discharge status: Home / Self Care | Payer: Medicaid Other | Source: Home / Self Care | Attending: Family Medicine | Admitting: Family Medicine

## 2015-08-01 ENCOUNTER — Encounter (HOSPITAL_COMMUNITY): Payer: Self-pay | Admitting: *Deleted

## 2015-08-01 DIAGNOSIS — J020 Streptococcal pharyngitis: Secondary | ICD-10-CM | POA: Diagnosis not present

## 2015-08-01 LAB — POCT RAPID STREP A: Streptococcus, Group A Screen (Direct): POSITIVE — AB

## 2015-08-01 MED ORDER — AMOXICILLIN 500 MG PO CAPS: 500.0000 mg | ORAL_CAPSULE | Freq: Three times a day (TID) | ORAL | Status: DC

## 2015-08-01 NOTE — ED Notes (Signed)
Pt  Reports  sorethroat       With a  Headache   Symptoms    Started   Yesterday      Pt  Reports  Some  Swelling  In  Necks   And  Hurts  To  Swallow

## 2015-08-01 NOTE — Discharge Instructions (Signed)
Drink lots of fluids, take all of medicine, use lozenges as needed.return if needed °

## 2015-08-01 NOTE — ED Provider Notes (Signed)
CSN: [id number]     Arrival date & time 08/01/15  1336 History   First MD Initiated Contact with Patient 08/01/15 1415     Chief Complaint  Patient presents with  . Sore Throat   (Consider location/radiation/quality/duration/timing/severity/associated sxs/prior Treatment) Patient is a 29 y.o. female presenting with pharyngitis. The history is provided by the patient.  Sore Throat This is a new problem. The current episode started 6 to 12 hours ago. The problem has been gradually worsening. Associated symptoms include headaches. Pertinent negatives include no chest pain and no abdominal pain. The symptoms are aggravated by swallowing.    Past Medical History  Diagnosis Date  . Bartholin gland cyst 05/04/2013  . GERD (gastroesophageal reflux disease)   . Allergic rhinitis   . Migraine without aura, without mention of intractable migraine without mention of status migrainosus 07/09/2013  . Vaginal Pap smear, abnormal    Past Surgical History  Procedure Laterality Date  . Adenoidectomy    . Tympanostomy tube placement    . Tonsillectomy    . Essure tubal ligation  07-05-2014   Family History  Problem Relation Age of Onset  . Hypertension Mother   . Heart disease Father   . Alcohol abuse Father   . Mental retardation Maternal Uncle   . Diabetes Maternal Grandmother   . Hypertension Maternal Grandmother   . Cancer Maternal Grandfather     prostate  . Alcohol abuse Other    Social History  Substance Use Topics  . Smoking status: Never Smoker   . Smokeless tobacco: Never Used  . Alcohol Use: No     Comment: Occasional   OB History    Gravida Para Term Preterm AB TAB SAB Ectopic Multiple Living   4 4 4       4      Review of Systems  Constitutional: Negative.   HENT: Positive for sore throat.   Cardiovascular: Negative for chest pain.  Gastrointestinal: Negative for abdominal pain.  Skin: Negative.   Neurological: Positive for headaches.  Hematological: Positive for  adenopathy.  All other systems reviewed and are negative.   Allergies  Iodinated diagnostic agents and Percocet  Home Medications   Prior to Admission medications   Medication Sig Start Date End Date Taking? Authorizing Provider  acetaminophen (TYLENOL) 325 MG tablet Take 650 mg by mouth every 6 (six) hours as needed for mild pain.    Historical Provider, MD  amoxicillin (AMOXIL) 500 MG capsule Take 1 capsule (500 mg total) by mouth 3 (three) times daily. 08/01/15   Billy Fischer, MD  ibuprofen (ADVIL,MOTRIN) 600 MG tablet Take 1 tablet (600 mg total) by mouth every 6 (six) hours. 04/08/14   Linda Hedges, DO   Meds Ordered and Administered this Visit  Medications - No data to display  BP 115/73 mmHg  Pulse 82  Temp(Src) 99.3 F (37.4 C) (Oral)  Resp 16  SpO2 100%  LMP 08/01/2015 No data found.   Physical Exam  Constitutional: She is oriented to person, place, and time. She appears well-developed and well-nourished. No distress.  HENT:  Head: Normocephalic.  Right Ear: External ear normal.  Left Ear: External ear normal.  Nose: Nose normal.  Mouth/Throat: Oropharyngeal exudate present.  Eyes: Conjunctivae are normal. Pupils are equal, round, and reactive to light.  Neck: Normal range of motion. Neck supple.  Cardiovascular: Normal heart sounds.   Pulmonary/Chest: Effort normal and breath sounds normal.  Lymphadenopathy:    She has cervical adenopathy.  Neurological:  She is alert and oriented to person, place, and time.  Skin: Skin is warm and dry. No rash noted.  Nursing note and vitals reviewed.   ED Course  Procedures (including critical care time)  Labs Review Labs Reviewed  POCT RAPID STREP A - Abnormal; Notable for the following:    Streptococcus, Group A Screen (Direct) POSITIVE (*)    All other components within normal limits    Imaging Review No results found.   Visual Acuity Review  Right Eye Distance:   Left Eye Distance:   Bilateral Distance:     Right Eye Near:   Left Eye Near:    Bilateral Near:         MDM   1. Streptococcal sore throat        Billy Fischer, MD 08/01/15 [phone]

## 2015-08-18 ENCOUNTER — Encounter (HOSPITAL_COMMUNITY): Payer: Self-pay | Admitting: Emergency Medicine

## 2015-08-18 ENCOUNTER — Encounter (HOSPITAL_COMMUNITY): Payer: Self-pay | Admitting: *Deleted

## 2015-08-18 ENCOUNTER — Emergency Department (INDEPENDENT_AMBULATORY_CARE_PROVIDER_SITE_OTHER)
Admission: EM | Admit: 2015-08-18 | Discharge: 2015-08-18 | Discharge status: Against Medical Advice | Payer: Medicaid Other | Source: Home / Self Care

## 2015-08-18 ENCOUNTER — Emergency Department (HOSPITAL_COMMUNITY): Payer: Medicaid Other

## 2015-08-18 ENCOUNTER — Emergency Department (HOSPITAL_COMMUNITY)
Admission: EM | Admit: 2015-08-18 | Discharge: 2015-08-19 | Disposition: A | Discharge status: Home / Self Care | Payer: Medicaid Other | Attending: Emergency Medicine | Admitting: Emergency Medicine

## 2015-08-18 DIAGNOSIS — K219 Gastro-esophageal reflux disease without esophagitis: Secondary | ICD-10-CM

## 2015-08-18 DIAGNOSIS — M546 Pain in thoracic spine: Secondary | ICD-10-CM

## 2015-08-18 DIAGNOSIS — Z8679 Personal history of other diseases of the circulatory system: Secondary | ICD-10-CM | POA: Diagnosis not present

## 2015-08-18 DIAGNOSIS — R079 Chest pain, unspecified: Secondary | ICD-10-CM | POA: Diagnosis present

## 2015-08-18 DIAGNOSIS — Z79899 Other long term (current) drug therapy: Secondary | ICD-10-CM | POA: Insufficient documentation

## 2015-08-18 LAB — BASIC METABOLIC PANEL
Anion gap: 7 (ref 5–15)
BUN: 11 mg/dL (ref 6–20)
CHLORIDE: 102 mmol/L (ref 101–111)
CO2: 29 mmol/L (ref 22–32)
CREATININE: 0.81 mg/dL (ref 0.44–1.00)
Calcium: 9.6 mg/dL (ref 8.9–10.3)
GFR calc Af Amer: >60 mL/min (ref >60)
GFR calc non Af Amer: >60 mL/min (ref >60)
Glucose, Bld: 80 mg/dL (ref 65–99)
Potassium: 3.8 mmol/L (ref 3.5–5.1)
SODIUM: 138 mmol/L (ref 135–145)

## 2015-08-18 LAB — CBC
HCT: 39.2 % (ref 36.0–46.0)
Hemoglobin: 13.2 g/dL (ref 12.0–15.0)
MCH: 30.7 pg (ref 26.0–34.0)
MCHC: 33.7 g/dL (ref 30.0–36.0)
MCV: 91.2 fL (ref 78.0–100.0)
PLATELETS: 351 10*3/uL (ref 150–400)
RBC: 4.3 MIL/uL (ref 3.87–5.11)
RDW: 12.7 % (ref 11.5–15.5)
WBC: 8 10*3/uL (ref 4.0–10.5)

## 2015-08-18 LAB — I-STAT TROPONIN, ED: Troponin i, poc: 0 ng/mL (ref 0.00–0.08)

## 2015-08-18 MED ORDER — GI COCKTAIL ~~LOC~~
30.0000 mL | Freq: Once | ORAL | Status: AC
  Administered 2015-08-18: 30 mL via ORAL

## 2015-08-18 MED ORDER — GI COCKTAIL ~~LOC~~
ORAL | Status: AC
  Filled 2015-08-18: qty 30

## 2015-08-18 NOTE — Discharge Instructions (Signed)
Esophageal Function Studies If you continue to have chest tightness you should go to the ER for treatment!! Esophageal function studies are tests that measure how well the esophagus is working. The esophagus is a muscular tube that connects the mouth to the stomach. When the esophagus is working properly, it moves food and liquid smoothly into the stomach and keeps it from coming back up into the mouth. It also keeps stomach juices from flowing out of the stomach into the esophagus (reflux). Esophageal function studies are often ordered when a person has symptoms that suggest that the esophagus is not working properly, such as:  Trouble swallowing (dysphagia).  Heartburn  Chest pain with reflux.  Choking.  A sore throat.  Coughing. LET [facility] CARE PROVIDER KNOW ABOUT:  Any allergies you have.  All medicines you are taking, including vitamins, herbs, eye drops, creams, and over-the-counter medicines.  Previous problems you or members of your family have had with the use of anesthetics.  Any blood disorders you have.  Previous surgeries you have had.  Medical conditions you have.  The possibility of pregnancy. RISKS AND COMPLICATIONS Generally, this is a safe procedure. However, problems may occur, including:  Sore throat.  Nosebleed.  Pneumonia from esophageal fluid that gets into your windpipe and lungs (aspiration pneumonia).  A hole (perforation) in the esophagus. (This is rare.) BEFORE THE PROCEDURE  Ask your health care provider about:  Changing or stopping your regular medicines. This is especially important if you are taking diabetes medicines or blood thinners.  Taking medicines such as aspirin and ibuprofen. These medicines can thin your blood. Do not take these medicines before your procedure if your health care provider instructs you not to.  Follow your health care provider's instructions about eating or drinking restrictions. PROCEDURE  A numbing  medicine (local anesthetic) will be applied or sprayed inside your nose and down your throat.  A long, thin, pressure-sensing tube will be put through your nose and down into your esophagus. You will be asked to swallow as the tube goes in.  Pressure measurements will be taken in different parts of your esophagus.  You will be given some fluid to swallow and be asked to change your position a few times during the procedure.  Another small tube will be passed through your nose into your esophagus to measure pH.  An acid solution will be put into the tube to see if it causes you to feel discomfort.  Acid samples will be taken.  The acid level and the length of time that it takes to clear the acid from your esophagus will be measured. The procedure may vary among health care providers and hospitals. AFTER THE PROCEDURE  Keep all follow-up visits as directed by your health care provider. This is important.  Let your health care provider know if you have:  Bleeding from your nose.  An ongoing (persistent) sore throat or coughing.  Chest pain.  A fever.  Trouble swallowing.   This information is not intended to replace advice given to you by your health care provider. Make sure you discuss any questions you have with your health care provider.   Document Released: 01/14/2005 Document Revised: 10/04/2014 Document Reviewed: 07/03/2014 Elsevier Interactive Patient Education 2016 [employer] Hiatal Hernia A hiatal hernia occurs when part of your stomach slides above the muscle that separates your abdomen from your chest (diaphragm). You can be born with a hiatal hernia (congenital), or it may develop over time. In almost  all cases of hiatal hernia, only the top part of the stomach pushes through.  Many people have a hiatal hernia with no symptoms. The larger the hernia, the more likely that you will have symptoms. In some cases, a hiatal hernia allows stomach acid to flow back into the  tube that carries food from your mouth to your stomach (esophagus). This may cause heartburn symptoms. Severe heartburn symptoms may mean you have developed a condition called gastroesophageal reflux disease (GERD).  CAUSES  Hiatal hernias are caused by a weakness in the opening (hiatus) where your esophagus passes through your diaphragm to attach to the upper part of your stomach. You may be born with a weakness in your hiatus, or a weakness can develop. RISK FACTORS Older age is a major risk factor for a hiatal hernia. Anything that increases pressure on your diaphragm can also increase your risk of a hiatal hernia. This includes:  Pregnancy.  Excess weight.  Frequent constipation. SIGNS AND SYMPTOMS  People with a hiatal hernia often have no symptoms. If symptoms develop, they are almost always caused by GERD. They may include:  Heartburn.  Belching.  Indigestion.  Trouble swallowing.  Coughing or wheezing.  Sore throat.  Hoarseness.  Chest pain. DIAGNOSIS  A hiatal hernia is sometimes found during an exam for another problem. Your health care provider may suspect a hiatal hernia if you have symptoms of GERD. Tests may be done to diagnose GERD. These may include:  X-rays of your stomach or chest.  An upper gastrointestinal (GI) series. This is an X-ray exam of your GI tract involving the use of a chalky liquid that you swallow. The liquid shows up clearly on the X-ray.  Endoscopy. This is a procedure to look into your stomach using a thin, flexible tube that has a tiny camera and light on the end of it. TREATMENT  If you have no symptoms, you may not need treatment. If you have symptoms, treatment may include:  Dietary and lifestyle changes to help reduce GERD symptoms.  Medicines. These may include:  Over-the-counter antacids.  Medicines that make your stomach empty more quickly.  Medicines that block the production of stomach acid (H2 blockers).  Stronger  medicines to reduce stomach acid (proton pump inhibitors).  You may need surgery to repair the hernia if other treatments are not helping. HOME CARE INSTRUCTIONS   Take all medicines as directed by your health care provider.  Quit smoking, if you smoke.  Try to achieve and maintain a healthy body weight.  Eat frequent small meals instead of three large meals a day. This keeps your stomach from getting too full.  Eat slowly.  Do not lie down right after eating.  Do noteat 1-2 hours before bed.   Do not drink beverages with caffeine. These include cola, coffee, cocoa, and tea.  Do not drink alcohol.  Avoid foods that can make symptoms of GERD worse. These may include:  Fatty foods.  Citrus fruits.  Other foods and drinks that contain acid.  Avoid putting pressure on your belly. Anything that puts pressure on your belly increases the amount of acid that may be pushed up into your esophagus.   Avoid bending over, especially after eating.  Raise the head of your bed by putting blocks under the legs. This keeps your head and esophagus higher than your stomach.  Do not wear tight clothing around your chest or stomach.  Try not to strain when having a bowel movement, when urinating,  or when lifting heavy objects. SEEK MEDICAL CARE IF:  Your symptoms are not controlled with medicines or lifestyle changes.  You are having trouble swallowing.  You have coughing or wheezing that will not go away. SEEK IMMEDIATE MEDICAL CARE IF:  Your pain is getting worse.  Your pain spreads to your arms, neck, jaw, teeth, or back.  You have shortness of breath.  You sweat for no reason.  You feel sick to your stomach (nauseous) or vomit.  You vomit blood.  You have bright red blood in your stools.  You have black, tarry stools.    This information is not intended to replace advice given to you by your health care provider. Make sure you discuss any questions you have with  your health care provider.   Document Released: 12/04/2003 Document Revised: 10/04/2014 Document Reviewed: 08/31/2013 Elsevier Interactive Patient Education [employer].

## 2015-08-18 NOTE — ED Notes (Addendum)
Pt c/o acid reflux onset 2 days; taking pantoprazole 40 mg w/no relief.  Chest discomfort increases w/food and activity Denies HA, SOB, dyspnea, diaphoresis A&O x4... No acute distress.

## 2015-08-18 NOTE — ED Provider Notes (Signed)
CSN: [id number]     Arrival date & time 08/18/15  1817 History   None    Chief Complaint  Patient presents with  . Gastroesophageal Reflux   (Consider location/radiation/quality/duration/timing/severity/associated sxs/prior Treatment) HPI History obtained from patient:   LOCATION:mid sternum SEVERITY:2 DURATION:2 days CONTEXT:onset after eating spicy [place] food  QUALITY: tightness MODIFYING FACTORS: none ASSOCIATED SYMPTOMS: none TIMING:constant OCCUPATION:food service  Past Medical History  Diagnosis Date  . Bartholin gland cyst 05/04/2013  . GERD (gastroesophageal reflux disease)   . Allergic rhinitis   . Migraine without aura, without mention of intractable migraine without mention of status migrainosus 07/09/2013  . Vaginal Pap smear, abnormal    Past Surgical History  Procedure Laterality Date  . Adenoidectomy    . Tympanostomy tube placement    . Tonsillectomy    . Essure tubal ligation  07-05-2014   Family History  Problem Relation Age of Onset  . Hypertension Mother   . Heart disease Father   . Alcohol abuse Father   . Mental retardation Maternal Uncle   . Diabetes Maternal Grandmother   . Hypertension Maternal Grandmother   . Cancer Maternal Grandfather     prostate  . Alcohol abuse Other    Social History  Substance Use Topics  . Smoking status: Never Smoker   . Smokeless tobacco: Never Used  . Alcohol Use: No     Comment: Occasional   OB History    Gravida Para Term Preterm AB TAB SAB Ectopic Multiple Living   4 4 4       4      Review of Systems ROS +'ve chest tightness  Denies: HEADACHE, NAUSEA, ABDOMINAL PAIN,  CONGESTION, DYSURIA, SHORTNESS OF BREATH  Allergies  Iodinated diagnostic agents and Percocet  Home Medications   Prior to Admission medications   Medication Sig Start Date End Date Taking? Authorizing Provider  acetaminophen (TYLENOL) 325 MG tablet Take 650 mg by mouth every 6 (six) hours as needed for mild pain.     Historical Provider, MD  amoxicillin (AMOXIL) 500 MG capsule Take 1 capsule (500 mg total) by mouth 3 (three) times daily. 08/01/15   Billy Fischer, MD  ibuprofen (ADVIL,MOTRIN) 600 MG tablet Take 1 tablet (600 mg total) by mouth every 6 (six) hours. 04/08/14   Linda Hedges, DO   Meds Ordered and Administered this Visit   Medications  gi cocktail (Maalox,Lidocaine,Donnatal) (not administered)    BP 111/75 mmHg  Pulse 70  Temp(Src) 98.4 F (36.9 C) (Oral)  Resp 18  SpO2 98%  LMP 08/01/2015 No data found.   Physical Exam NURSES NOTES AND VITAL SIGNS REVIEWED. CONSTITUTIONAL: Well developed, well nourished, no acute distress HEENT: normocephalic, atraumatic EYES: Conjunctiva normal NECK:normal ROM, supple PULMONARY:No respiratory distress, normal effort, Lungs: CTAb/l CARDIOVASCULAR: RRR, no murmur ABDOMEN: soft, ND, NT, +'ve BS MUSCULOSKELETAL: Normal ROM of all extremities SKIN: warm and dry without rash PSYCHIATRIC: Mood and affect normal  ED Course  Procedures (including critical care time) Independent review of ECG: NSR without acute changes Labs Review Labs Reviewed - No data to display  Imaging Review No results found.   Visual Acuity Review  Right Eye Distance:   Left Eye Distance:   Bilateral Distance:    Right Eye Near:   Left Eye Near:    Bilateral Near:         MDM   1. Gastroesophageal reflux disease without esophagitis    Pt is given GI cocktail, and states that symptoms are much  better. Discussed angina, etc, but this discomfort was constant, not worsened with work or activity, no related shortness of breath, sweats, arm or back pain.    Konrad Felix, [place] 08/19/15 [phone]

## 2015-08-18 NOTE — ED Provider Notes (Signed)
CSN: [id number]     Arrival date & time 08/18/15  2024 History   First MD Initiated Contact with Patient 08/18/15 2302     Chief Complaint  Patient presents with  . Chest Pain     (Consider location/radiation/quality/duration/timing/severity/associated sxs/prior Treatment) The history is provided by the patient and medical records. No language interpreter was used.   Veronica Raymond is a 29 y.o. female  with a PMH of GERD, migraines who presents to the Emergency Department complaining of acute onset of constant, waxing-waning sharp chest pain with radiation to the back since 08/16/15.  Seen at urgent care today and given GI cocktail - has also taken an otc antacid as well - both with no relief.   Past Medical History  Diagnosis Date  . Bartholin gland cyst 05/04/2013  . GERD (gastroesophageal reflux disease)   . Allergic rhinitis   . Migraine without aura, without mention of intractable migraine without mention of status migrainosus 07/09/2013  . Vaginal Pap smear, abnormal    Past Surgical History  Procedure Laterality Date  . Adenoidectomy    . Tympanostomy tube placement    . Tonsillectomy    . Essure tubal ligation  07-05-2014   Family History  Problem Relation Age of Onset  . Hypertension Mother   . Heart disease Father   . Alcohol abuse Father   . Mental retardation Maternal Uncle   . Diabetes Maternal Grandmother   . Hypertension Maternal Grandmother   . Cancer Maternal Grandfather     prostate  . Alcohol abuse Other    Social History  Substance Use Topics  . Smoking status: Never Smoker   . Smokeless tobacco: Never Used  . Alcohol Use: No     Comment: Occasional   OB History    Gravida Para Term Preterm AB TAB SAB Ectopic Multiple Living   4 4 4       4      Review of Systems  Constitutional: Negative.   HENT: Negative for congestion, rhinorrhea and sore throat.   Eyes: Negative for visual disturbance.  Respiratory: Negative for cough, shortness of  breath and wheezing.   Cardiovascular: Positive for chest pain.  Gastrointestinal: Negative for nausea, vomiting, abdominal pain, diarrhea and constipation.  Genitourinary: Negative for dysuria, urgency and frequency.  Musculoskeletal: Positive for back pain. Negative for myalgias, arthralgias and neck pain.  Skin: Negative for rash.  Neurological: Negative for dizziness, weakness and headaches.      Allergies  Iodinated diagnostic agents and Percocet  Home Medications   Prior to Admission medications   Medication Sig Start Date End Date Taking? Authorizing Provider  pantoprazole (PROTONIX) 20 MG tablet Take 20 mg by mouth daily.   Yes Historical Provider, MD  naproxen (NAPROSYN) 500 MG tablet Take 1 tablet (500 mg total) by mouth 2 (two) times daily. 08/19/15   Jaime Pilcher Ward, PA-C   BP 107/64 mmHg  Pulse 75  Temp(Src) 98.6 F (37 C) (Oral)  Resp 20  Ht 5\' 5"  (1.651 m)  Wt 71.243 kg  BMI 26.14 kg/m2  SpO2 100%  LMP 08/01/2015 Physical Exam  Constitutional: She is oriented to person, place, and time. She appears well-developed and well-nourished.  Alert and in no acute distress  HENT:  Head: Normocephalic and atraumatic.  Cardiovascular: Normal rate, regular rhythm, normal heart sounds and intact distal pulses.  Exam reveals no gallop and no friction rub.   No murmur heard. Pulmonary/Chest: Effort normal and breath sounds normal. No respiratory  distress. She has no wheezes. She has no rales. She exhibits no tenderness.  Abdominal: She exhibits no mass. There is no rebound and no guarding.  Abdomen soft, non-tender, non-distended Bowel sounds positive in all four quadrants  Musculoskeletal: She exhibits no edema.       Back:  Neurological: She is alert and oriented to person, place, and time.  Skin: Skin is warm and dry. No rash noted.  Psychiatric: She has a normal mood and affect. Her behavior is normal. Judgment and thought content normal.  Nursing note and  vitals reviewed.   ED Course  Procedures (including critical care time) Labs Review Labs Reviewed  BASIC METABOLIC PANEL  [place], ED    Imaging Review Dg Chest 2 View  08/18/2015  CLINICAL DATA:  29 year old with 2 day history of mid chest pain, worse with standing. EXAM: CHEST  2 VIEW COMPARISON:  01/28/2010. FINDINGS: Cardiac silhouette upper normal in size, unchanged. Hilar and mediastinal contours unremarkable. Lungs clear. Bronchovascular markings normal. Pulmonary vascularity normal. No visible pleural effusions. No pneumothorax. Visualized bony thorax intact. No significant interval change. IMPRESSION: No acute cardiopulmonary disease.  Stable examination. Electronically Signed   By: Evangeline Dakin M.D.   On: 08/18/2015 21:06   I have personally reviewed and evaluated these images and lab results as part of my medical decision-making.   EKG Interpretation None      MDM   Final diagnoses:  Bilateral thoracic back pain  Chest pain, unspecified chest pain type   Franco Collet presents from urgent care for chest pain/back pain not relieved by antacids and gi cocktail. Exacerbation of GERD vs. [place] pain vs. Anxiety. Strep 3 weeks prior - low suspicion for pericarditis, but if pain persist, this should be considered. Protonix and toradol given here.   Labs: cbc, bmp, and trop wdl CXR with no acute cardiopulm dz PERC negative  1:19 AM - Patient reassessed; states mild improvement of pain. Will dc home with naproxen, recommendations to take protonix rx regularly and strict follow up with her GI doctor she has been seeing. Informed patient if this does not improve to return.    I explained the diagnosis and have given precautions as to when/if to return to the ER including for any other new or worsening symptoms. The patient understands and accepts the medical plan as it's been dictated and I have answered her questions. Discharge instructions concerning home  care and prescriptions have been given. The patient is stable and is discharged to home in good condition.  Patient discussed with Dr. Randal Buba who agrees with treatment plan.      Ozella Almond Ward, PA-C 08/19/15 0141  Veatrice Kells, MD 08/19/15 [phone]

## 2015-08-18 NOTE — ED Notes (Signed)
Pt c/o central CP with radiation to her back x 2 days.

## 2015-08-19 ENCOUNTER — Encounter (HOSPITAL_COMMUNITY): Payer: Self-pay | Admitting: Emergency Medicine

## 2015-08-19 MED ORDER — KETOROLAC TROMETHAMINE 60 MG/2ML IM SOLN
60.0000 mg | Freq: Once | INTRAMUSCULAR | Status: AC
  Administered 2015-08-19: 60 mg via INTRAMUSCULAR
  Filled 2015-08-19: qty 2

## 2015-08-19 MED ORDER — PANTOPRAZOLE SODIUM 20 MG PO TBEC
20.0000 mg | DELAYED_RELEASE_TABLET | Freq: Once | ORAL | Status: AC
  Administered 2015-08-19: 20 mg via ORAL
  Filled 2015-08-19: qty 1

## 2015-08-19 MED ORDER — NAPROXEN 500 MG PO TABS: 500.0000 mg | ORAL_TABLET | Freq: Two times a day (BID) | ORAL | Status: DC

## 2015-08-19 NOTE — Discharge Instructions (Signed)
1. Medications: Take naproxen as needed for pain, continue usual home medications 2. Treatment: rest, drink plenty of fluids, eat diet that it easy on your stomach: try to avoid caffeine, spicy foods, greasy foods, and alcohol 3. Follow Up: Please follow up with your primary doctor in 3 days for discussion of your diagnoses and further evaluation after today's visit; I would also like you to follow up with the GI doctor you have been seeing at their earliest appointment; Please return to the ER for fever, new or worsening symptoms, any additional concerns.

## 2015-08-19 NOTE — ED Notes (Signed)
Discharge instructions and prescription reviewed - voiced understanding.  

## 2015-09-26 ENCOUNTER — Emergency Department (HOSPITAL_COMMUNITY)
Admission: EM | Admit: 2015-09-26 | Discharge: 2015-09-26 | Disposition: A | Discharge status: Home / Self Care | Payer: Medicaid Other | Source: Home / Self Care | Attending: Family Medicine | Admitting: Family Medicine

## 2015-09-26 ENCOUNTER — Encounter (HOSPITAL_COMMUNITY): Payer: Self-pay | Admitting: *Deleted

## 2015-09-26 ENCOUNTER — Other Ambulatory Visit (HOSPITAL_COMMUNITY)
Admission: RE | Admit: 2015-09-26 | Discharge: 2015-09-26 | Disposition: A | Discharge status: Home / Self Care | Payer: Medicaid Other | Source: Ambulatory Visit | Attending: Family Medicine | Admitting: Family Medicine

## 2015-09-26 DIAGNOSIS — N760 Acute vaginitis: Secondary | ICD-10-CM | POA: Diagnosis not present

## 2015-09-26 DIAGNOSIS — B9689 Other specified bacterial agents as the cause of diseases classified elsewhere: Secondary | ICD-10-CM

## 2015-09-26 DIAGNOSIS — A499 Bacterial infection, unspecified: Secondary | ICD-10-CM | POA: Diagnosis not present

## 2015-09-26 DIAGNOSIS — Z113 Encounter for screening for infections with a predominantly sexual mode of transmission: Secondary | ICD-10-CM | POA: Diagnosis not present

## 2015-09-26 LAB — POCT URINALYSIS DIP (DEVICE)
Bilirubin Urine: NEGATIVE
Glucose, UA: NEGATIVE mg/dL
HGB URINE DIPSTICK: NEGATIVE
Ketones, ur: NEGATIVE mg/dL
Leukocytes, UA: NEGATIVE
NITRITE: NEGATIVE
PH: 6 (ref 5.0–8.0)
Protein, ur: NEGATIVE mg/dL
Specific Gravity, Urine: >=1.03 (ref 1.005–1.030)
UROBILINOGEN UA: 1 mg/dL (ref 0.0–1.0)

## 2015-09-26 LAB — POCT PREGNANCY, URINE: Preg Test, Ur: NEGATIVE

## 2015-09-26 MED ORDER — METRONIDAZOLE 500 MG PO TABS: 500.0000 mg | ORAL_TABLET | Freq: Two times a day (BID) | ORAL | Status: DC

## 2015-09-26 NOTE — ED Provider Notes (Signed)
CSN: [id number]     Arrival date & time 09/26/15  1653 History   First MD Initiated Contact with Patient 09/26/15 1840     Chief Complaint  Patient presents with  . Vaginal Discharge   (Consider location/radiation/quality/duration/timing/severity/associated sxs/prior Treatment) Patient is a 29 y.o. female presenting with vaginal discharge. The history is provided by the patient.  Vaginal Discharge Quality:  Malodorous and watery Severity:  Mild Onset quality:  Gradual Duration:  4 days Progression:  Unchanged Chronicity:  New Relieved by:  None tried Worsened by:  Nothing tried Ineffective treatments:  None tried Associated symptoms: abdominal pain   Associated symptoms: no dysuria, no fever, no nausea and no vomiting     Past Medical History  Diagnosis Date  . Bartholin gland cyst 05/04/2013  . GERD (gastroesophageal reflux disease)   . Allergic rhinitis   . Migraine without aura, without mention of intractable migraine without mention of status migrainosus 07/09/2013  . Vaginal Pap smear, abnormal    Past Surgical History  Procedure Laterality Date  . Adenoidectomy    . Tympanostomy tube placement    . Tonsillectomy    . Essure tubal ligation  07-05-2014   Family History  Problem Relation Age of Onset  . Hypertension Mother   . Heart disease Father   . Alcohol abuse Father   . Mental retardation Maternal Uncle   . Diabetes Maternal Grandmother   . Hypertension Maternal Grandmother   . Cancer Maternal Grandfather     prostate  . Alcohol abuse Other    Social History  Substance Use Topics  . Smoking status: Never Smoker   . Smokeless tobacco: Never Used  . Alcohol Use: No     Comment: Occasional   OB History    Gravida Para Term Preterm AB TAB SAB Ectopic Multiple Living   4 4 4       4      Review of Systems  Constitutional: Negative.  Negative for fever.  Gastrointestinal: Positive for abdominal pain. Negative for nausea and vomiting.  Genitourinary:  Positive for vaginal discharge. Negative for dysuria, urgency, vaginal bleeding and pelvic pain.  All other systems reviewed and are negative.   Allergies  Iodinated diagnostic agents and Percocet  Home Medications   Prior to Admission medications   Medication Sig Start Date End Date Taking? Authorizing Provider  metroNIDAZOLE (FLAGYL) 500 MG tablet Take 1 tablet (500 mg total) by mouth 2 (two) times daily. 09/26/15   Billy Fischer, MD  naproxen (NAPROSYN) 500 MG tablet Take 1 tablet (500 mg total) by mouth 2 (two) times daily. 08/19/15   Jaime Pilcher Ward, PA-C  pantoprazole (PROTONIX) 20 MG tablet Take 20 mg by mouth daily.    Historical Provider, MD   Meds Ordered and Administered this Visit  Medications - No data to display  BP 109/73 mmHg  Pulse 82  Temp(Src) 97.6 F (36.4 C) (Oral)  Resp 16  SpO2 100% No data found.   Physical Exam  Constitutional: She is oriented to person, place, and time. She appears well-developed and well-nourished.  Abdominal: Soft. Bowel sounds are normal. She exhibits no distension and no mass. There is no tenderness. There is no rebound and no guarding. Hernia confirmed negative in the right inguinal area and confirmed negative in the left inguinal area.  Genitourinary: Pelvic exam was performed with patient supine. No erythema or tenderness in the vagina. No foreign body around the vagina. Vaginal discharge found.  Lymphadenopathy:  Right: No inguinal adenopathy present.       Left: No inguinal adenopathy present.  Neurological: She is alert and oriented to person, place, and time.  Skin: Skin is warm and dry.  Nursing note and vitals reviewed.   ED Course  Procedures (including critical care time)  Labs Review Labs Reviewed  POCT URINALYSIS DIP (DEVICE)  POCT PREGNANCY, URINE  CERVICOVAGINAL ANCILLARY ONLY    Imaging Review No results found.   Visual Acuity Review  Right Eye Distance:   Left Eye Distance:   Bilateral  Distance:    Right Eye Near:   Left Eye Near:    Bilateral Near:         MDM   1. BV (bacterial vaginosis)        Billy Fischer, MD 09/26/15 1901

## 2015-09-26 NOTE — ED Notes (Signed)
Pt  Reports  Low    abd pain   With  Vaginal  Discharge    With    Symptoms  X  4  Days      ambulated  To  Room   With a  Slow  Steady  Gait

## 2015-09-26 NOTE — Discharge Instructions (Signed)
Use medicine as prescribed, we will call if tests show a need for other treatment.  

## 2015-09-30 LAB — CERVICOVAGINAL ANCILLARY ONLY
Chlamydia: NEGATIVE
Neisseria Gonorrhea: NEGATIVE
Wet Prep (BD Affirm): POSITIVE — AB

## 2015-09-30 NOTE — ED Notes (Signed)
Patient called to inquire about lab reports. After verifying ID discussed negative reports , positive BV report. Advised to complete rx as written

## 2015-12-16 ENCOUNTER — Encounter (HOSPITAL_COMMUNITY): Payer: Self-pay | Admitting: Emergency Medicine

## 2015-12-16 ENCOUNTER — Emergency Department (HOSPITAL_COMMUNITY)
Admission: EM | Admit: 2015-12-16 | Discharge: 2015-12-17 | Disposition: A | Discharge status: Home / Self Care | Payer: Medicaid Other | Attending: Emergency Medicine | Admitting: Emergency Medicine

## 2015-12-16 DIAGNOSIS — H53149 Visual discomfort, unspecified: Secondary | ICD-10-CM | POA: Diagnosis not present

## 2015-12-16 DIAGNOSIS — R109 Unspecified abdominal pain: Secondary | ICD-10-CM | POA: Diagnosis not present

## 2015-12-16 DIAGNOSIS — R51 Headache: Secondary | ICD-10-CM | POA: Diagnosis present

## 2015-12-16 DIAGNOSIS — R110 Nausea: Secondary | ICD-10-CM | POA: Diagnosis not present

## 2015-12-16 NOTE — ED Notes (Signed)
Pt transported from home by EMS c/o HA with light sensitivity, abd pain +nausea, onset 1700

## 2016-09-10 ENCOUNTER — Other Ambulatory Visit: Payer: Self-pay | Admitting: Physician Assistant

## 2016-09-10 DIAGNOSIS — R0789 Other chest pain: Secondary | ICD-10-CM

## 2016-09-10 DIAGNOSIS — R1012 Left upper quadrant pain: Secondary | ICD-10-CM

## 2016-09-15 ENCOUNTER — Other Ambulatory Visit: Payer: Medicaid Other

## 2016-09-21 ENCOUNTER — Other Ambulatory Visit: Payer: Medicaid Other

## 2016-12-01 ENCOUNTER — Ambulatory Visit (HOSPITAL_COMMUNITY)
Admission: EM | Admit: 2016-12-01 | Discharge: 2016-12-01 | Disposition: A | Discharge status: Home / Self Care | Payer: Medicaid Other | Attending: Family Medicine | Admitting: Family Medicine

## 2016-12-01 ENCOUNTER — Encounter (HOSPITAL_COMMUNITY): Payer: Self-pay | Admitting: *Deleted

## 2016-12-01 DIAGNOSIS — K5901 Slow transit constipation: Secondary | ICD-10-CM | POA: Diagnosis not present

## 2016-12-01 NOTE — ED Provider Notes (Signed)
[facility]    CSN: [id number] Arrival date & time: 12/01/16  1115     History   Chief Complaint Chief Complaint  Patient presents with  . Abdominal Pain    HPI Veronica Raymond is a 31 y.o. female.     Is a 31 year old woman who has some left-sided abdominal pain that started today.  She began an exercise program along with a fiber supplement and probiotic about 3 weeks ago..      Past Medical History:  Diagnosis Date  . Allergic rhinitis   . Bartholin gland cyst 05/04/2013  . GERD (gastroesophageal reflux disease)   . Migraine without aura, without mention of intractable migraine without mention of status migrainosus 07/09/2013  . Vaginal Pap smear, abnormal     Patient Active Problem List   Diagnosis Date Noted  . Active labor 04/06/2014  . SVD (spontaneous vaginal delivery) 04/06/2014  . Migraine without aura, without mention of intractable migraine without mention of status migrainosus 07/09/2013    Past Surgical History:  Procedure Laterality Date  . ADENOIDECTOMY    . ESSURE TUBAL LIGATION  07-05-2014  . TONSILLECTOMY    . TYMPANOSTOMY TUBE PLACEMENT      OB History    Gravida Para Term Preterm AB Living   4 4 4     4    SAB TAB Ectopic Multiple Live Births           4       Home Medications    Prior to Admission medications   Medication Sig Start Date End Date Taking? Authorizing Provider  pantoprazole (PROTONIX) 20 MG tablet Take 20 mg by mouth daily.    Historical Provider, MD    Family History Family History  Problem Relation Age of Onset  . Hypertension Mother   . Heart disease Father   . Alcohol abuse Father   . Mental retardation Maternal Uncle   . Diabetes Maternal Grandmother   . Hypertension Maternal Grandmother   . Cancer Maternal Grandfather     prostate  . Alcohol abuse Other     Social History Social History  Substance Use Topics  . Smoking status: Never Smoker  . Smokeless tobacco: Never Used  .  Alcohol use Yes     Comment: Occasional     Allergies   Iodinated diagnostic agents and Percocet [oxycodone-acetaminophen]   Review of Systems Review of Systems  Constitutional: Negative.   HENT: Negative.   Respiratory: Negative.   Cardiovascular: Negative.   Gastrointestinal: Positive for abdominal pain.  Genitourinary: Negative.   Musculoskeletal: Negative.   Neurological: Negative.      Physical Exam Triage Vital Signs ED Triage Vitals [12/01/16 1240]  Enc Vitals Group     BP 100/70     Pulse Rate 78     Resp 18     Temp 98.6 F (37 C)     Temp Source Oral     SpO2 100 %     Weight      Height      Head Circumference      Peak Flow      Pain Score      Pain Loc      Pain Edu?      Excl. in [place]?    No data found.   Updated Vital Signs BP 100/70 (BP Location: Right Arm)   Pulse 78   Temp 98.6 F (37 C) (Oral)   Resp 18   LMP 12/01/2016  SpO2 100%    Physical Exam  Constitutional: She is oriented to person, place, and time. She appears well-developed and well-nourished.  HENT:  Head: Normocephalic.  Right Ear: External ear normal.  Left Ear: External ear normal.  Mouth/Throat: Oropharynx is clear and moist.  Eyes: Conjunctivae and EOM are normal. Pupils are equal, round, and reactive to light.  Neck: Normal range of motion. Neck supple.  Cardiovascular: Normal rate, regular rhythm and normal heart sounds.   Pulmonary/Chest: Effort normal.  Abdominal: Soft. Bowel sounds are normal.  Musculoskeletal: Normal range of motion.  Neurological: She is alert and oriented to person, place, and time.  Skin: Skin is warm and dry.  Nursing note and vitals reviewed.    UC Treatments / Results  Labs (all labs ordered are listed, but only abnormal results are displayed) Labs Reviewed - No data to display  EKG  EKG Interpretation None       Radiology No results found.  Procedures Procedures (including critical care time)  Medications Ordered  in UC Medications - No data to display   Initial Impression / Assessment and Plan / UC Course  I have reviewed the triage vital signs and the nursing notes.  Pertinent labs & imaging results that were available during my care of the patient were reviewed by me and considered in my medical decision making (see chart for details).   Final Clinical Impressions(s) / UC Diagnoses   Final diagnoses:  Slow transit constipation    New Prescriptions Current Discharge Medication List       Robyn Haber, MD 12/01/16 1255

## 2016-12-01 NOTE — Discharge Instructions (Signed)
You need to drink more water when you're taking fiber. It probably would help to take some MiraLAX for couple days to help with your current symptoms.

## 2016-12-01 NOTE — ED Triage Notes (Signed)
Pt  Reports   Pain   l  Side   Abdomen   When  She  Stretches    Feels  Like  l  Side  Of  Abdomen pulsating      Pt  Reports   Recent  excersing  Started  3  Weeks  Ago  Started  [facility]   Supplement    And     probobiatic

## 2016-12-09 ENCOUNTER — Encounter (HOSPITAL_COMMUNITY): Payer: Self-pay | Admitting: Emergency Medicine

## 2016-12-09 DIAGNOSIS — K2900 Acute gastritis without bleeding: Secondary | ICD-10-CM | POA: Diagnosis not present

## 2016-12-09 DIAGNOSIS — R1084 Generalized abdominal pain: Secondary | ICD-10-CM | POA: Diagnosis present

## 2016-12-09 LAB — COMPREHENSIVE METABOLIC PANEL
ALK PHOS: 47 U/L (ref 38–126)
ALT: 13 U/L — AB (ref 14–54)
AST: 17 U/L (ref 15–41)
Albumin: 3.8 g/dL (ref 3.5–5.0)
Anion gap: 7 (ref 5–15)
BUN: 9 mg/dL (ref 6–20)
CALCIUM: 9.2 mg/dL (ref 8.9–10.3)
CHLORIDE: 105 mmol/L (ref 101–111)
CO2: 26 mmol/L (ref 22–32)
CREATININE: 0.73 mg/dL (ref 0.44–1.00)
Glucose, Bld: 92 mg/dL (ref 65–99)
Potassium: 3.7 mmol/L (ref 3.5–5.1)
SODIUM: 138 mmol/L (ref 135–145)
Total Bilirubin: 0.3 mg/dL (ref 0.3–1.2)
Total Protein: 6.8 g/dL (ref 6.5–8.1)

## 2016-12-09 LAB — CBC
HCT: 36.7 % (ref 36.0–46.0)
Hemoglobin: 12 g/dL (ref 12.0–15.0)
MCH: 30.2 pg (ref 26.0–34.0)
MCHC: 32.7 g/dL (ref 30.0–36.0)
MCV: 92.4 fL (ref 78.0–100.0)
PLATELETS: 311 10*3/uL (ref 150–400)
RBC: 3.97 MIL/uL (ref 3.87–5.11)
RDW: 12.1 % (ref 11.5–15.5)
WBC: 7.4 10*3/uL (ref 4.0–10.5)

## 2016-12-09 LAB — URINALYSIS, ROUTINE W REFLEX MICROSCOPIC
Bilirubin Urine: NEGATIVE
Glucose, UA: NEGATIVE mg/dL
Hgb urine dipstick: NEGATIVE
Ketones, ur: NEGATIVE mg/dL
Leukocytes, UA: NEGATIVE
NITRITE: NEGATIVE
Protein, ur: NEGATIVE mg/dL
SPECIFIC GRAVITY, URINE: 1.009 (ref 1.005–1.030)
pH: 7 (ref 5.0–8.0)

## 2016-12-09 LAB — LIPASE, BLOOD: LIPASE: 30 U/L (ref 11–51)

## 2016-12-09 NOTE — ED Triage Notes (Signed)
Pt states she was seen last week on UC for abd pain, they told her it could be related to her been detox and working out more, pt states abd pain is getting progressively worse even when she stop the detox last week, pt states she is bee having a lot of gas, no n/v/d no fever.

## 2016-12-10 ENCOUNTER — Emergency Department (HOSPITAL_COMMUNITY)
Admission: EM | Admit: 2016-12-10 | Discharge: 2016-12-10 | Disposition: A | Discharge status: Home / Self Care | Payer: Medicaid Other | Attending: Emergency Medicine | Admitting: Emergency Medicine

## 2016-12-10 DIAGNOSIS — R1013 Epigastric pain: Secondary | ICD-10-CM

## 2016-12-10 DIAGNOSIS — K2900 Acute gastritis without bleeding: Secondary | ICD-10-CM

## 2016-12-10 DIAGNOSIS — K219 Gastro-esophageal reflux disease without esophagitis: Secondary | ICD-10-CM

## 2016-12-10 LAB — PREGNANCY, URINE: PREG TEST UR: NEGATIVE

## 2016-12-10 MED ORDER — FAMOTIDINE IN NACL 20-0.9 MG/50ML-% IV SOLN
20.0000 mg | Freq: Once | INTRAVENOUS | Status: DC
  Filled 2016-12-10: qty 50

## 2016-12-10 MED ORDER — SODIUM CHLORIDE 0.9 % IV BOLUS (SEPSIS): 1000.0000 mL | Freq: Once | INTRAVENOUS | Status: DC

## 2016-12-10 MED ORDER — GI COCKTAIL ~~LOC~~
30.0000 mL | Freq: Once | ORAL | Status: AC
  Administered 2016-12-10: 30 mL via ORAL
  Filled 2016-12-10: qty 30

## 2016-12-10 MED ORDER — RANITIDINE HCL 150 MG PO TABS: 150.0000 mg | ORAL_TABLET | Freq: Two times a day (BID) | ORAL | 0 refills | Status: DC

## 2016-12-10 NOTE — Discharge Instructions (Signed)
Your abdominal pain is likely from gastritis or an ulcer. You should start taking your protonix again (or take over the counter omeprazole/prilosec); you will need to take zantac as directed, and avoid spicy/fatty/acidic foods, avoid soda/coffee/tea/alcohol. Avoid laying down flat within 30 minutes of eating. Avoid NSAIDs like ibuprofen/aleve/motrin/etc on an empty stomach. May consider using over the counter tums/maalox as needed for additional relief. Use tylenol as needed for pain. Follow up with your regular doctor in one week for ongoing evaluation of your abdominal pain. Return to the ER for changes or worsening symptoms.  Abdominal (belly) pain can be caused by many things. Your caregiver performed an examination and possibly ordered blood/urine tests and imaging (CT scan, x-rays, ultrasound). Many cases can be observed and treated at home after initial evaluation in the emergency department. Even though you are being discharged home, abdominal pain can be unpredictable. Therefore, you need a repeated exam if your pain does not resolve, returns, or worsens. Most patients with abdominal pain don't have to be admitted to the hospital or have surgery, but serious problems like appendicitis and gallbladder attacks can start out as nonspecific pain. Many abdominal conditions cannot be diagnosed in one visit, so follow-up evaluations are very important. SEEK IMMEDIATE MEDICAL ATTENTION IF YOU DEVELOP ANY OF THE FOLLOWING SYMPTOMS: The pain does not go away or becomes severe.  A temperature above 101 develops.  Repeated vomiting occurs (multiple episodes).  The pain becomes localized to portions of the abdomen. The right side could possibly be appendicitis. In an adult, the left lower portion of the abdomen could be colitis or diverticulitis.  Blood is being passed in stools or vomit (bright red or black tarry stools).  Return also if you develop chest pain, difficulty breathing, dizziness or fainting, or  become confused, poorly responsive, or inconsolable (young children). The constipation stays for more than 4 days.  There is belly (abdominal) or rectal pain.  You do not seem to be getting better.

## 2016-12-10 NOTE — ED Notes (Signed)
Patient stated that she did not want to have the IV and fluids, PA made aware and plans to discharge the patient

## 2016-12-10 NOTE — ED Notes (Signed)
Patient ambulated to the room independently.  No signs of distress noted

## 2016-12-10 NOTE — ED Provider Notes (Signed)
[facility] DEPT Provider Note   CSN: [id number] Arrival date & time: 12/09/16  2145     History   Chief Complaint Chief Complaint  Patient presents with  . Abdominal Pain    HPI Veronica Raymond is a 30 y.o. female with a PMHx of migraines and GERD (no longer taking protonix for the last 6 months), who presents to the ED with complaints of ongoing abdominal pain that began initially as upper abdominal pain and has become more generalized 1.5 weeks. She states that her symptoms started 2 days after she began an exercise regimen and a probiotics and fiber supplement cleanse/body detox. Chart review reveals she was seen at [facility] on 12/01/16, was told her abd pain was likely from constipation and the body cleanse, and was advised to try miralax and increase water for her symptoms. She has increased her water intake, and stopped the probiotic and fiber supplement, however never tried the miralax; she has not noticed any improvement after that UC visit. She describes her pain as 6/10 constant burning and cramping in her entire abdomen, nonradiating, with no known aggravating or alleviating factors given that she has not tried anything specific for her symptoms. She reports associated sensation of bloating. She admits that she drinks alcohol socially, last consumption was 2 weeks ago, none recently. She denies any recent travel, sick contacts, suspicious food intake, NSAID use, or prior abdominal surgeries. LMP 11/27/16. She is sexually active with 2 female partners, both protected with condoms. She admits that she stopped her protonix 6 months ago, despite knowing that she is supposed to still be taking it for her GERD. Last BM was this morning, and was soft and "normal".   She denies fevers, chills, CP, SOB, N/V/D/C, obstipation, melena, hematochezia, hematuria, dysuria, vaginal bleeding/discharge, myalgias, arthralgias, numbness, tingling, focal weakness, or any other complaints at this time.    The  history is provided by the patient and medical records. No language interpreter was used.  Abdominal Pain   This is a new problem. The current episode started more than 1 week ago. The problem occurs constantly. The problem has not changed since onset.The pain is associated with diet changes. The pain is located in the generalized abdominal region. The quality of the pain is cramping and burning. The pain is at a severity of 6/10. The pain is mild. Pertinent negatives include fever, diarrhea, flatus, hematochezia, melena, nausea, vomiting, constipation, dysuria, hematuria, arthralgias and myalgias. Nothing aggravates the symptoms. Nothing relieves the symptoms. Her past medical history is significant for GERD.    Past Medical History:  Diagnosis Date  . Allergic rhinitis   . Bartholin gland cyst 05/04/2013  . GERD (gastroesophageal reflux disease)   . Migraine without aura, without mention of intractable migraine without mention of status migrainosus 07/09/2013  . Vaginal Pap smear, abnormal     Patient Active Problem List   Diagnosis Date Noted  . Active labor 04/06/2014  . SVD (spontaneous vaginal delivery) 04/06/2014  . Migraine without aura, without mention of intractable migraine without mention of status migrainosus 07/09/2013    Past Surgical History:  Procedure Laterality Date  . ADENOIDECTOMY    . ESSURE TUBAL LIGATION  07-05-2014  . TONSILLECTOMY    . TYMPANOSTOMY TUBE PLACEMENT      OB History    Gravida Para Term Preterm AB Living   4 4 4     4    SAB TAB Ectopic Multiple Live Births  4       Home Medications    Prior to Admission medications   Medication Sig Start Date End Date Taking? Authorizing Provider  pantoprazole (PROTONIX) 20 MG tablet Take 20 mg by mouth daily.    Historical Provider, MD    Family History Family History  Problem Relation Age of Onset  . Hypertension Mother   . Heart disease Father   . Alcohol abuse Father   . Mental  retardation Maternal Uncle   . Diabetes Maternal Grandmother   . Hypertension Maternal Grandmother   . Cancer Maternal Grandfather     prostate  . Alcohol abuse Other     Social History Social History  Substance Use Topics  . Smoking status: Never Smoker  . Smokeless tobacco: Never Used  . Alcohol use Yes     Comment: Occasional     Allergies   Iodinated diagnostic agents and Percocet [oxycodone-acetaminophen]   Review of Systems Review of Systems  Constitutional: Negative for chills and fever.  Respiratory: Negative for shortness of breath.   Cardiovascular: Negative for chest pain.  Gastrointestinal: Positive for abdominal pain. Negative for blood in stool, constipation, diarrhea, flatus, hematochezia, melena, nausea and vomiting.       +bloating  Genitourinary: Negative for dysuria, hematuria, vaginal bleeding and vaginal discharge.  Musculoskeletal: Negative for arthralgias and myalgias.  Skin: Negative for color change.  Allergic/Immunologic: Negative for immunocompromised state.  Neurological: Negative for weakness and numbness.  Psychiatric/Behavioral: Negative for confusion.   10 Systems reviewed and are negative for acute change except as noted in the HPI.   Physical Exam Updated Vital Signs BP 108/77 (BP Location: Left Arm)   Pulse 69   Temp 98.2 F (36.8 C) (Oral)   Resp 19   Ht 5\' 4"  (1.626 m)   Wt 73.5 kg   LMP 12/01/2016   SpO2 100%   BMI 27.81 kg/m   Physical Exam  Constitutional: She is oriented to person, place, and time. Vital signs are normal. She appears well-developed and well-nourished.  Non-toxic appearance. No distress.  Afebrile, nontoxic, NAD  HENT:  Head: Normocephalic and atraumatic.  Mouth/Throat: Oropharynx is clear and moist and mucous membranes are normal.  Eyes: Conjunctivae and EOM are normal. Right eye exhibits no discharge. Left eye exhibits no discharge.  Neck: Normal range of motion. Neck supple.  Cardiovascular: Normal  rate, regular rhythm, normal heart sounds and intact distal pulses.  Exam reveals no gallop and no friction rub.   No murmur heard. Pulmonary/Chest: Effort normal and breath sounds normal. No respiratory distress. She has no decreased breath sounds. She has no wheezes. She has no rhonchi. She has no rales.  Abdominal: Soft. Normal appearance and bowel sounds are normal. She exhibits no distension. There is tenderness in the epigastric area. There is no rigidity, no rebound, no guarding, no CVA tenderness, no tenderness at McBurney's point and negative Murphy's sign.  Soft, nondistended, +BS throughout, with mild epigastric TTP, no r/g/r, neg murphy's, neg mcburney's, no CVA TTP   Musculoskeletal: Normal range of motion.  Neurological: She is alert and oriented to person, place, and time. She has normal strength. No sensory deficit.  Skin: Skin is warm, dry and intact. No rash noted.  Psychiatric: She has a normal mood and affect.  Nursing note and vitals reviewed.    ED Treatments / Results  Labs (all labs ordered are listed, but only abnormal results are displayed) Labs Reviewed  COMPREHENSIVE METABOLIC PANEL - Abnormal; Notable for the  following:       Result Value   ALT 13 (*)    All other components within normal limits  URINALYSIS, ROUTINE W REFLEX MICROSCOPIC - Abnormal; Notable for the following:    Color, Urine STRAW (*)    All other components within normal limits  LIPASE, BLOOD  CBC  PREGNANCY, URINE    EKG  EKG Interpretation None       Radiology No results found.  Procedures Procedures (including critical care time)  Medications Ordered in ED Medications  famotidine (PEPCID) IVPB 20 mg premix (not administered)  sodium chloride 0.9 % bolus 1,000 mL (not administered)  gi cocktail (Maalox,Lidocaine,Donnatal) (30 mLs Oral Given 12/10/16 0301)     Initial Impression / Assessment and Plan / ED Course  I have reviewed the triage vital signs and the nursing  notes.  Pertinent labs & imaging results that were available during my care of the patient were reviewed by me and considered in my medical decision making (see chart for details).     31 y.o. female here with abd pain x1.5 wks, started after she did a detox/body cleanse and started on a probiotic x2 days; having bloating as well. Seen at [facility] and told to drink more water. Continued to have pain so she came in tonight. On exam, mild epigastric TTP, nonperitoneal, no other areas of tenderness, neg murphy's. No lower abd tenderness. No vaginal complaints. Doubt need for pelvic exam. Likely gastritis/PUD; U/A unremarkable, lipase WNL, CMP WNL, CBC WNL. Will get Upreg and give pepcid, fluids, and GI cocktail, then reassess shortly. Doubt need for imaging.   3:10 AM Upreg neg. Pt didn't want fluids or pepcid, just wanted GI cocktail; felt better after GI cocktail given. Advised that she should start back on her home protonix, will start zantac, advised diet and lifestyle modifications for gastritis/GERD. Tylenol/tums/maalox PRN additional relief. f/up with PCP in 1wk for recheck of symptoms. I explained the diagnosis and have given explicit precautions to return to the ER including for any other new or worsening symptoms. The patient understands and accepts the medical plan as it's been dictated and I have answered their questions. Discharge instructions concerning home care and prescriptions have been given. The patient is STABLE and is discharged to home in good condition.   Final Clinical Impressions(s) / ED Diagnoses   Final diagnoses:  Epigastric abdominal pain  Chronic GERD  Acute gastritis without hemorrhage, unspecified gastritis type    New Prescriptions New Prescriptions   RANITIDINE (ZANTAC) 150 MG TABLET    Take 1 tablet (150 mg total) by mouth 2 (two) times daily.     [address] PA-C 12/10/16 [place], MD 12/12/16 3536

## 2017-04-19 ENCOUNTER — Encounter (HOSPITAL_COMMUNITY): Payer: Self-pay | Admitting: *Deleted

## 2017-04-19 ENCOUNTER — Inpatient Hospital Stay (HOSPITAL_COMMUNITY)
Admission: AD | Admit: 2017-04-19 | Discharge: 2017-04-19 | Disposition: A | Discharge status: Home / Self Care | Payer: Medicaid Other | Source: Ambulatory Visit | Attending: Obstetrics & Gynecology | Admitting: Obstetrics & Gynecology

## 2017-04-19 DIAGNOSIS — Z3202 Encounter for pregnancy test, result negative: Secondary | ICD-10-CM | POA: Diagnosis not present

## 2017-04-19 DIAGNOSIS — N939 Abnormal uterine and vaginal bleeding, unspecified: Secondary | ICD-10-CM | POA: Insufficient documentation

## 2017-04-19 DIAGNOSIS — K219 Gastro-esophageal reflux disease without esophagitis: Secondary | ICD-10-CM

## 2017-04-19 DIAGNOSIS — F17290 Nicotine dependence, other tobacco product, uncomplicated: Secondary | ICD-10-CM | POA: Diagnosis not present

## 2017-04-19 DIAGNOSIS — N926 Irregular menstruation, unspecified: Secondary | ICD-10-CM | POA: Insufficient documentation

## 2017-04-19 DIAGNOSIS — Z885 Allergy status to narcotic agent status: Secondary | ICD-10-CM | POA: Insufficient documentation

## 2017-04-19 DIAGNOSIS — Z9851 Tubal ligation status: Secondary | ICD-10-CM | POA: Diagnosis not present

## 2017-04-19 DIAGNOSIS — R109 Unspecified abdominal pain: Secondary | ICD-10-CM | POA: Diagnosis present

## 2017-04-19 DIAGNOSIS — K5900 Constipation, unspecified: Secondary | ICD-10-CM

## 2017-04-19 HISTORY — DX: Unspecified ovarian cyst, unspecified side: N83.209

## 2017-04-19 LAB — URINALYSIS, ROUTINE W REFLEX MICROSCOPIC
BILIRUBIN URINE: NEGATIVE
GLUCOSE, UA: NEGATIVE mg/dL
HGB URINE DIPSTICK: NEGATIVE
Ketones, ur: NEGATIVE mg/dL
Nitrite: NEGATIVE
PROTEIN: NEGATIVE mg/dL
Specific Gravity, Urine: 1.018 (ref 1.005–1.030)
pH: 7 (ref 5.0–8.0)

## 2017-04-19 LAB — CBC
HCT: 40.5 % (ref 36.0–46.0)
HEMOGLOBIN: 13.4 g/dL (ref 12.0–15.0)
MCH: 30.9 pg (ref 26.0–34.0)
MCHC: 33.1 g/dL (ref 30.0–36.0)
MCV: 93.3 fL (ref 78.0–100.0)
Platelets: 333 10*3/uL (ref 150–400)
RBC: 4.34 MIL/uL (ref 3.87–5.11)
RDW: 12.5 % (ref 11.5–15.5)
WBC: 6.7 10*3/uL (ref 4.0–10.5)

## 2017-04-19 LAB — WET PREP, GENITAL
Clue Cells Wet Prep HPF POC: NONE SEEN
Sperm: NONE SEEN
Trich, Wet Prep: NONE SEEN
Yeast Wet Prep HPF POC: NONE SEEN

## 2017-04-19 LAB — URINALYSIS, MICROSCOPIC (REFLEX): RBC / HPF: NONE SEEN RBC/hpf (ref 0–5)

## 2017-04-19 LAB — POCT PREGNANCY, URINE: PREG TEST UR: NEGATIVE

## 2017-04-19 NOTE — Discharge Instructions (Signed)

## 2017-04-19 NOTE — MAU Note (Signed)
Last wk was having some cramping in lower abd.  Had a cycle around July 8, started as a gush, then spotting. only lasted 2 days.  Bled again 7/15 lasted for 4 days. Expected her period around the first of the month.  Has the "essure" coils, placed about 2 yrs ago.  Has been having a lot of preg  Symptoms: acne, cramps, wt gain.   googled it wondering about PCOS.  Having chest pain, mid sternum radiates to left nipple, comes and goes, when it comes it lasts about 10 min. Can not recreate pain, not associated with any activity.  Had it this morning around 0830. (8/10 when occurred- sharp stabbing pain)

## 2017-04-19 NOTE — MAU Provider Note (Signed)
History    CSN: [id number]  Arrival date and time: 04/19/17 1436  None    Chief Complaint  Patient presents with  . Abdominal Pain    HPI: Veronica Raymond is a 31 y.o. female [phone] s/p BTL who presents to MAU with c/c of irregular vaginal bleeding and abdominal cramping; also with c/o intermittent chest pain in the last 2 weeks. She report menses was late by about a 1 week, and when this came on 7/8, she had heavy bleeding, this only lasted 2 days, but had vaginal bleeding again starting on 7/15 and lasting 4 days. Currently does not have vaginal bleeding, but reporting continued intermittent cramping. She is sexually active with a partner of 6 months; having unprotected sex.   She also note that for the last 2 weeks she has few episodes of sharp chest pain, lasting about 10 minutes. She denies any other associated symptoms with these episodes, including no SOB, diaphoresis or dizziness, but reports she has felt more fatigued lately. Episodes not associated with exertion. She endorses h/o GERD, and reports that she stopped taking Protonix about 2 weeks ago.   Denies fever, chills, nausea, vomiting, dysuria, hematuria, urinary frequency, or vaginal discharge. Endorses constipation; last BM 3 days ago.   Past obstetric history: OB History  Gravida Para Term Preterm AB Living  5 4 4   1 4   SAB TAB Ectopic Multiple Live Births    1     4    # Outcome Date GA Lbr Len/2nd Weight Sex Delivery Anes PTL Lv  5 Term 04/06/14 [redacted]w[redacted]d 14:20 / 00:13 7 lb 1.4 oz (3.215 kg) F Vag-Spont None  LIV  4 Term 01/20/10 [redacted]w[redacted]d 03:00 6 lb 5 oz (2.863 kg) F Vag-Spont None  LIV  3 Term 08/21/08 [redacted]w[redacted]d 02:00 6 lb 3 oz (2.807 kg) F Vag-Spont None  LIV     Birth Comments: oligo  2 Term 06/03/03 [redacted]w[redacted]d 09:00 6 lb 6 oz (2.892 kg) M Vag-Spont None  LIV  1 TAB               Past Medical History:  Diagnosis Date  . Allergic rhinitis   . Bartholin gland cyst 05/04/2013  . GERD (gastroesophageal reflux disease)   .  Infection    UTI  . Migraine without aura, without mention of intractable migraine without mention of status migrainosus 07/09/2013  . Ovarian cyst 2014  . Vaginal Pap smear, abnormal     Past Surgical History:  Procedure Laterality Date  . ADENOIDECTOMY    . ESSURE TUBAL LIGATION  07-05-2014  . THERAPEUTIC ABORTION     elective  . TONSILLECTOMY    . TYMPANOSTOMY TUBE PLACEMENT      Family History  Problem Relation Age of Onset  . Hypertension Mother   . Heart disease Father        mumur  . Alcohol abuse Father   . Mental retardation Maternal Uncle   . Diabetes Maternal Grandmother   . Hypertension Maternal Grandmother   . Kidney disease Maternal Grandmother   . Heart disease Maternal Grandmother   . Stroke Maternal Grandmother   . Cancer Maternal Grandfather        prostate  . Alcohol abuse Other   . Hypertension Paternal Grandmother     Social History  Substance Use Topics  . Smoking status: Current Some Day Smoker    Packs/day: 0.00    Years: 1.00    Types: Cigars  . Smokeless tobacco:  Never Used  . Alcohol use Yes     Comment: 2/month    Allergies:  Allergies  Allergen Reactions  . Iodinated Diagnostic Agents Hives  . Percocet [Oxycodone-Acetaminophen] Itching    Prescriptions Prior to Admission  Medication Sig Dispense Refill Last Dose  . Multiple Vitamin (MULTIVITAMIN WITH MINERALS) TABS tablet Take 1 tablet by mouth daily.   Past Week at Unknown time  . ranitidine (ZANTAC) 150 MG tablet Take 1 tablet (150 mg total) by mouth 2 (two) times daily. (Patient not taking: Reported on 04/19/2017) 30 tablet 0 Not Taking at Unknown time    Review of Systems - Negative except for what is mentioned in HPI.  Physical Exam   Blood pressure 115/81, pulse 71, temperature 98.4 F (36.9 C), temperature source Oral, resp. rate 16, weight 176 lb (79.8 kg), last menstrual period 04/10/2017, SpO2 100 %, currently breastfeeding.  Constitutional: Well-developed,  well-nourished female in no acute distress.  Cardiovascular: normal rate, regular rythm Respiratory: normal effort, lungs CTAB.  GI: Abd soft, non-distended, non-tender on all 4 quadrants. No rebound or guarding.  GU: Neg CVAT. Pelvic: NEFG, minimal whiteish-gray discharge, no blood, cervix clean. No CMT. No adnexal fullness or tenderness. MSK: Extremities nontender, no edema, normal ROM Neurologic: Alert and oriented x 4. Psych: Normal mood and affect  MAU Course  Procedures  MDM UPT negative Wet prep, GC/Chlamydia collected Screening RPR and HIV ordered. Patient verbally consent to HIV testing CBC ordered to eval for anemia  Lab results reviewed: Results for orders placed or performed during the hospital encounter of 04/19/17  Wet prep, genital  Result Value Ref Range   Yeast Wet Prep HPF POC NONE SEEN NONE SEEN   Trich, Wet Prep NONE SEEN NONE SEEN   Clue Cells Wet Prep HPF POC NONE SEEN NONE SEEN   WBC, Wet Prep HPF POC FEW (A) NONE SEEN   Sperm NONE SEEN   Urinalysis, Routine w reflex microscopic  Result Value Ref Range   Color, Urine YELLOW YELLOW   APPearance HAZY (A) CLEAR   Specific Gravity, Urine 1.018 1.005 - 1.030   pH 7.0 5.0 - 8.0   Glucose, UA NEGATIVE NEGATIVE mg/dL   Hgb urine dipstick NEGATIVE NEGATIVE   Bilirubin Urine NEGATIVE NEGATIVE   Ketones, ur NEGATIVE NEGATIVE mg/dL   Protein, ur NEGATIVE NEGATIVE mg/dL   Nitrite NEGATIVE NEGATIVE   Leukocytes, UA TRACE (A) NEGATIVE  CBC  Result Value Ref Range   WBC 6.7 4.0 - 10.5 K/uL   RBC 4.34 3.87 - 5.11 MIL/uL   Hemoglobin 13.4 12.0 - 15.0 g/dL   HCT 40.5 36.0 - 46.0 %   MCV 93.3 78.0 - 100.0 fL   MCH 30.9 26.0 - 34.0 pg   MCHC 33.1 30.0 - 36.0 g/dL   RDW 12.5 11.5 - 15.5 %   Platelets 333 150 - 400 K/uL  Urinalysis, Microscopic (reflex)  Result Value Ref Range   RBC / HPF NONE SEEN 0 - 5 RBC/hpf   WBC, UA 0-5 0 - 5 WBC/hpf   Bacteria, UA RARE (A) NONE SEEN   Squamous Epithelial / LPF 6-30 (A)  NONE SEEN   Mucous PRESENT   Pregnancy, urine POC  Result Value Ref Range   Preg Test, Ur NEGATIVE NEGATIVE    Assessment and Plan  Abdominal cramping and irregular menses: UPT negative. Abdominal and cervical exam reassuring. Wet prep negative. GC/Chlamydia swab sent. Not currently bleeding.  --Discussed follow up with GYN if recurrent AUB --Dicussed  bowel regimen for constipation --STD screening as above.  Consitpation --Discussed bowel regimen  Intermittent chest pain: atypical, x 2 weeks, last 2 days ago; no current chest pain. Has h/o GERD, off PPI x 2 weeks --Discussed treatment for GERD, and precautions at length    Degele, Jenne Pane, MD 04/19/2017 3:43 PM

## 2017-04-20 LAB — HIV ANTIBODY (ROUTINE TESTING W REFLEX): HIV Screen 4th Generation wRfx: NONREACTIVE

## 2017-04-20 LAB — GC/CHLAMYDIA PROBE AMP (~~LOC~~) NOT AT ARMC
Chlamydia: NEGATIVE
Neisseria Gonorrhea: NEGATIVE

## 2017-04-20 LAB — RPR: RPR Ser Ql: NONREACTIVE

## 2017-05-24 ENCOUNTER — Emergency Department (HOSPITAL_COMMUNITY)
Admission: EM | Admit: 2017-05-24 | Discharge: 2017-05-24 | Disposition: A | Discharge status: Home / Self Care | Payer: Medicaid Other | Attending: Emergency Medicine | Admitting: Emergency Medicine

## 2017-05-24 ENCOUNTER — Encounter (HOSPITAL_COMMUNITY): Payer: Self-pay | Admitting: Emergency Medicine

## 2017-05-24 DIAGNOSIS — F17290 Nicotine dependence, other tobacco product, uncomplicated: Secondary | ICD-10-CM | POA: Insufficient documentation

## 2017-05-24 DIAGNOSIS — L089 Local infection of the skin and subcutaneous tissue, unspecified: Secondary | ICD-10-CM

## 2017-05-24 DIAGNOSIS — R21 Rash and other nonspecific skin eruption: Secondary | ICD-10-CM | POA: Diagnosis present

## 2017-05-24 MED ORDER — DOXYCYCLINE HYCLATE 100 MG PO CAPS: 100.0000 mg | ORAL_CAPSULE | Freq: Two times a day (BID) | ORAL | 0 refills | Status: DC

## 2017-05-24 NOTE — Discharge Instructions (Signed)
Return if any problems.

## 2017-05-24 NOTE — ED Triage Notes (Signed)
Pt c/o scalp itching throughout scalp and burning sensation at hairline onset Saturday. Pt had her hair bleached and trimmed last Wednesday. No hair loss. No lesions noted to scalp.

## 2017-05-24 NOTE — ED Provider Notes (Signed)
[facility] DEPT Provider Note   CSN: [id number] Arrival date & time: 05/24/17  0810     History   Chief Complaint Chief Complaint  Patient presents with  . Pruritis    HPI Veronica Raymond is a 31 y.o. female.  The history is provided by the patient. No language interpreter was used.  Rash   This is a new problem. The current episode started more than 1 week ago. The problem has been gradually worsening. The problem is associated with nothing. There has been no fever. The rash is present on the scalp. The pain is moderate. The pain has been constant since onset. Associated symptoms include itching. She has tried antibiotic cream for the symptoms. The treatment provided no relief.  Pt reports she bleached and colored her hair.  Pt reports sores on her scalp, now seem infected and pt has swollen lymph nodes.  Pt started old rx for keflex  Past Medical History:  Diagnosis Date  . Allergic rhinitis   . Bartholin gland cyst 05/04/2013  . GERD (gastroesophageal reflux disease)   . Infection    UTI  . Migraine without aura, without mention of intractable migraine without mention of status migrainosus 07/09/2013  . Ovarian cyst 2014  . Vaginal Pap smear, abnormal     Patient Active Problem List   Diagnosis Date Noted  . Active labor 04/06/2014  . SVD (spontaneous vaginal delivery) 04/06/2014  . Migraine without aura, without mention of intractable migraine without mention of status migrainosus 07/09/2013    Past Surgical History:  Procedure Laterality Date  . ADENOIDECTOMY    . ESSURE TUBAL LIGATION  07-05-2014  . THERAPEUTIC ABORTION     elective  . TONSILLECTOMY    . TYMPANOSTOMY TUBE PLACEMENT      OB History    Gravida Para Term Preterm AB Living   5 4 4   1 4    SAB TAB Ectopic Multiple Live Births     1     4       Home Medications    Prior to Admission medications   Medication Sig Start Date End Date Taking? Authorizing Provider  doxycycline  (VIBRAMYCIN) 100 MG capsule Take 1 capsule (100 mg total) by mouth 2 (two) times daily. 05/24/17   Fransico Meadow, PA-C  Multiple Vitamin (MULTIVITAMIN WITH MINERALS) TABS tablet Take 1 tablet by mouth daily.    [provider]  ranitidine (ZANTAC) 150 MG tablet Take 1 tablet (150 mg total) by mouth 2 (two) times daily. Patient not taking: Reported on 04/19/2017 12/10/16   Street, [place], PA-C    Family History Family History  Problem Relation Age of Onset  . Hypertension Mother   . Heart disease Father        mumur  . Alcohol abuse Father   . Mental retardation Maternal Uncle   . Diabetes Maternal Grandmother   . Hypertension Maternal Grandmother   . Kidney disease Maternal Grandmother   . Heart disease Maternal Grandmother   . Stroke Maternal Grandmother   . Cancer Maternal Grandfather        prostate  . Alcohol abuse Other   . Hypertension Paternal Grandmother     Social History Social History  Substance Use Topics  . Smoking status: Current Some Day Smoker    Packs/day: 0.00    Years: 1.00    Types: Cigars  . Smokeless tobacco: Never Used  . Alcohol use Yes     Comment: 2/month  Allergies   Iodinated diagnostic agents and Percocet [oxycodone-acetaminophen]   Review of Systems Review of Systems  Skin: Positive for itching and rash.  All other systems reviewed and are negative.    Physical Exam Updated Vital Signs BP 111/62 (BP Location: Left Arm)   Pulse 81   Temp 98.4 F (36.9 C) (Oral)   Resp 16   SpO2 100%   Physical Exam  Constitutional: She appears well-developed and well-nourished. No distress.  HENT:  Head: Normocephalic and atraumatic.  Right Ear: External ear normal.  Left Ear: External ear normal.  Nose: Nose normal.  Mouth/Throat: Oropharynx is clear and moist.  Eyes: Conjunctivae are normal.  Neck: Neck supple.  Cardiovascular: Normal rate and regular rhythm.   No murmur heard. Pulmonary/Chest: Effort normal and breath  sounds normal. No respiratory distress.  Abdominal: Soft. There is no tenderness.  Musculoskeletal: She exhibits no edema.  Lymphadenopathy:    She has cervical adenopathy.  Neurological: She is alert.  Skin: Skin is warm and dry.  Multiple scabbed areas scalp  Psychiatric: She has a normal mood and affect.  Nursing note and vitals reviewed.    ED Treatments / Results  Labs (all labs ordered are listed, but only abnormal results are displayed) Labs Reviewed - No data to display  EKG  EKG Interpretation None       Radiology No results found.  Procedures Procedures (including critical care time)  Medications Ordered in ED Medications - No data to display   Initial Impression / Assessment and Plan / ED Course  I have reviewed the triage vital signs and the nursing notes.  Pertinent labs & imaging results that were available during my care of the patient were reviewed by me and considered in my medical decision making (see chart for details).       Final Clinical Impressions(s) / ED Diagnoses   Final diagnoses:  Skin infection    New Prescriptions Discharge Medication List as of 05/24/2017 10:07 AM    START taking these medications   Details  doxycycline (VIBRAMYCIN) 100 MG capsule Take 1 capsule (100 mg total) by mouth 2 (two) times daily., Starting Tue 05/24/2017, Print      An After Visit Summary was printed and given to the patient.   Fransico Meadow, [place] 05/24/17 1101    Virgel Manifold, MD 05/24/17 [phone]

## 2017-07-23 ENCOUNTER — Encounter (HOSPITAL_COMMUNITY): Payer: Self-pay | Admitting: Emergency Medicine

## 2017-07-23 ENCOUNTER — Emergency Department (HOSPITAL_COMMUNITY)
Admission: EM | Admit: 2017-07-23 | Discharge: 2017-07-23 | Disposition: A | Discharge status: Home / Self Care | Payer: Medicaid Other | Attending: Emergency Medicine | Admitting: Emergency Medicine

## 2017-07-23 DIAGNOSIS — Z79899 Other long term (current) drug therapy: Secondary | ICD-10-CM | POA: Diagnosis not present

## 2017-07-23 DIAGNOSIS — F17210 Nicotine dependence, cigarettes, uncomplicated: Secondary | ICD-10-CM | POA: Diagnosis not present

## 2017-07-23 DIAGNOSIS — J029 Acute pharyngitis, unspecified: Secondary | ICD-10-CM | POA: Insufficient documentation

## 2017-07-23 MED ORDER — CETIRIZINE HCL 10 MG PO TABS: 10.0000 mg | ORAL_TABLET | Freq: Every day | ORAL | 0 refills | Status: DC

## 2017-07-23 NOTE — ED Triage Notes (Signed)
Pt c/o sore throat x 3 weeks, no fever, no n/v.

## 2017-07-23 NOTE — ED Notes (Signed)
Pt one touch pt, see Provider's assessment.   

## 2017-07-23 NOTE — ED Provider Notes (Signed)
[facility] EMERGENCY DEPARTMENT Provider Note   CSN: [id number] Arrival date & time: 07/23/17  2031     History   Chief Complaint Chief Complaint  Patient presents with  . Sore Throat    HPI Veronica Raymond is a 31 y.o. female who presents today with chief complaint acute onset, constant sore throat for 3 weeks. She states that sore throat is worse in the mornings and at night and that it is painful to swallow. She states that she is able to eat and drink without difficulty and denies throat tightness. She states that 2 weeks ago she developed nasal congestion and sinus pressure which has been improving. She also is experiencing bilateral ear fullness and intermittent milddull frontal headaches. She denies fevers, chills, CP, SOB, abdominal pain, nausea, or vomiting.she has tried ibuprofen and Tylenol as well as warm drinks which have not been significantly helpful. She previously took Zyrtec for allergies but states she has not taken this medication in quite some time.  The history is provided by the patient.    Past Medical History:  Diagnosis Date  . Allergic rhinitis   . Bartholin gland cyst 05/04/2013  . GERD (gastroesophageal reflux disease)   . Infection    UTI  . Migraine without aura, without mention of intractable migraine without mention of status migrainosus 07/09/2013  . Ovarian cyst 2014  . Vaginal Pap smear, abnormal     Patient Active Problem List   Diagnosis Date Noted  . Active labor 04/06/2014  . SVD (spontaneous vaginal delivery) 04/06/2014  . Migraine without aura, without mention of intractable migraine without mention of status migrainosus 07/09/2013    Past Surgical History:  Procedure Laterality Date  . ADENOIDECTOMY    . ESSURE TUBAL LIGATION  07-05-2014  . THERAPEUTIC ABORTION     elective  . TONSILLECTOMY    . TYMPANOSTOMY TUBE PLACEMENT      OB History    Gravida Para Term Preterm AB Living   5 4 4   1 4    SAB TAB  Ectopic Multiple Live Births     1     4       Home Medications    Prior to Admission medications   Medication Sig Start Date End Date Taking? Authorizing Provider  cetirizine (ZYRTEC ALLERGY) 10 MG tablet Take 1 tablet (10 mg total) by mouth daily. 07/23/17 08/22/17  Rodell Perna A, PA-C  doxycycline (VIBRAMYCIN) 100 MG capsule Take 1 capsule (100 mg total) by mouth 2 (two) times daily. 05/24/17   Fransico Meadow, PA-C  Multiple Vitamin (MULTIVITAMIN WITH MINERALS) TABS tablet Take 1 tablet by mouth daily.    [provider]  ranitidine (ZANTAC) 150 MG tablet Take 1 tablet (150 mg total) by mouth 2 (two) times daily. Patient not taking: Reported on 04/19/2017 12/10/16   Street, [place], PA-C    Family History Family History  Problem Relation Age of Onset  . Hypertension Mother   . Heart disease Father        mumur  . Alcohol abuse Father   . Mental retardation Maternal Uncle   . Diabetes Maternal Grandmother   . Hypertension Maternal Grandmother   . Kidney disease Maternal Grandmother   . Heart disease Maternal Grandmother   . Stroke Maternal Grandmother   . Cancer Maternal Grandfather        prostate  . Alcohol abuse Other   . Hypertension Paternal Grandmother     Social History Social  History  Substance Use Topics  . Smoking status: Current Some Day Smoker    Packs/day: 0.00    Years: 1.00    Types: Cigars  . Smokeless tobacco: Never Used  . Alcohol use Yes     Comment: 2/month     Allergies   Iodinated diagnostic agents and Percocet [oxycodone-acetaminophen]   Review of Systems Review of Systems  Constitutional: Negative for chills and fever.  HENT: Positive for congestion, sinus pressure, sore throat and trouble swallowing. Negative for ear discharge, ear pain, facial swelling and voice change.   Eyes: Negative for photophobia and visual disturbance.  Respiratory: Negative for shortness of breath.   Cardiovascular: Negative for chest pain.    Gastrointestinal: Negative for abdominal pain, nausea and vomiting.     Physical Exam Updated Vital Signs BP 105/71 (BP Location: Right Arm)   Pulse 82   Temp 98.2 F (36.8 C) (Oral)   Resp 16   Ht 5\' 6"  (1.676 m)   Wt 72.6 kg (160 lb)   LMP 07/22/2017   SpO2 100%   BMI 25.82 kg/m   Physical Exam  Constitutional: She appears well-developed and well-nourished. No distress.  HENT:  Head: Normocephalic and atraumatic.  Right Ear: External ear normal.  Left Ear: External ear normal.  Mouth/Throat: Oropharynx is clear and moist.  No frontal or maxillary sinus TTP. Bilateral TMs with middle ear effusion.No erythema or bulging. Nasal septum is midline with pale pink boggy mucosa and nasal congestion bilaterally. Posterior oropharynx without erythema or uvular deviation. Tonsils are surgically absent.no exudates noted. Postnasal drip is noted posteriorly. No trismus or sublingual abnormalities. No facial swelling noted.  Eyes: Pupils are equal, round, and reactive to light. Conjunctivae and EOM are normal. Right eye exhibits no discharge. Left eye exhibits no discharge.  Neck: Normal range of motion. Neck supple. No JVD present. No tracheal deviation present.  Cardiovascular: Normal rate, regular rhythm and normal heart sounds.   Pulmonary/Chest: Effort normal and breath sounds normal. No respiratory distress. She has no wheezes. She has no rales. She exhibits no tenderness.  Abdominal: Soft. Bowel sounds are normal. She exhibits no distension. There is no tenderness.  Musculoskeletal: She exhibits no edema.  Neurological: She is alert.  Skin: Skin is warm and dry. No erythema.  Psychiatric: She has a normal mood and affect. Her behavior is normal.  Nursing note and vitals reviewed.    ED Treatments / Results  Labs (all labs ordered are listed, but only abnormal results are displayed) Labs Reviewed - No data to display  EKG  EKG Interpretation None       Radiology No  results found.  Procedures Procedures (including critical care time)  Medications Ordered in ED Medications - No data to display   Initial Impression / Assessment and Plan / ED Course  I have reviewed the triage vital signs and the nursing notes.  Pertinent labs & imaging results that were available during my care of the patient were reviewed by me and considered in my medical decision making (see chart for details).     Patient presents with sore throat for 3 weeks. Afebrile, vital signs are stable.she has no tonsillar exudate, erythema, hypertrophy, cervical lymphadenopathy. No concern for PTA or soft tissue spread of infection. No trismus or uvula deviation. No facial swelling and airway is patent. History and physical examination consistent with seasonal allergies. Will discharge with Zyrtec and discussed symptomatic care. She will follow-up with primary care physician if symptoms persist.discussed indications  for return to the ED. Pt verbalized understanding of and agreement with plan and is safe for discharge home at this time.   Final Clinical Impressions(s) / ED Diagnoses   Final diagnoses:  Sore throat    New Prescriptions New Prescriptions   CETIRIZINE (ZYRTEC ALLERGY) 10 MG TABLET    Take 1 tablet (10 mg total) by mouth daily.     Renita Papa, PA-C 07/23/17 2249    Pattricia Boss, MD 07/23/17 [phone]

## 2017-07-23 NOTE — Discharge Instructions (Signed)
Take Zyrtec daily. Alternate 600 mg of ibuprofen and [phone] mg of Tylenol every 3 hours as needed for pain. Do not exceed 4000 mg of Tylenol daily. You may also use aspirin instead for pain control. Use warm water salt gargles, warm drinks, over-the-counter throat lozenges or sprays for your sore throat. Follow up with a primary care physician for reevaluation of your symptoms if they persist. Return to the ED if any concerning signs or symptoms developed just fever, throat tightness, drooling, or difficulty breathing.

## 2017-07-23 NOTE — ED Notes (Signed)
Called for Pt x 3 times, Patient not present in the waiting area.

## 2017-11-25 ENCOUNTER — Other Ambulatory Visit: Payer: Self-pay

## 2017-11-25 ENCOUNTER — Ambulatory Visit (HOSPITAL_COMMUNITY)
Admission: EM | Admit: 2017-11-25 | Discharge: 2017-11-25 | Disposition: A | Discharge status: Home / Self Care | Payer: No Typology Code available for payment source | Attending: Internal Medicine | Admitting: Internal Medicine

## 2017-11-25 ENCOUNTER — Encounter (HOSPITAL_COMMUNITY): Payer: Self-pay | Admitting: Emergency Medicine

## 2017-11-25 DIAGNOSIS — Z833 Family history of diabetes mellitus: Secondary | ICD-10-CM | POA: Insufficient documentation

## 2017-11-25 DIAGNOSIS — Z841 Family history of disorders of kidney and ureter: Secondary | ICD-10-CM | POA: Diagnosis not present

## 2017-11-25 DIAGNOSIS — K219 Gastro-esophageal reflux disease without esophagitis: Secondary | ICD-10-CM | POA: Diagnosis not present

## 2017-11-25 DIAGNOSIS — Z8042 Family history of malignant neoplasm of prostate: Secondary | ICD-10-CM | POA: Diagnosis not present

## 2017-11-25 DIAGNOSIS — Z79899 Other long term (current) drug therapy: Secondary | ICD-10-CM | POA: Insufficient documentation

## 2017-11-25 DIAGNOSIS — Z8249 Family history of ischemic heart disease and other diseases of the circulatory system: Secondary | ICD-10-CM | POA: Insufficient documentation

## 2017-11-25 DIAGNOSIS — Z885 Allergy status to narcotic agent status: Secondary | ICD-10-CM | POA: Insufficient documentation

## 2017-11-25 DIAGNOSIS — G43009 Migraine without aura, not intractable, without status migrainosus: Secondary | ICD-10-CM | POA: Diagnosis not present

## 2017-11-25 DIAGNOSIS — F17290 Nicotine dependence, other tobacco product, uncomplicated: Secondary | ICD-10-CM | POA: Insufficient documentation

## 2017-11-25 DIAGNOSIS — Z823 Family history of stroke: Secondary | ICD-10-CM | POA: Insufficient documentation

## 2017-11-25 DIAGNOSIS — N898 Other specified noninflammatory disorders of vagina: Secondary | ICD-10-CM | POA: Diagnosis not present

## 2017-11-25 DIAGNOSIS — R109 Unspecified abdominal pain: Secondary | ICD-10-CM | POA: Insufficient documentation

## 2017-11-25 DIAGNOSIS — Z818 Family history of other mental and behavioral disorders: Secondary | ICD-10-CM | POA: Diagnosis not present

## 2017-11-25 DIAGNOSIS — Z811 Family history of alcohol abuse and dependence: Secondary | ICD-10-CM | POA: Insufficient documentation

## 2017-11-25 DIAGNOSIS — Z9889 Other specified postprocedural states: Secondary | ICD-10-CM | POA: Insufficient documentation

## 2017-11-25 DIAGNOSIS — Z91041 Radiographic dye allergy status: Secondary | ICD-10-CM | POA: Insufficient documentation

## 2017-11-25 DIAGNOSIS — T192XXA Foreign body in vulva and vagina, initial encounter: Secondary | ICD-10-CM | POA: Diagnosis present

## 2017-11-25 NOTE — Discharge Instructions (Signed)
No foreign body seen on exam.  Did notice some vaginal discharge, this can be normal during your cycle. Cytology sent, you will be contacted with any positive results that requires further treatment. Refrain from sexual activity for the next 7 days. Monitor for any worsening of symptoms, fever, abdominal pain, nausea, vomiting, to follow up for reevaluation.

## 2017-11-25 NOTE — ED Triage Notes (Signed)
Pt states "on Tuesday (feb 26th), I fell asleep and I thought I put a tampon in, I didn't see one when I woke up the next morning. Im not sure if there is one in there." Pt c/o mild discomfort, thinks its her period cramps. Pt started her period Sunday feb 24th. States her period was less heavy than normal.

## 2017-11-25 NOTE — ED Provider Notes (Signed)
[place]    CSN: [id number] Arrival date & time: 11/25/17  1646     History   Chief Complaint Chief Complaint  Patient presents with  . Foreign Body in Vagina    HPI Veronica Raymond is a 32 y.o. female.   32 year old female comes in for possible foreign body in the vagina.  States that 3 days ago, she woke up and could not find her tampon.  States she fell asleep the night before, and cannot remember if she had applied a tampon.  States mild abdominal cramping, that is normal for her during her cycle.  Otherwise no nausea, vomiting, fevers.  No discharge, irritation.  Sexually active, one partner, no condom use.      Past Medical History:  Diagnosis Date  . Allergic rhinitis   . Bartholin gland cyst 05/04/2013  . GERD (gastroesophageal reflux disease)   . Infection    UTI  . Migraine without aura, without mention of intractable migraine without mention of status migrainosus 07/09/2013  . Ovarian cyst 2014  . Vaginal Pap smear, abnormal     Patient Active Problem List   Diagnosis Date Noted  . Active labor 04/06/2014  . SVD (spontaneous vaginal delivery) 04/06/2014  . Migraine without aura, without mention of intractable migraine without mention of status migrainosus 07/09/2013    Past Surgical History:  Procedure Laterality Date  . ADENOIDECTOMY    . ESSURE TUBAL LIGATION  07-05-2014  . THERAPEUTIC ABORTION     elective  . TONSILLECTOMY    . TYMPANOSTOMY TUBE PLACEMENT      OB History    Gravida Para Term Preterm AB Living   5 4 4   1 4    SAB TAB Ectopic Multiple Live Births     1     4       Home Medications    Prior to Admission medications   Medication Sig Start Date End Date Taking? Authorizing Provider  cetirizine (ZYRTEC ALLERGY) 10 MG tablet Take 1 tablet (10 mg total) by mouth daily. Patient not taking: Reported on 11/25/2017 07/23/17 08/22/17  Rodell Perna A, PA-C  doxycycline (VIBRAMYCIN) 100 MG capsule Take 1 capsule (100 mg  total) by mouth 2 (two) times daily. Patient not taking: Reported on 11/25/2017 05/24/17   Fransico Meadow, PA-C  Multiple Vitamin (MULTIVITAMIN WITH MINERALS) TABS tablet Take 1 tablet by mouth daily.    [provider]  ranitidine (ZANTAC) 150 MG tablet Take 1 tablet (150 mg total) by mouth 2 (two) times daily. Patient not taking: Reported on 04/19/2017 12/10/16   Street, [place], PA-C    Family History Family History  Problem Relation Age of Onset  . Hypertension Mother   . Heart disease Father        mumur  . Alcohol abuse Father   . Mental retardation Maternal Uncle   . Diabetes Maternal Grandmother   . Hypertension Maternal Grandmother   . Kidney disease Maternal Grandmother   . Heart disease Maternal Grandmother   . Stroke Maternal Grandmother   . Cancer Maternal Grandfather        prostate  . Alcohol abuse Other   . Hypertension Paternal Grandmother     Social History Social History   Tobacco Use  . Smoking status: Current Some Day Smoker    Packs/day: 0.00    Years: 1.00    Pack years: 0.00    Types: Cigars  . Smokeless tobacco: Never Used  Substance Use Topics  .  Alcohol use: Yes    Comment: 2/month  . Drug use: No     Allergies   Iodinated diagnostic agents and Percocet [oxycodone-acetaminophen]   Review of Systems Review of Systems  Reason unable to perform ROS: See HPI as above.     Physical Exam Triage Vital Signs ED Triage Vitals  Enc Vitals Group     BP 11/25/17 1701 138/79     Pulse Rate 11/25/17 1701 72     Resp 11/25/17 1701 18     Temp 11/25/17 1701 98.1 F (36.7 C)     Temp src --      SpO2 11/25/17 1701 100 %     Weight --      Height --      Head Circumference --      Peak Flow --      Pain Score 11/25/17 1703 1     Pain Loc --      Pain Edu? --      Excl. in [place]? --    No data found.  Updated Vital Signs BP 138/79   Pulse 72   Temp 98.1 F (36.7 C)   Resp 18   LMP 11/20/2017   SpO2 100%   Physical Exam    Constitutional: She is oriented to person, place, and time. She appears well-developed and well-nourished. No distress.  HENT:  Head: Normocephalic and atraumatic.  Eyes: Conjunctivae are normal. Pupils are equal, round, and reactive to light.  Cardiovascular: Normal rate, regular rhythm and normal heart sounds. Exam reveals no gallop and no friction rub.  No murmur heard. Pulmonary/Chest: Effort normal and breath sounds normal. She has no wheezes. She has no rales.  Abdominal: Soft. Bowel sounds are normal. She exhibits no mass. There is no tenderness. There is no rebound, no guarding and no CVA tenderness.  Genitourinary: There is no rash or tenderness on the right labia. There is no rash or tenderness on the left labia. Cervix exhibits no discharge. No foreign body in the vagina. Vaginal discharge found.  Genitourinary Comments: No foreign body seen.   Neurological: She is alert and oriented to person, place, and time.  Skin: Skin is warm and dry.  Psychiatric: She has a normal mood and affect. Her behavior is normal. Judgment normal.    UC Treatments / Results  Labs (all labs ordered are listed, but only abnormal results are displayed) Labs Reviewed  CERVICOVAGINAL ANCILLARY ONLY    EKG  EKG Interpretation None       Radiology No results found.  Procedures Procedures (including critical care time)  Medications Ordered in UC Medications - No data to display   Initial Impression / Assessment and Plan / UC Course  I have reviewed the triage vital signs and the nursing notes.  Pertinent labs & imaging results that were available during my care of the patient were reviewed by me and considered in my medical decision making (see chart for details).    No foreign body seen.  Cytology obtained given patient with discharge on exam.  Patient without tenderness to palpation of the abdomen. Bimanual exam deferred. Cytology sent, patient will be contacted with any positive  results that require additional treatment. Patient to refrain from sexual activity for the next 7 days. Return precautions given.    Final Clinical Impressions(s) / UC Diagnoses   Final diagnoses:  Vaginal discharge    ED Discharge Orders    None        Tasia Catchings, Ndea Kilroy  V, PA-C 11/25/17 1757

## 2017-11-28 LAB — CERVICOVAGINAL ANCILLARY ONLY
Bacterial vaginitis: NEGATIVE
CHLAMYDIA, DNA PROBE: NEGATIVE
Candida vaginitis: NEGATIVE
NEISSERIA GONORRHEA: NEGATIVE
Trichomonas: NEGATIVE

## 2018-01-27 ENCOUNTER — Encounter: Payer: Self-pay | Admitting: Family Medicine

## 2018-01-27 ENCOUNTER — Ambulatory Visit (INDEPENDENT_AMBULATORY_CARE_PROVIDER_SITE_OTHER): Payer: No Typology Code available for payment source | Admitting: Family Medicine

## 2018-01-27 ENCOUNTER — Other Ambulatory Visit (HOSPITAL_COMMUNITY)
Admission: RE | Admit: 2018-01-27 | Discharge: 2018-01-27 | Disposition: A | Discharge status: Home / Self Care | Payer: No Typology Code available for payment source | Source: Ambulatory Visit | Attending: Family Medicine | Admitting: Family Medicine

## 2018-01-27 VITALS — BP 110/60 | HR 84 | Temp 98.7°F | Ht 65.0 in | Wt 182.4 lb

## 2018-01-27 DIAGNOSIS — Z87898 Personal history of other specified conditions: Secondary | ICD-10-CM | POA: Diagnosis not present

## 2018-01-27 DIAGNOSIS — Z0000 Encounter for general adult medical examination without abnormal findings: Secondary | ICD-10-CM

## 2018-01-27 DIAGNOSIS — Z0001 Encounter for general adult medical examination with abnormal findings: Secondary | ICD-10-CM

## 2018-01-27 DIAGNOSIS — Z124 Encounter for screening for malignant neoplasm of cervix: Secondary | ICD-10-CM | POA: Insufficient documentation

## 2018-01-27 DIAGNOSIS — N819 Female genital prolapse, unspecified: Secondary | ICD-10-CM

## 2018-01-27 DIAGNOSIS — Z13220 Encounter for screening for lipoid disorders: Secondary | ICD-10-CM

## 2018-01-27 DIAGNOSIS — J301 Allergic rhinitis due to pollen: Secondary | ICD-10-CM | POA: Diagnosis not present

## 2018-01-27 DIAGNOSIS — Z8742 Personal history of other diseases of the female genital tract: Secondary | ICD-10-CM

## 2018-01-27 DIAGNOSIS — K219 Gastro-esophageal reflux disease without esophagitis: Secondary | ICD-10-CM | POA: Diagnosis not present

## 2018-01-27 DIAGNOSIS — F17290 Nicotine dependence, other tobacco product, uncomplicated: Secondary | ICD-10-CM

## 2018-01-27 DIAGNOSIS — R1013 Epigastric pain: Secondary | ICD-10-CM

## 2018-01-27 LAB — COMPREHENSIVE METABOLIC PANEL
AG Ratio: 1.5 (calc) (ref 1.0–2.5)
ALT: 9 U/L (ref 6–29)
AST: 13 U/L (ref 10–30)
Albumin: 4.4 g/dL (ref 3.6–5.1)
Alkaline phosphatase (APISO): 47 U/L (ref 33–115)
BUN: 15 mg/dL (ref 7–25)
CO2: 28 mmol/L (ref 20–32)
Calcium: 9.7 mg/dL (ref 8.6–10.2)
Chloride: 102 mmol/L (ref 98–110)
Creat: 0.87 mg/dL (ref 0.50–1.10)
Globulin: 3 g/dL (calc) (ref 1.9–3.7)
Glucose, Bld: 97 mg/dL (ref 65–99)
Potassium: 4 mmol/L (ref 3.5–5.3)
Sodium: 138 mmol/L (ref 135–146)
Total Bilirubin: 0.7 mg/dL (ref 0.2–1.2)
Total Protein: 7.4 g/dL (ref 6.1–8.1)

## 2018-01-27 LAB — LIPID PANEL
Cholesterol: 180 mg/dL (ref <200)
HDL: 53 mg/dL (ref >50)
LDL Cholesterol (Calc): 108 mg/dL (calc) — ABNORMAL HIGH
Non-HDL Cholesterol (Calc): 127 mg/dL (calc) (ref <130)
Total CHOL/HDL Ratio: 3.4 (calc) (ref <5.0)
Triglycerides: 95 mg/dL (ref <150)

## 2018-01-27 NOTE — Patient Instructions (Signed)
It was nice to meet you!  I will be putting in a referral for GI and GYN.

## 2018-01-27 NOTE — Progress Notes (Addendum)
Subjective:    Veronica Raymond is a 32 y.o. female and is here for a comprehensive physical exam.  OB History    Gravida  5   Para  4   Term  4   Preterm      AB  1   Living  4     SAB      TAB  1   Ectopic      Multiple      Live Births  4         She does have concerns regarding a feeling that she is having vaginal prolapse. This is worse when she is exercising.  She has not had this evaluated.  She has had 4 vaginal deliveries.  No incontinence.  Intermittent vaginal discharge that has been negative for infections.  No bowel changes.  Ongoing left upper quadrant pain that is intermittent but worsening.  She states that she did go to a needle GI doctor at one point and was told that it was a muscle spasm.  She did not have the insurance to pay for an EGD at that time.  She does take Zantac intermittently.  There are no preventive care reminders to display for this patient. PMHx, SurgHx, SocialHx, Medications, and Allergies were reviewed in the Visit Navigator and updated as appropriate.   Current Outpatient Medications:  Marland Kitchen  Multiple Vitamin (MULTIVITAMIN) capsule, Take 1 capsule by mouth daily., Disp: , Rfl:   Past Medical History:  Diagnosis Date  . Allergic rhinitis   . Bartholin gland cyst 05/04/2013  . GERD (gastroesophageal reflux disease)   . Migraine without aura, without mention of intractable migraine without mention of status migrainosus 07/09/2013  . Ovarian cyst 2014  . Vaginal Pap smear, abnormal    Past Surgical History:  Procedure Laterality Date  . ADENOIDECTOMY    . ESSURE TUBAL LIGATION  07-05-2014  . THERAPEUTIC ABORTION    . TONSILLECTOMY    . TYMPANOSTOMY TUBE PLACEMENT     Social History   Tobacco Use  . Smoking status: Current Some Day Smoker    Packs/day: 0.00    Years: 1.00    Pack years: 0.00    Types: Cigars  . Smokeless tobacco: Never Used  Substance Use Topics  . Alcohol use: Yes    Comment: 2/month  . Drug use:  No    Review of Systems:   Pertinent items are noted in the HPI. Otherwise, ROS is negative.  Objective:   BP 110/60   Pulse 84   Temp 98.7 F (37.1 C) (Oral)   Ht 5\' 5"  (1.651 m)   Wt 182 lb 6.4 oz (82.7 kg)   SpO2 95%   BMI 30.35 kg/m    Wt Readings from Last 3 Encounters:  01/27/18 182 lb 6.4 oz (82.7 kg)  07/23/17 160 lb (72.6 kg)  04/19/17 176 lb (79.8 kg)     Ht Readings from Last 3 Encounters:  01/27/18 5\' 5"  (1.651 m)  07/23/17 5\' 6"  (1.676 m)  12/09/16 5\' 4"  (1.626 m)   General appearance: alert, cooperative and appears stated age. Head: normocephalic, without obvious abnormality, atraumatic. Neck: no adenopathy, supple, symmetrical, trachea midline; thyroid not enlarged, symmetric, no tenderness/mass/nodules. Lungs: clear to auscultation bilaterally. Heart: regular rate and rhythm Abdomen: soft, non-tender; no masses,  no organomegaly. Extremities: extremities normal, atraumatic, no cyanosis or edema. Skin: skin color, texture, turgor normal, no rashes or lesions. Lymph: cervical, supraclavicular, and axillary nodes normal; no abnormal  inguinal nodes palpated. Neurologic: grossly normal.  Pelvic:  External genitalia: no lesions.              Urethra: normal appearing urethra with no masses, tenderness or lesions.              Bartholins and Skenes: normal.               Vagina: normal appearing vagina with normal color and discharge, no lesions.              Cervix: normal              Pap and high risk HPV testing done: Yes.          Bimanual Exam:   Uterus: uterus is normal size, shape, consistency and nontender.                                      Adnexa: normal adnexa in size, nontender and no masses.                                      Assessment/Plan:   [name] was seen today for establish care.  Diagnoses and all orders for this visit:  Routine physical examination  Seasonal allergic rhinitis due to pollen  Gastroesophageal reflux disease  without esophagitis  History of abnormal cervical Pap smear Comments: Followed by GYN.  Will request records.  Last Pap was 2017.  Pap completed today.  Pap smear for cervical cancer screening -     Cytology - PAP  Female genital prolapse, unspecified type  Epigastric pain -     CBC with Differential/Platelet -     Comprehensive metabolic panel -     Ambulatory referral to Gastroenterology  Cigar smoker  Screening for lipid disorders -     Lipid panel   Patient Counseling:   [x]     Nutrition: Stressed importance of moderation in sodium/caffeine intake, saturated fat and cholesterol, caloric balance, sufficient intake of fresh fruits, vegetables, fiber, calcium, iron, and 1 mg of folate supplement per day (for females capable of pregnancy).   [x]      Stressed the importance of regular exercise.    [x]     Substance Abuse: Discussed cessation/primary prevention of tobacco, alcohol, or other drug use; driving or other dangerous activities under the influence; availability of treatment for abuse.    [x]      Injury prevention: Discussed safety belts, safety helmets, smoke detector, smoking near bedding or upholstery.    [x]      Sexuality: Discussed sexually transmitted diseases, partner selection, use of condoms, avoidance of unintended pregnancy  and contraceptive alternatives.    [x]     Dental health: Discussed importance of regular tooth brushing, flossing, and dental visits.   [x]      Health maintenance and immunizations reviewed. Please refer to Health maintenance section.   Veronica Deutscher, DO [place]

## 2018-01-28 ENCOUNTER — Encounter: Payer: Self-pay | Admitting: Family Medicine

## 2018-01-28 DIAGNOSIS — F17290 Nicotine dependence, other tobacco product, uncomplicated: Secondary | ICD-10-CM | POA: Insufficient documentation

## 2018-01-28 DIAGNOSIS — J309 Allergic rhinitis, unspecified: Secondary | ICD-10-CM | POA: Insufficient documentation

## 2018-01-28 DIAGNOSIS — K219 Gastro-esophageal reflux disease without esophagitis: Secondary | ICD-10-CM | POA: Insufficient documentation

## 2018-01-28 DIAGNOSIS — Z8742 Personal history of other diseases of the female genital tract: Secondary | ICD-10-CM | POA: Insufficient documentation

## 2018-01-28 LAB — CBC WITH DIFFERENTIAL/PLATELET
Basophils Absolute: 33 cells/uL (ref 0–200)
Basophils Relative: 0.5 %
Eosinophils Absolute: 59 cells/uL (ref 15–500)
Eosinophils Relative: 0.9 %
HCT: 37.1 % (ref 35.0–45.0)
Hemoglobin: 12.6 g/dL (ref 11.7–15.5)
Lymphs Abs: 3234 cells/uL (ref 850–3900)
MCH: 30 pg (ref 27.0–33.0)
MCHC: 34 g/dL (ref 32.0–36.0)
MCV: 88.3 fL (ref 80.0–100.0)
MPV: 9 fL (ref 7.5–12.5)
Monocytes Relative: 8.1 %
Neutro Abs: 2739 cells/uL (ref 1500–7800)
Neutrophils Relative %: 41.5 %
Platelets: 375 10*3/uL (ref 140–400)
RBC: 4.2 10*6/uL (ref 3.80–5.10)
RDW: 11.9 % (ref 11.0–15.0)
Total Lymphocyte: 49 %
WBC mixed population: 535 cells/uL (ref 200–950)
WBC: 6.6 10*3/uL (ref 3.8–10.8)

## 2018-01-31 ENCOUNTER — Encounter: Payer: Self-pay | Admitting: Nurse Practitioner

## 2018-01-31 LAB — CYTOLOGY - PAP
Adequacy: ABSENT
Bacterial vaginitis: NEGATIVE
Candida vaginitis: NEGATIVE
Diagnosis: NEGATIVE
HPV: NOT DETECTED

## 2018-02-06 ENCOUNTER — Telehealth: Payer: Self-pay | Admitting: Surgical

## 2018-02-06 DIAGNOSIS — M549 Dorsalgia, unspecified: Secondary | ICD-10-CM

## 2018-02-06 NOTE — Telephone Encounter (Signed)
Will need reason but okay to do.

## 2018-02-06 NOTE — Telephone Encounter (Signed)
What would you like to send patient to Dr. Paulla Fore.

## 2018-02-06 NOTE — Telephone Encounter (Signed)
Copied from [place] [id number]. Topic: Referral - Request >> Jan 31, 2018  2:08 PM Carolyn Stare wrote:  Pt call to say because of her insurance she has to have a referral before she can see Dr Paulla Fore

## 2018-02-08 ENCOUNTER — Telehealth: Payer: Self-pay | Admitting: Family Medicine

## 2018-02-08 NOTE — Telephone Encounter (Signed)
See note

## 2018-02-08 NOTE — Telephone Encounter (Signed)
She said that she feels like she has a pinched nerve in her back along with shoulder pain and neck pain

## 2018-02-08 NOTE — Telephone Encounter (Signed)
Left message to return call to our office.  CRM Started ok to give message.   

## 2018-02-08 NOTE — Telephone Encounter (Signed)
See note.   Copied from [place] [phone]. Topic: Referral - Request >> Feb 08, 2018 11:50 AM Ahmed Prima L wrote: Reason for CRM: Patient said she needs a referral [facility] at [facility]. Please advise.

## 2018-02-08 NOTE — Telephone Encounter (Signed)
Left message on patients phone that GYN does not need a referral. She would just need to call the insurance company to let them know where she is going. If she has any more questions just give [name] a call.

## 2018-02-08 NOTE — Telephone Encounter (Signed)
Patient returning [place] call statin gthat Dr. Juleen China wanted her to get the referral to OB/gyn and she is unable to give additional information because she is at work. Please leave a voicemail if no answer.

## 2018-02-08 NOTE — Telephone Encounter (Signed)
Pt has a gyn dr hollander she will make sure he is still  in her insurance network and any problems she will callback

## 2018-02-08 NOTE — Addendum Note (Signed)
Addended by: Durwin Glaze on: 02/08/2018 02:28 PM   Modules accepted: Orders

## 2018-02-08 NOTE — Telephone Encounter (Signed)
Left message for patient to return call to give reason for visit with Dr. Juleen China.

## 2018-02-10 ENCOUNTER — Ambulatory Visit: Payer: No Typology Code available for payment source | Admitting: Nurse Practitioner

## 2018-02-10 ENCOUNTER — Ambulatory Visit: Payer: No Typology Code available for payment source | Admitting: Sports Medicine

## 2018-02-10 ENCOUNTER — Encounter: Payer: Self-pay | Admitting: Nurse Practitioner

## 2018-02-10 VITALS — BP 108/62 | HR 84 | Ht 65.0 in | Wt 182.6 lb

## 2018-02-10 DIAGNOSIS — R1012 Left upper quadrant pain: Secondary | ICD-10-CM | POA: Diagnosis not present

## 2018-02-10 DIAGNOSIS — K219 Gastro-esophageal reflux disease without esophagitis: Secondary | ICD-10-CM | POA: Diagnosis not present

## 2018-02-10 MED ORDER — LIDOCAINE 5 % EX PTCH: 1.0000 | MEDICATED_PATCH | CUTANEOUS | 0 refills | Status: DC

## 2018-02-10 NOTE — Progress Notes (Signed)
Chief Complaint:  LUQ pain  Referring Provider:   Briscoe Deutscher, MD     ASSESSMENT AND PLAN;   58. 32 year old female with 5-year history of pain in LUQ (behind rib cage) exacerbated by twisting and lifting though pain can also occur when sitting.  No associated GI symptoms such as nausea, vomiting, weight loss, bowel changes.  Her pain is not related to eating or defecation. CBC, serum chemistries normal. Saw [facility] at GI years ago, told muscle spasm.  -Her pain is most c/w musculoskeletal pain.  She has tried NSAIDs without relief.   -Trial of Lidoderm patches, if insurance will not pay then I will try her on Voltaren gel.  If this helps then I do suspect this is abdominal wall/chest wall pain.  She may be a candidate for trigger point injection in the future. - If develops concurrent GI symptoms then we will continue with work-up. -Patient will call us in a couple weeks with an update  2. GERD, sx mainly controlled with diet. She was tried on prilosec at one time but didn't feel it helped.  -anti-reflux measures discussed. She really isn't having much reflux now.   HPI:    32 year female with chronic LUQ pain which started about 5 years ago. Initially pain was occurring every two weeks, now every day. Episodes occurs 5 or so times a day, doesn't interrupt sleep. The duration of pain is approximately 5-10 minutes and most often occurs after lifting something like groceries or her daughter. Also occurs after twisting. Eating nor BMs affect the pain. No nausea, vomiting or weight loss associated with the pain. No bowel habit changes. No blood in stool.   PCP referred her to [facility] a few years ago. Told pain might be a muscle spasm and given a pill to put under tongue.but patient wasn't convinced and didn't try it. She has tried tylenol and advil without improvement.    Patient gives a hx of GERD. Stopped PPI two years ago as it no longer helped. Taking Tums now, helps a little. Mainly  controls GERD sx through diet.   Past Medical History:  Diagnosis Date  . Allergic rhinitis   . Bartholin gland cyst 05/04/2013  . GERD (gastroesophageal reflux disease)   . Migraine without aura, without mention of intractable migraine without mention of status migrainosus 07/09/2013  . Ovarian cyst 2014  . Vaginal Pap smear, abnormal     Past Surgical History:  Procedure Laterality Date  . ADENOIDECTOMY    . ESSURE TUBAL LIGATION  07-05-2014  . THERAPEUTIC ABORTION    . TONSILLECTOMY    . TYMPANOSTOMY TUBE PLACEMENT     Family History  Problem Relation Age of Onset  . Hypertension Mother   . Heart disease Father   . Alcohol abuse Father   . Mental retardation Maternal Uncle   . Diabetes Maternal Grandmother   . Hypertension Maternal Grandmother   . Kidney disease Maternal Grandmother   . Heart disease Maternal Grandmother   . Stroke Maternal Grandmother   . Prostate cancer Maternal Grandfather   . Alcohol abuse Other   . Hypertension Paternal Grandmother    Social History   Tobacco Use  . Smoking status: Current Some Day Smoker    Packs/day: 0.00    Years: 1.00    Pack years: 0.00    Types: Cigars  . Smokeless tobacco: Never Used  Substance Use Topics  . Alcohol use: Yes    Comment: 2/month  .  Drug use: No   Current Outpatient Medications  Medication Sig Dispense Refill  . Multiple Vitamin (MULTIVITAMIN) capsule Take 1 capsule by mouth daily.     No current facility-administered medications for this visit.    Allergies  Allergen Reactions  . Iodinated Diagnostic Agents Hives  . Percocet [Oxycodone-Acetaminophen] Itching    Review of Systems: As for allergy, sinus trouble, back pain, fatigue, headaches, muscle pain and cramps, urine leakage.  All other systems reviewed and negative except where noted in HPI.    Physical Exam:    Wt Readings from Last 3 Encounters:  02/10/18 182 lb 9.6 oz (82.8 kg)  01/27/18 182 lb 6.4 oz (82.7 kg)  07/23/17 160  lb (72.6 kg)    BP 108/62   Pulse 84   Ht 5\' 5"  (1.651 m)   Wt 182 lb 9.6 oz (82.8 kg)   BMI 30.39 kg/m  Constitutional:  Pleasant well-developed, female in no acute distress. Psychiatric: Normal mood and affect. Behavior is normal. EENT: Pupils normal.  Conjunctivae are normal. No scleral icterus. Neck supple.  Cardiovascular: Normal rate, regular rhythm. No edema Pulmonary/chest: Effort normal and breath sounds normal. No wheezing, rales or rhonchi. Abdominal: Soft, nondistended. Nontender. Bowel sounds active throughout. There are no masses palpable. No hepatomegaly. Neurological: Alert and oriented to person place and time. Skin: Skin is warm and dry. No rashes noted.  Tye Savoy, NP  02/10/2018, 2:57 PM  Cc: Briscoe Deutscher, DO

## 2018-02-10 NOTE — Patient Instructions (Signed)
If you are age 31 or older, your body mass index should be between 23-30. Your Body mass index is 30.39 kg/m. If this is out of the aforementioned range listed, please consider follow up with your Primary Care Provider.  If you are age 77 or younger, your body mass index should be between 19-25. Your Body mass index is 30.39 kg/m. If this is out of the aformentioned range listed, please consider follow up with your Primary Care Provider.   We have sent the following medications to your pharmacy for you to pick up at your convenience: Lidoderm patch  If too expensive, Call nurse, Eustaquio Maize.  Will call in Voltaren.  Thank you for choosing me and [facility].   Tye Savoy, NP

## 2018-02-13 NOTE — Progress Notes (Signed)
Thank you for sending this case to me. I have reviewed the entire note, and the outlined plan seems appropriate.  It does sound musculoskeletal. I found an abdominal ultrasound from 2014 done for this pain as well.  Wilfrid Lund, MD

## 2018-02-17 ENCOUNTER — Ambulatory Visit: Payer: No Typology Code available for payment source | Admitting: Sports Medicine

## 2018-02-23 MED FILL — LIDOCAINE PATCH 5%: 5 | 30 days supply | Qty: 30 | Fill #0

## 2018-03-02 ENCOUNTER — Ambulatory Visit: Payer: No Typology Code available for payment source | Admitting: Sports Medicine

## 2018-03-02 ENCOUNTER — Encounter: Payer: Self-pay | Admitting: Sports Medicine

## 2018-03-02 VITALS — BP 110/76 | HR 83 | Ht 65.0 in | Wt 186.4 lb

## 2018-03-02 DIAGNOSIS — M549 Dorsalgia, unspecified: Secondary | ICD-10-CM | POA: Diagnosis not present

## 2018-03-02 DIAGNOSIS — M9903 Segmental and somatic dysfunction of lumbar region: Secondary | ICD-10-CM | POA: Diagnosis not present

## 2018-03-02 DIAGNOSIS — M9902 Segmental and somatic dysfunction of thoracic region: Secondary | ICD-10-CM | POA: Diagnosis not present

## 2018-03-02 DIAGNOSIS — M9905 Segmental and somatic dysfunction of pelvic region: Secondary | ICD-10-CM | POA: Diagnosis not present

## 2018-03-02 NOTE — Patient Instructions (Addendum)
Also check out [employer]" which is a program developed by Dr. Minerva Ends.   There are links to a couple of his YouTube Videos below and I would like to see performing one of his videos 5-6 days per week.    A good intro video is: "Independence from Pain 7-minute Video" - [email or web address]   His more advanced video is: "Powerful Posture and Pain Relief: 12 minutes of Foundation Training" - https://youtu.be/[email or web address]  Do not try to attempt this entire video when first beginning.    Try breaking of each exercise that he goes into shorter segments.  Otherwise if they perform an exercise for 45 seconds, start with 15 seconds and rest and then resume when they begin the new activity.    If you work your way up to doing this 12 minute video, I expect you will see significant improvements in your pain.  If you enjoy his videos and would like to find out more you can look on his website: [email or web address]  He has a workout streaming option as well as a DVD set available for purchase.  Amazon has the best price for his DVDs.     Please perform the exercise program that we have prepared for you and gone over in detail on a daily basis.  In addition to the handout you were provided you can access your program through: [email or web address]   Your unique program code is:  [email or web address]

## 2018-03-02 NOTE — Progress Notes (Signed)
PROCEDURE NOTE : OSTEOPATHIC MANIPULATION The decision today to treat with Osteopathic Manipulative Therapy (OMT) was based on physical exam findings. Verbal consent was obtained following a discussion with the patient regarding the of risks, benefits and potential side effects, including an acute pain flare,post manipulation soreness and need for repeat treatments.     Contraindications to OMT reviewed and include: NONE  Manipulation was performed as below: Regions treated: Thoracic spine, Lumbar spine and Pelvis OMT Techniques Used: HVLA, muscle energy and myofascial release  The patient tolerated the treatment well and reported Improved symptoms following treatment today. Patient was given medications, exercises, stretches and lifestyle modifications per AVS and verbally.   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

## 2018-03-02 NOTE — Progress Notes (Signed)
PROCEDURE NOTE: THERAPEUTIC EXERCISES (97110) 15 minutes spent for Therapeutic exercises as below and as referenced in the AVS.  This included exercises focusing on stretching, strengthening, with significant focus on eccentric aspects.   Proper technique shown and discussed handout in great detail with ATC.  All questions were discussed and answered.   Long term goals include an improvement in range of motion, strength, endurance as well as avoiding reinjury. Frequency of visits is one time as determined during today's  office visit. Frequency of exercises to be performed is as per handout.  EXERCISES REVIEWED:  Pelvic recruitment  [facility] Exercises

## 2018-03-02 NOTE — Progress Notes (Signed)
Veronica Raymond. Masayoshi Couzens, [place] [facility] [place]  Veronica Raymond - 32 y.o. female MRN [id number]  Date of birth: 14-Apr-1986  Visit Date: 03/02/2018  PCP: Briscoe Deutscher, DO   Referred by: Briscoe Deutscher, DO  Scribe for today's visit: Wendy Poet, LAT, ATC     SUBJECTIVE:  Veronica Raymond is here for New Patient (Initial Visit) (back pain) .  Referred by: Dr.Wallace Her back pain symptoms INITIALLY: Began about 6 months w/ no specific MOI.  She reports both back and neck pain Described as moderate , radiating to B scapula and into her lower back Worsened with prolonged sitting; lifting her daughter Improved with nothing noted in terms of long-lasting relief Additional associated symptoms include: N/T in B feet    At this time symptoms are worsening compared to onset w/ increased pain. She has been using ice and heat and also tried massage but all provided only temporary relief.   ROS Denies night time disturbances. Denies fevers, chills, or night sweats. Denies unexplained weight loss. Denies personal history of cancer. Reports changes in bowel or bladder habits.  Increased urinary frequency.  Has seen OB. Denies recent unreported falls. Denies new or worsening dyspnea or wheezing. Reports headaches or dizziness.  Hx of migraines Reports numbness, tingling or weakness  In the extremities - in B feet Denies dizziness or presyncopal episodes Denies lower extremity edema    HISTORY & PERTINENT PRIOR DATA:  Prior History reviewed and updated per electronic medical record.  Significant/pertinent history, findings, studies include:  reports that she has never smoked. She has never used smokeless tobacco. No results for input(s): HGBA1C, LABURIC, CREATINE in the last 8760 hours. No specialty comments available. No problems updated.  OBJECTIVE:  VS:  HT:5\' 5"  (165.1 cm)   WT:186 lb 6.4 oz (84.6 kg)  BMI:31.02    BP:110/76   HR:83bpm  TEMP: ( )  RESP:98 %   PHYSICAL EXAM: Constitutional: WDWN, Non-toxic appearing. Psychiatric: Alert & appropriately interactive.  Not depressed or anxious appearing. Respiratory: No increased work of breathing.  Trachea Midline Eyes: Pupils are equal.  EOM intact without nystagmus.  No scleral icterus  Vascular Exam: warm to touch no edema  lower extremity neuro exam: unremarkable normal strength normal sensation normal reflexes  MSK Exam: Back & Lower Extremities:  Bilateral negative straight leg raise.  No significant midline tenderness.    Good internal and external rotation of the hips.  Patient is able to heel and toe walk without significant difficulty.  Manual muscle testing is 5+/5 in BLE myotomes without focality  Lower extremity DTRs 2+/4 diffusely and symmetric   ASSESSMENT & PLAN:   1. Back pain, unspecified back location, unspecified back pain laterality, unspecified chronicity   2. Somatic dysfunction of thoracic region   3. Somatic dysfunction of lumbar region   4. Somatic dysfunction of pelvis region     PLAN: Osteopathic manipulation was performed today based on physical exam findings.  Please see procedure note for further information including Osteopathic Exam findings  Discussed the foundation of treatment for this condition is physical therapy and/or daily (5-6 days/week) therapeutic exercises, focusing on core strengthening, coordination, neuromuscular control/reeducation.  Therapeutic exercises prescribed per procedure note.  Links to [employer] provided today per Patient Instructions.  These exercises were developed by Minerva Ends, DC with a strong emphasis on core neuromuscular reducation and postural realignment through body-weight exercises.  Follow-up: Return in about 1 month (  around 03/30/2018).      Please see additional documentation for Objective, Assessment and Plan sections. Pertinent additional  documentation may be included in corresponding procedure notes, imaging studies, problem based documentation and patient instructions. Please see these sections of the encounter for additional information regarding this visit.  CMA/ATC served as Education administrator during this visit. History, Physical, and Plan performed by medical provider. Documentation and orders reviewed and attested to.      Gerda Diss, [facility] Physician

## 2018-03-13 ENCOUNTER — Encounter: Payer: Self-pay | Admitting: Sports Medicine

## 2018-03-15 ENCOUNTER — Telehealth: Payer: Self-pay | Admitting: Family Medicine

## 2018-03-15 ENCOUNTER — Other Ambulatory Visit: Payer: Self-pay

## 2018-03-15 DIAGNOSIS — N8189 Other female genital prolapse: Secondary | ICD-10-CM

## 2018-03-15 NOTE — Telephone Encounter (Signed)
Absolutely.

## 2018-03-15 NOTE — Telephone Encounter (Signed)
Called Patient she states that GYN wanted her to PT at [facility]. For pelvic floor training. Ok to get order in?

## 2018-03-15 NOTE — Telephone Encounter (Signed)
Referral sent 

## 2018-03-15 NOTE — Telephone Encounter (Signed)
Copied from [place] [id number]. Topic: Referral - Request >> Mar 15, 2018  9:14 AM Judyann Munson wrote: CRM for notification.   Patient  called and stated she has a appt tomorrow at  9:30am and  she needs a referral  (Pt) for OBGYN at [facility] so she doesn't have to do  the possible surgery. Please advise

## 2018-03-21 ENCOUNTER — Encounter: Payer: Self-pay | Admitting: Sports Medicine

## 2018-04-07 ENCOUNTER — Ambulatory Visit: Payer: No Typology Code available for payment source | Admitting: Sports Medicine

## 2018-04-10 ENCOUNTER — Encounter: Payer: Self-pay | Admitting: Sports Medicine

## 2018-04-13 ENCOUNTER — Ambulatory Visit (INDEPENDENT_AMBULATORY_CARE_PROVIDER_SITE_OTHER): Payer: No Typology Code available for payment source | Admitting: Family Medicine

## 2018-04-13 ENCOUNTER — Ambulatory Visit (INDEPENDENT_AMBULATORY_CARE_PROVIDER_SITE_OTHER): Payer: No Typology Code available for payment source

## 2018-04-13 ENCOUNTER — Encounter: Payer: Self-pay | Admitting: Family Medicine

## 2018-04-13 VITALS — BP 114/72 | HR 79 | Temp 98.8°F | Ht 65.0 in | Wt 185.0 lb

## 2018-04-13 DIAGNOSIS — R1084 Generalized abdominal pain: Secondary | ICD-10-CM | POA: Diagnosis not present

## 2018-04-13 DIAGNOSIS — E860 Dehydration: Secondary | ICD-10-CM

## 2018-04-13 DIAGNOSIS — K5900 Constipation, unspecified: Secondary | ICD-10-CM

## 2018-04-13 DIAGNOSIS — R110 Nausea: Secondary | ICD-10-CM

## 2018-04-13 LAB — POCT URINALYSIS DIPSTICK
Bilirubin, UA: NEGATIVE
Blood, UA: NEGATIVE
Glucose, UA: NEGATIVE
KETONES UA: NEGATIVE
Leukocytes, UA: NEGATIVE
Nitrite, UA: NEGATIVE
PH UA: 6 (ref 5.0–8.0)
Protein, UA: NEGATIVE
UROBILINOGEN UA: 0.2 U/dL

## 2018-04-13 LAB — POCT URINE PREGNANCY: PREG TEST UR: NEGATIVE

## 2018-04-13 MED ORDER — DOCUSATE SODIUM 100 MG PO CAPS: 100.0000 mg | ORAL_CAPSULE | Freq: Two times a day (BID) | ORAL | 0 refills | Status: DC

## 2018-04-13 MED ORDER — POLYETHYLENE GLYCOL 3350 17 GM/SCOOP PO POWD: 17.0000 g | Freq: Two times a day (BID) | ORAL | 1 refills | Status: DC | PRN

## 2018-04-13 NOTE — Progress Notes (Signed)
   Subjective:  Veronica Raymond is a 32 y.o. female who presents today for same-day appointment with a chief complaint of abdominal pain.   HPI:  Abdominal pain, Acute problem Symptoms started about 2 weeks ago.  Initially had severe cramping that progressed to nausea, vomiting, constipation, and diarrhea.  Symptoms have resolved except for the nausea and abdominal pain that have worsened over the last few days.  She has tried taking Pepto-Bismol and ibuprofen which has helped a little bit.  No obvious precipitating events.  No fevers or chills.  Last menstrual cycle was a couple weeks ago.  She has Essure in place.   ROS: Per HPI  PMH: She reports that she has never smoked. She has never used smokeless tobacco. She reports that she drinks alcohol. She reports that she does not use drugs.  Objective:  Physical Exam: BP 114/72 (BP Location: Right Arm, Patient Position: Sitting, Cuff Size: Large)   Pulse 79   Temp 98.8 F (37.1 C) (Oral)   Ht 5\' 5"  (1.651 m)   Wt 185 lb (83.9 kg)   SpO2 100%   BMI 30.79 kg/m   Gen: NAD, resting comfortably CV: RRR with no murmurs appreciated Pulm: NWOB, CTAB with no crackles, wheezes, or rhonchi GI: Normal bowel sounds present. Soft, Nontender, Nondistended.  Results for orders placed or performed in visit on 04/13/18 (from the past 24 hour(s))  POCT urinalysis dipstick     Status: Abnormal   Collection Time: 04/13/18  2:48 PM  Result Value Ref Range   Color, UA Yellow    Clarity, UA Clear    Glucose, UA Negative Negative   Bilirubin, UA Negative    Ketones, UA Negative    Spec Grav, UA >=1.030 (A) 1.010 - 1.025   Blood, UA Negative    pH, UA 6.0 5.0 - 8.0   Protein, UA Negative Negative   Urobilinogen, UA 0.2 0.2 or 1.0 E.U./dL   Nitrite, UA Negative    Leukocytes, UA Negative Negative   Appearance     Odor    POCT urine pregnancy     Status: None   Collection Time: 04/13/18  2:50 PM  Result Value Ref Range   Preg Test, Ur Negative  Negative    Assessment/Plan:  Abdominal pain/constipation/nausea Patient with moderate amount of constipation based on my read of her KUB.  Will await radiology read.  This is the most likely explanation for her symptoms.  Her abdominal exam is otherwise benign she has no red flag signs or symptoms.  Discussed treatment options.  She will start on a bowel regimen of MiraLAX and Colace.  She will also increase her water intake to at least 1 to 2 L daily.  Discussed goal of 1-2 soft bowel movements daily.  Discussed reasons to return to care.  Follow-up as needed.  Dehydration UA with elevated spec grav.  Likely contributing to above.  Algis Greenhouse. Jerline Pain, MD 04/13/2018 3:15 PM

## 2018-04-13 NOTE — Patient Instructions (Signed)
It was very nice to see you today!  Please start the miralax and colace. Our goal is 1-2 bowel movements per day.  You can adjust the dose of medications as needed. Please increase your water intake to 1 to 2 liters daily.  Please let me know or let Dr. Juleen China know if your symptoms worsen or do not improve over the next couple weeks.  Take care, Dr Jerline Pain

## 2018-04-14 NOTE — Progress Notes (Signed)
Dr Marigene Ehlers interpretation of your lab work:  The radiologist did not see any other abnormalities on your xray. Please continue with the bowel regimen we discussed and let me or Dr Juleen China know if your symptoms worsen or do not improve over the next couple of weeks.    If you have any additional questions, please give [name] a call or send [name] a message through [place].  Take care, Dr Jerline Pain

## 2018-04-28 ENCOUNTER — Telehealth: Payer: Self-pay | Admitting: Family Medicine

## 2018-04-28 NOTE — Telephone Encounter (Signed)
Copied from [place] [id number]. Topic: General - Other >> Apr 28, 2018  8:01 AM Yvette Rack wrote: Reason for CRM: pt calling wanting a letter written out to her job to let them know what's going on with her she states that she keep going back and fourth to the bathroom

## 2018-04-28 NOTE — Telephone Encounter (Signed)
Letter has been written and patient can access it through [place].

## 2018-04-28 NOTE — Telephone Encounter (Signed)
Was seen by Jerline Pain for this.

## 2018-05-12 ENCOUNTER — Other Ambulatory Visit: Payer: Self-pay | Admitting: Surgical

## 2018-05-12 MED ORDER — CHOLECALCIFEROL 1.25 MG (50000 UT) PO TABS: ORAL_TABLET | ORAL | 0 refills | Status: DC

## 2018-05-12 MED FILL — VIT D3-50 50,000 UNITS CAPS: 1.25 MG | 84 days supply | Qty: 12 | Fill #0

## 2018-06-13 ENCOUNTER — Other Ambulatory Visit: Payer: Self-pay | Admitting: Internal Medicine

## 2018-06-13 MED ORDER — VALACYCLOVIR HCL 1 G PO TABS: 1000.0000 mg | ORAL_TABLET | Freq: Two times a day (BID) | ORAL | 3 refills | Status: DC

## 2018-06-13 MED FILL — valACYclovir HCL 1 GM TABS: 1 | 7 days supply | Qty: 14 | Fill #0

## 2018-09-05 ENCOUNTER — Ambulatory Visit (INDEPENDENT_AMBULATORY_CARE_PROVIDER_SITE_OTHER): Payer: No Typology Code available for payment source | Admitting: Family Medicine

## 2018-09-05 ENCOUNTER — Encounter (HOSPITAL_COMMUNITY): Payer: Self-pay | Admitting: Emergency Medicine

## 2018-09-05 ENCOUNTER — Encounter: Payer: Self-pay | Admitting: Family Medicine

## 2018-09-05 ENCOUNTER — Emergency Department (HOSPITAL_COMMUNITY)
Admission: EM | Admit: 2018-09-05 | Discharge: 2018-09-05 | Disposition: A | Discharge status: Home / Self Care | Payer: No Typology Code available for payment source | Attending: Emergency Medicine | Admitting: Emergency Medicine

## 2018-09-05 VITALS — BP 118/76 | HR 89 | Temp 98.6°F | Ht 65.0 in | Wt 185.0 lb

## 2018-09-05 DIAGNOSIS — Z79899 Other long term (current) drug therapy: Secondary | ICD-10-CM | POA: Diagnosis not present

## 2018-09-05 DIAGNOSIS — R002 Palpitations: Secondary | ICD-10-CM

## 2018-09-05 DIAGNOSIS — T50905A Adverse effect of unspecified drugs, medicaments and biological substances, initial encounter: Secondary | ICD-10-CM | POA: Insufficient documentation

## 2018-09-05 DIAGNOSIS — Z789 Other specified health status: Secondary | ICD-10-CM | POA: Diagnosis not present

## 2018-09-05 DIAGNOSIS — R05 Cough: Secondary | ICD-10-CM | POA: Diagnosis present

## 2018-09-05 DIAGNOSIS — R197 Diarrhea, unspecified: Secondary | ICD-10-CM

## 2018-09-05 DIAGNOSIS — J069 Acute upper respiratory infection, unspecified: Secondary | ICD-10-CM

## 2018-09-05 DIAGNOSIS — B349 Viral infection, unspecified: Secondary | ICD-10-CM | POA: Insufficient documentation

## 2018-09-05 DIAGNOSIS — Y658 Other specified misadventures during surgical and medical care: Secondary | ICD-10-CM | POA: Insufficient documentation

## 2018-09-05 DIAGNOSIS — T887XXA Unspecified adverse effect of drug or medicament, initial encounter: Secondary | ICD-10-CM | POA: Diagnosis not present

## 2018-09-05 LAB — URINALYSIS, ROUTINE W REFLEX MICROSCOPIC
Bacteria, UA: NONE SEEN
Bilirubin Urine: NEGATIVE
Glucose, UA: NEGATIVE mg/dL
Ketones, ur: NEGATIVE mg/dL
LEUKOCYTES UA: NEGATIVE
Nitrite: NEGATIVE
PH: 6 (ref 5.0–8.0)
Protein, ur: NEGATIVE mg/dL
Specific Gravity, Urine: 1.004 — ABNORMAL LOW (ref 1.005–1.030)

## 2018-09-05 LAB — COMPREHENSIVE METABOLIC PANEL
ALT: 12 U/L (ref 0–44)
AST: 16 U/L (ref 15–41)
Albumin: 3.9 g/dL (ref 3.5–5.0)
Alkaline Phosphatase: 45 U/L (ref 38–126)
Anion gap: 9 (ref 5–15)
BUN: 14 mg/dL (ref 6–20)
CO2: 24 mmol/L (ref 22–32)
CREATININE: 0.9 mg/dL (ref 0.44–1.00)
Calcium: 9.4 mg/dL (ref 8.9–10.3)
Chloride: 105 mmol/L (ref 98–111)
GFR calc Af Amer: >60 mL/min (ref >60)
GFR calc non Af Amer: >60 mL/min (ref >60)
Glucose, Bld: 120 mg/dL — ABNORMAL HIGH (ref 70–99)
Potassium: 4.3 mmol/L (ref 3.5–5.1)
Sodium: 138 mmol/L (ref 135–145)
Total Bilirubin: 0.5 mg/dL (ref 0.3–1.2)
Total Protein: 7.4 g/dL (ref 6.5–8.1)

## 2018-09-05 LAB — RAPID URINE DRUG SCREEN, HOSP PERFORMED
Amphetamines: NOT DETECTED
Barbiturates: NOT DETECTED
Benzodiazepines: NOT DETECTED
Cocaine: NOT DETECTED
Opiates: NOT DETECTED
Tetrahydrocannabinol: NOT DETECTED

## 2018-09-05 LAB — CBC
HCT: 38.1 % (ref 36.0–46.0)
Hemoglobin: 12.1 g/dL (ref 12.0–15.0)
MCH: 29.4 pg (ref 26.0–34.0)
MCHC: 31.8 g/dL (ref 30.0–36.0)
MCV: 92.7 fL (ref 80.0–100.0)
NRBC: 0 % (ref 0.0–0.2)
Platelets: 317 10*3/uL (ref 150–400)
RBC: 4.11 MIL/uL (ref 3.87–5.11)
RDW: 12 % (ref 11.5–15.5)
WBC: 6 10*3/uL (ref 4.0–10.5)

## 2018-09-05 LAB — LIPASE, BLOOD: Lipase: 49 U/L (ref 11–51)

## 2018-09-05 LAB — I-STAT BETA HCG BLOOD, ED (MC, WL, AP ONLY): I-stat hCG, quantitative: <5 m[IU]/mL (ref <5)

## 2018-09-05 MED ORDER — ONDANSETRON 4 MG PO TBDP: 4.0000 mg | ORAL_TABLET | Freq: Three times a day (TID) | ORAL | 0 refills | Status: DC | PRN

## 2018-09-05 MED ORDER — LOPERAMIDE HCL 2 MG PO TABS: 2.0000 mg | ORAL_TABLET | Freq: Four times a day (QID) | ORAL | 0 refills | Status: DC | PRN

## 2018-09-05 MED ORDER — ONDANSETRON 4 MG PO TBDP: 4.0000 mg | ORAL_TABLET | Freq: Once | ORAL | Status: DC

## 2018-09-05 NOTE — ED Notes (Signed)
Pt left without being medicated or other treatments being completed. MD aware, pt left with significant other

## 2018-09-05 NOTE — ED Provider Notes (Signed)
TIME SEEN: 5:32 AM  CHIEF COMPLAINT: Multiple complaints  HPI: Patient is a 32 year old female who presents to the emergency department with multiple complaints.  She states several days ago she started feeling sick where she was having cough, congestion.  She has been taking over-the-counter guaifenesin and dextromethorphan.  States tonight she started having diarrhea, nausea, palpitations and started feeling like she was suicidal.  Reports this has happened to her before where she has felt suicidal after taking Percocet.  She was concerned that this could have been an adverse reaction to the cough medication.  She states she has only had 2 doses in the past 24 hours.  Denies any other medications.  No illicit drug use or alcohol use.  She states she is no longer feeling suicidal.  No history of psychiatric illness.  No HI.  She denies any fever.  No abdominal pain.  No vomiting.  States she has had some urinary frequency but no dysuria, hematuria, vaginal bleeding or discharge.  ROS: See HPI Constitutional: no fever  Eyes: no drainage  ENT: no runny nose   Cardiovascular:  no chest pain  Resp: no SOB  GI: no vomiting GU: no dysuria Integumentary: no rash  Allergy: no hives  Musculoskeletal: no leg swelling  Neurological: no slurred speech ROS otherwise negative  PAST MEDICAL HISTORY/PAST SURGICAL HISTORY:  Past Medical History:  Diagnosis Date  . Allergic rhinitis   . Bartholin gland cyst 05/04/2013  . GERD (gastroesophageal reflux disease)   . Migraine without aura, without mention of intractable migraine without mention of status migrainosus 07/09/2013  . Ovarian cyst 2014  . Vaginal Pap smear, abnormal     MEDICATIONS:  Prior to Admission medications   Medication Sig Start Date End Date Taking? Authorizing Provider  Cholecalciferol 50000 units TABS 50,000 units PO qwk for 12 weeks. 05/12/18   Briscoe Deutscher, DO  docusate sodium (COLACE) 100 MG capsule Take 1 capsule (100 mg  total) by mouth 2 (two) times daily. 04/13/18   Vivi Barrack, MD  polyethylene glycol powder (GLYCOLAX/MIRALAX) powder Take 17 g by mouth 2 (two) times daily as needed. 04/13/18   Vivi Barrack, MD  valACYclovir (VALTREX) 1000 MG tablet Take 1 tablet (1,000 mg total) by mouth 2 (two) times daily. 06/13/18   Marletta Lor, MD    ALLERGIES:  Allergies  Allergen Reactions  . Iodinated Diagnostic Agents Hives  . Percocet [Oxycodone-Acetaminophen] Itching    SOCIAL HISTORY:  Social History   Tobacco Use  . Smoking status: Never Smoker  . Smokeless tobacco: Never Used  Substance Use Topics  . Alcohol use: Yes    Comment: 2/month    FAMILY HISTORY: Family History  Problem Relation Age of Onset  . Hypertension Mother   . Heart disease Father   . Alcohol abuse Father   . Mental retardation Maternal Uncle   . Diabetes Maternal Grandmother   . Hypertension Maternal Grandmother   . Kidney disease Maternal Grandmother   . Heart disease Maternal Grandmother   . Stroke Maternal Grandmother   . Prostate cancer Maternal Grandfather   . Alcohol abuse Other   . Hypertension Paternal Grandmother     EXAM: BP 114/75   Pulse 92   Temp 98.4 F (36.9 C) (Oral)   Resp 17   Ht 5\' 5"  (1.651 m)   Wt 83.9 kg   SpO2 100%   BMI 30.79 kg/m  CONSTITUTIONAL: Alert and oriented and responds appropriately to questions. Well-appearing; well-nourished HEAD:  Normocephalic EYES: Conjunctivae clear, pupils appear equal, EOMI ENT: normal nose; moist mucous membranes NECK: Supple, no meningismus, no nuchal rigidity, no LAD  CARD: RRR; S1 and S2 appreciated; no murmurs, no clicks, no rubs, no gallops RESP: Normal chest excursion without splinting or tachypnea; breath sounds clear and equal bilaterally; no wheezes, no rhonchi, no rales, no hypoxia or respiratory distress, speaking full sentences ABD/GI: Normal bowel sounds; non-distended; soft, non-tender, no rebound, no guarding, no peritoneal  signs, no hepatosplenomegaly BACK:  The back appears normal and is non-tender to palpation, there is no CVA tenderness EXT: Normal ROM in all joints; non-tender to palpation; no edema; normal capillary refill; no cyanosis, no calf tenderness or swelling    SKIN: Normal color for age and race; warm; no rash NEURO: Moves all extremities equally PSYCH: The patient's mood and manner are appropriate. Grooming and personal hygiene are appropriate.  Patient contracts for safety.  MEDICAL DECISION MAKING: Patient here with symptoms of viral illness.  She complains of cough, congestion, nausea, diarrhea, urinary urgency.  No fever.  No pain.  Labs, urine unremarkable.  Patient seems most concerned about the palpitations and suicidal thoughts that she thinks are related to the cough medication that she took.  No overdose of this medication.  Recommended that she avoid this medication in the future.  She has no SI, HI at this time and can contract for safety.  No history of psychiatric illness and I do not feel needs emergent psychiatric evaluation.  ED PROGRESS: Patient eloped from the ED prior to receiving EKG or Zofran.  Told EMT that she could not wait any longer.  I was not able to go back and reassess the patient and encourage her to stay for her EKG.  Not get to talk to the patient about leaving [place].   I reviewed all nursing notes, vitals, pertinent previous records, EKGs, lab and urine results, imaging (as available).        Arnell Slivinski, Delice Bison, DO 09/05/18 [phone]

## 2018-09-05 NOTE — ED Triage Notes (Signed)
Pt presents with multiple complaints. Pt states she has been sick with cold like S/S over the past few days. Started taking OTC meds for S/S. Pt developed diarrhea, N/V, HA, and suicidal thoughts.

## 2018-09-05 NOTE — ED Triage Notes (Signed)
When questing pt further about the suicidal thoughts pt states "I think its related to the medicine, it happened to me when I took Percocet in the past." Pt states she isn't actually suicidal.

## 2018-09-05 NOTE — Addendum Note (Signed)
Addended by: Briscoe Deutscher R on: 09/05/2018 07:21 PM   Modules accepted: Orders

## 2018-09-05 NOTE — Progress Notes (Signed)
   Veronica Raymond is a 32 y.o. female here for an acute visit.  History of Present Illness:   [name], CMA acting as scribe for Dr. Briscoe Deutscher.   HPI: Patient went to ED last night for palpitation and headache with frequent urination and nausea and loose stool. Had body aches. Took Mucinex D. Went to the ED. Labs normal. Feels better this morning, but exhausted because she did not get any sleep last night.  PMHx, SurgHx, SocialHx, Medications, and Allergies were reviewed in the Visit Navigator and updated as appropriate.  Current Medications:   .  None  Allergies  Allergen Reactions  . Decongestant [Pseudoephedrine]   . Iodinated Diagnostic Agents Hives  . Percocet [Oxycodone-Acetaminophen] Itching   Review of Systems:   Pertinent items are noted in the HPI. Otherwise, ROS is negative.  Vitals:   Vitals:   09/05/18 0744  BP: 118/76  Pulse: 89  Temp: 98.6 F (37 C)  TempSrc: Oral  SpO2: 99%  Weight: 185 lb (83.9 kg)  Height: 5\' 5"  (1.651 m)     Body mass index is 30.79 kg/m.  Physical Exam:   Physical Exam  Constitutional: She appears well-nourished.  HENT:  Head: Normocephalic and atraumatic.  Nose: Rhinorrhea present.  Eyes: Pupils are equal, round, and reactive to light. EOM are normal.  Neck: Normal range of motion. Neck supple.  Cardiovascular: Normal rate, regular rhythm, normal heart sounds and intact distal pulses.  Pulmonary/Chest: Effort normal.  Abdominal: Soft.  Skin: Skin is warm.  Psychiatric: She has a normal mood and affect. Her behavior is normal.  Nursing note and vitals reviewed.  EKG: normal EKG, normal sinus rhythm.   Assessment and Plan:   Terrian was seen today for cough.  Diagnoses and all orders for this visit:  Palpitation Comments: EKG WNL. Orders: -     EKG 12-Lead  Viral upper respiratory tract infection Comments: Advised rest and fluids.  Medication intolerance Comments: Advised to avoid  decongestants in the future.    . Reviewed expectations re: course of current medical issues. . Discussed self-management of symptoms. . Outlined signs and symptoms indicating need for more acute intervention. . Patient verbalized understanding and all questions were answered. Marland Kitchen Health Maintenance issues including appropriate healthy diet, exercise, and smoking avoidance were discussed with patient. . See orders for this visit as documented in the electronic medical record. . Patient received an After Visit Summary.  CMA served as Education administrator during this visit. History, Physical, and Plan performed by medical provider. The above documentation has been reviewed and is accurate and complete. Briscoe Deutscher, D.O.  Briscoe Deutscher, DO [place], [facility] 09/05/2018

## 2018-09-05 NOTE — ED Notes (Signed)
Pt came to desk to notify staff that she can not wait any longer and is leaving. Stated to Pt that the Doctor just ordered medication for her but she still stated she can not wait any longer

## 2018-09-06 ENCOUNTER — Ambulatory Visit (INDEPENDENT_AMBULATORY_CARE_PROVIDER_SITE_OTHER): Payer: Self-pay | Admitting: Family Medicine

## 2018-09-06 ENCOUNTER — Telehealth: Payer: Self-pay | Admitting: Family Medicine

## 2018-09-06 ENCOUNTER — Encounter (HOSPITAL_COMMUNITY): Payer: Self-pay | Admitting: Emergency Medicine

## 2018-09-06 ENCOUNTER — Ambulatory Visit (HOSPITAL_COMMUNITY)
Admission: EM | Admit: 2018-09-06 | Discharge: 2018-09-06 | Disposition: A | Discharge status: Home / Self Care | Payer: No Typology Code available for payment source | Source: Home / Self Care

## 2018-09-06 ENCOUNTER — Emergency Department (HOSPITAL_COMMUNITY)
Admission: EM | Admit: 2018-09-06 | Discharge: 2018-09-07 | Disposition: A | Discharge status: Home / Self Care | Payer: No Typology Code available for payment source | Attending: Emergency Medicine | Admitting: Emergency Medicine

## 2018-09-06 VITALS — BP 105/75 | HR 68 | Temp 98.7°F | Resp 16 | Wt 185.0 lb

## 2018-09-06 DIAGNOSIS — R110 Nausea: Secondary | ICD-10-CM

## 2018-09-06 DIAGNOSIS — N3090 Cystitis, unspecified without hematuria: Secondary | ICD-10-CM | POA: Insufficient documentation

## 2018-09-06 DIAGNOSIS — N390 Urinary tract infection, site not specified: Secondary | ICD-10-CM

## 2018-09-06 DIAGNOSIS — R6889 Other general symptoms and signs: Secondary | ICD-10-CM

## 2018-09-06 DIAGNOSIS — R1033 Periumbilical pain: Secondary | ICD-10-CM | POA: Diagnosis not present

## 2018-09-06 LAB — POCT INFLUENZA A/B
Influenza A, POC: NEGATIVE
Influenza B, POC: NEGATIVE

## 2018-09-06 NOTE — ED Notes (Signed)
[facility]

## 2018-09-06 NOTE — ED Notes (Signed)
Unable to collect labs patient is laying on the floor

## 2018-09-06 NOTE — Telephone Encounter (Signed)
Patient states her blood sugar was 120 at the ER. It is now 110 and she is having frequent urination like every 3-5 minutes followed by nausea and headache. Patient complains of dry mouth. Patient reports that her urine was noted to have a small amt of blood in it. Patient reports that she believes that she might be diabetic. Patient reports that she was seen by her pcp and prescribed immodium and zofran. PER [facility]

## 2018-09-06 NOTE — ED Triage Notes (Signed)
Patient arrived via EMS. Patient reports abdominal pain, nausea, and frequent urination x 3 days. Reports urinating q 2 min, diarrhea q 40 min. Was given Zofran yesterday w/ no relief, imodium for diarrhea with relief. Patient had a head cold  Vitals  -BP 130/90  -HR 80 -CBG 118

## 2018-09-06 NOTE — Telephone Encounter (Signed)
She is going to go to urgent care she will not be able to get here before we close.

## 2018-09-06 NOTE — Telephone Encounter (Signed)
Left message to return call to our office.  

## 2018-09-06 NOTE — Progress Notes (Addendum)
Veronica Raymond is a 32 y.o. female who presents today with concerns of needing a flu test. She is under the care of her PCP who she has an appointment with in the morning and is wanting to know if her symptoms have been caused by the flu tonight as she is hesitant about exposing her children to her if she has known flu. She has been seen multiple times in various healthcare locations over he last 48 hours with a few of them [facility]. Her PCP is aware and has an appointment for her 09/07/2018 per record review and patient report. She was sent by urgent care and reports that they referred her because they do not perform flu tests.   O: Vitals:   09/06/18 1823  BP: 105/75  Pulse: 68  Resp: 16  Temp: 98.7 F (37.1 C)  SpO2: 99%   A: 1. Flu-like symptoms    P: Discussed test findings and advised scheduled follow up with PCP in the AM as planned due to provider request of labs that are not available at this location. VSS and patient denies SI/HI at this time which was documented in the previous record for visit on 12/10 where patient reports feelings are associated with medications specifically cough and cold preparations. She has discontinued all OTC cough and cold medications and is not currently on any medications at this time. She denies any other concerns at this time and states that she will follow up with PCP in the AM. Patient verbalized understanding of information provided and agrees with plan of care (POC), all questions answered.  1. Flu-like symptoms - POCT Influenza A/B Results for orders placed or performed in visit on 09/06/18 (from the past 24 hour(s))  POCT Influenza A/B     Status: Normal   Collection Time: 09/06/18  6:34 PM  Result Value Ref Range   Influenza A, POC Negative Negative   Influenza B, POC Negative Negative

## 2018-09-06 NOTE — Patient Instructions (Signed)

## 2018-09-06 NOTE — Telephone Encounter (Signed)
She needs labs. Orders: CBC, CMP, A1C, Monospot, flu. Okay to come in today if she can get here quickly. She likely needs fluids. May try urgent care at this point for quick testing and fluids. Okay orders for tomorrow morning if she decides to wait.

## 2018-09-06 NOTE — Telephone Encounter (Signed)
Patient called in Zofran not working, she is still having a lot of [place]. She is not having fever but does have chills. No no pain with urination but having frequent urination. She is not having any abdominal pain or back pain. She is having very bad Headache 10/10 today she is having sensitivity to sound not light. extreme fatigue. She thinks it is due to the nausea and frequent urination.

## 2018-09-07 ENCOUNTER — Other Ambulatory Visit: Payer: Self-pay

## 2018-09-07 ENCOUNTER — Encounter (HOSPITAL_COMMUNITY): Payer: Self-pay

## 2018-09-07 DIAGNOSIS — N3090 Cystitis, unspecified without hematuria: Secondary | ICD-10-CM

## 2018-09-07 DIAGNOSIS — R1033 Periumbilical pain: Secondary | ICD-10-CM | POA: Insufficient documentation

## 2018-09-07 LAB — COMPREHENSIVE METABOLIC PANEL
ALBUMIN: 4.5 g/dL (ref 3.5–5.0)
ALK PHOS: 43 U/L (ref 38–126)
ALT: 14 U/L (ref 0–44)
AST: 17 U/L (ref 15–41)
Anion gap: 10 (ref 5–15)
BUN: 9 mg/dL (ref 6–20)
CO2: 24 mmol/L (ref 22–32)
Calcium: 9.7 mg/dL (ref 8.9–10.3)
Chloride: 104 mmol/L (ref 98–111)
Creatinine, Ser: 0.81 mg/dL (ref 0.44–1.00)
GFR calc Af Amer: >60 mL/min (ref >60)
GFR calc non Af Amer: >60 mL/min (ref >60)
Glucose, Bld: 108 mg/dL — ABNORMAL HIGH (ref 70–99)
Potassium: 3.6 mmol/L (ref 3.5–5.1)
Sodium: 138 mmol/L (ref 135–145)
TOTAL PROTEIN: 8.2 g/dL — AB (ref 6.5–8.1)
Total Bilirubin: 0.9 mg/dL (ref 0.3–1.2)

## 2018-09-07 LAB — URINALYSIS, ROUTINE W REFLEX MICROSCOPIC
Bilirubin Urine: NEGATIVE
Glucose, UA: NEGATIVE mg/dL
Ketones, ur: NEGATIVE mg/dL
NITRITE: NEGATIVE
Protein, ur: NEGATIVE mg/dL
Specific Gravity, Urine: 1.009 (ref 1.005–1.030)
pH: 6 (ref 5.0–8.0)

## 2018-09-07 LAB — CBC
HEMATOCRIT: 42.3 % (ref 36.0–46.0)
Hemoglobin: 13.5 g/dL (ref 12.0–15.0)
MCH: 30.2 pg (ref 26.0–34.0)
MCHC: 31.9 g/dL (ref 30.0–36.0)
MCV: 94.6 fL (ref 80.0–100.0)
Platelets: 340 10*3/uL (ref 150–400)
RBC: 4.47 MIL/uL (ref 3.87–5.11)
RDW: 12.1 % (ref 11.5–15.5)
WBC: 6.7 10*3/uL (ref 4.0–10.5)
nRBC: 0 % (ref 0.0–0.2)

## 2018-09-07 LAB — I-STAT BETA HCG BLOOD, ED (MC, WL, AP ONLY)

## 2018-09-07 LAB — LIPASE, BLOOD: Lipase: 36 U/L (ref 11–51)

## 2018-09-07 MED ORDER — METOCLOPRAMIDE HCL 10 MG PO TABS: 10.0000 mg | ORAL_TABLET | Freq: Four times a day (QID) | ORAL | 0 refills | Status: DC | PRN

## 2018-09-07 MED ORDER — CEPHALEXIN 500 MG PO CAPS: 500.0000 mg | ORAL_CAPSULE | Freq: Two times a day (BID) | ORAL | 0 refills | Status: DC

## 2018-09-07 MED ORDER — SODIUM CHLORIDE 0.9 % IV SOLN
INTRAVENOUS | Status: AC
  Filled 2018-09-07: qty 10

## 2018-09-07 MED ORDER — ONDANSETRON HCL 4 MG/2ML IJ SOLN
INTRAMUSCULAR | Status: AC
  Filled 2018-09-07: qty 2

## 2018-09-07 MED ORDER — METOCLOPRAMIDE HCL 5 MG/ML IJ SOLN
INTRAMUSCULAR | Status: AC
  Filled 2018-09-07: qty 2

## 2018-09-07 MED ORDER — FAMOTIDINE IN NACL 20-0.9 MG/50ML-% IV SOLN
INTRAVENOUS | Status: AC
  Filled 2018-09-07: qty 50

## 2018-09-07 MED FILL — METOCLOPRAMIDE 10 MG TABLET: 10 | 3 days supply | Qty: 12 | Fill #0

## 2018-09-07 MED FILL — CEPHALEXIN 500 MG CAPSULE: 500 | 5 days supply | Qty: 10 | Fill #0

## 2018-09-07 NOTE — ED Triage Notes (Signed)
Pt reports abdominal pain, nausea, and frequency x4 days. Pt was seen here last night for the same symptoms. Pt states that she received IV antibiotics and was sent home with antibiotics last night. Pt reports that she felt better when she left last night but work up this morning and started feeling the same as yesterday.

## 2018-09-07 NOTE — ED Notes (Signed)
Patient is resting comfortably. 

## 2018-09-07 NOTE — ED Provider Notes (Signed)
See Epic downtime paper documentation.   Veronica Raymond, [name], MD 09/07/18 [phone]

## 2018-09-08 ENCOUNTER — Emergency Department (HOSPITAL_COMMUNITY)
Admission: EM | Admit: 2018-09-08 | Discharge: 2018-09-08 | Disposition: A | Discharge status: Home / Self Care | Payer: No Typology Code available for payment source | Source: Home / Self Care | Attending: Emergency Medicine | Admitting: Emergency Medicine

## 2018-09-08 ENCOUNTER — Emergency Department (HOSPITAL_COMMUNITY): Payer: No Typology Code available for payment source

## 2018-09-08 DIAGNOSIS — R1013 Epigastric pain: Secondary | ICD-10-CM

## 2018-09-08 DIAGNOSIS — N3090 Cystitis, unspecified without hematuria: Secondary | ICD-10-CM

## 2018-09-08 LAB — CBC WITH DIFFERENTIAL/PLATELET
Abs Immature Granulocytes: 0.03 10*3/uL (ref 0.00–0.07)
Basophils Absolute: 0 10*3/uL (ref 0.0–0.1)
Basophils Relative: 0 %
Eosinophils Absolute: 0 10*3/uL (ref 0.0–0.5)
Eosinophils Relative: 1 %
HCT: 37.5 % (ref 36.0–46.0)
Hemoglobin: 11.8 g/dL — ABNORMAL LOW (ref 12.0–15.0)
Immature Granulocytes: 0 %
Lymphocytes Relative: 46 %
Lymphs Abs: 3.6 10*3/uL (ref 0.7–4.0)
MCH: 29.7 pg (ref 26.0–34.0)
MCHC: 31.5 g/dL (ref 30.0–36.0)
MCV: 94.5 fL (ref 80.0–100.0)
MONOS PCT: 8 %
Monocytes Absolute: 0.6 10*3/uL (ref 0.1–1.0)
Neutro Abs: 3.5 10*3/uL (ref 1.7–7.7)
Neutrophils Relative %: 45 %
Platelets: 334 10*3/uL (ref 150–400)
RBC: 3.97 MIL/uL (ref 3.87–5.11)
RDW: 12 % (ref 11.5–15.5)
WBC: 7.8 10*3/uL (ref 4.0–10.5)
nRBC: 0 % (ref 0.0–0.2)

## 2018-09-08 LAB — URINALYSIS, ROUTINE W REFLEX MICROSCOPIC
Bilirubin Urine: NEGATIVE
Glucose, UA: NEGATIVE mg/dL
Ketones, ur: 5 mg/dL — AB
Leukocytes, UA: NEGATIVE
Nitrite: NEGATIVE
Protein, ur: NEGATIVE mg/dL
RBC / HPF: >50 RBC/hpf — ABNORMAL HIGH (ref 0–5)
Specific Gravity, Urine: 1.016 (ref 1.005–1.030)
pH: 6 (ref 5.0–8.0)

## 2018-09-08 LAB — COMPREHENSIVE METABOLIC PANEL
ALBUMIN: 4.1 g/dL (ref 3.5–5.0)
ALK PHOS: 37 U/L — AB (ref 38–126)
ALT: 12 U/L (ref 0–44)
AST: 15 U/L (ref 15–41)
Anion gap: 11 (ref 5–15)
BUN: 10 mg/dL (ref 6–20)
CO2: 24 mmol/L (ref 22–32)
Calcium: 9.4 mg/dL (ref 8.9–10.3)
Chloride: 106 mmol/L (ref 98–111)
Creatinine, Ser: 0.9 mg/dL (ref 0.44–1.00)
GFR calc Af Amer: >60 mL/min (ref >60)
GFR calc non Af Amer: >60 mL/min (ref >60)
Glucose, Bld: 108 mg/dL — ABNORMAL HIGH (ref 70–99)
Potassium: 3.6 mmol/L (ref 3.5–5.1)
Sodium: 141 mmol/L (ref 135–145)
Total Bilirubin: 0.7 mg/dL (ref 0.3–1.2)
Total Protein: 7.7 g/dL (ref 6.5–8.1)

## 2018-09-08 MED ORDER — ONDANSETRON HCL 4 MG/2ML IJ SOLN: 4.0000 mg | Freq: Once | INTRAMUSCULAR | Status: DC

## 2018-09-08 MED ORDER — PROMETHAZINE HCL 25 MG PO TABS: 25.0000 mg | ORAL_TABLET | Freq: Four times a day (QID) | ORAL | 0 refills | Status: DC | PRN

## 2018-09-08 MED ORDER — PROMETHAZINE HCL 25 MG/ML IJ SOLN
25.0000 mg | Freq: Once | INTRAMUSCULAR | Status: DC
  Filled 2018-09-08: qty 1

## 2018-09-08 MED ORDER — KETOROLAC TROMETHAMINE 10 MG PO TABS: 10.0000 mg | ORAL_TABLET | Freq: Four times a day (QID) | ORAL | 0 refills | Status: DC | PRN

## 2018-09-08 MED ORDER — CEPHALEXIN 500 MG PO CAPS
500.0000 mg | ORAL_CAPSULE | Freq: Once | ORAL | Status: AC
  Administered 2018-09-08: 500 mg via ORAL
  Filled 2018-09-08: qty 1

## 2018-09-08 MED ORDER — KETOROLAC TROMETHAMINE 15 MG/ML IJ SOLN
15.0000 mg | Freq: Once | INTRAMUSCULAR | Status: AC
  Administered 2018-09-08: 15 mg via INTRAVENOUS
  Filled 2018-09-08: qty 1

## 2018-09-08 MED ORDER — PROMETHAZINE HCL 25 MG PO TABS
50.0000 mg | ORAL_TABLET | Freq: Once | ORAL | Status: AC
  Administered 2018-09-08: 50 mg via ORAL
  Filled 2018-09-08: qty 2

## 2018-09-08 NOTE — ED Provider Notes (Signed)
[facility] DEPT Provider Note   CSN: [id number] Arrival date & time: 09/07/18  2037     History   Chief Complaint Chief Complaint  Patient presents with  . Abdominal Pain  . Urinary Frequency  . Nausea    HPI Veronica Raymond is a 32 y.o. female.  HPI  32 year old female comes in a chief complaint of abdominal pain, nausea and urinary frequency.  Patient reports that she has had urinary frequency for the last 3 or 4 days.  She has been seen in the ER and was started on Keflex.  She was seen last night and felt better at the time of discharge, however in the morning she started having nausea and her pain got worse.  Patient is not having any fevers but reports subjective chills.  She has history of ovarian cyst otherwise there is no history of abdominal surgery.  She also has history of migraines, but has not had any abdominal migraines.  There is no known history of pelvic disorders and currently patient is on her period.  Patient denies any history of STD within the last couple of years and she does not have any risk factors for the same.  Patient is abdominal pain is located in the upper quadrants, and on occasion there is flank pain.  Past Medical History:  Diagnosis Date  . Allergic rhinitis   . Bartholin gland cyst 05/04/2013  . GERD (gastroesophageal reflux disease)   . Migraine without aura, without mention of intractable migraine without mention of status migrainosus 07/09/2013  . Ovarian cyst 2014  . Vaginal Pap smear, abnormal     Patient Active Problem List   Diagnosis Date Noted  . History of abnormal cervical Pap smear 01/28/2018  . Cigar smoker 01/28/2018  . Allergic rhinitis   . GERD (gastroesophageal reflux disease)   . Migraine without aura, without mention of intractable migraine without mention of status migrainosus 07/09/2013    Past Surgical History:  Procedure Laterality Date  . ADENOIDECTOMY    . ESSURE TUBAL  LIGATION  07-05-2014  . THERAPEUTIC ABORTION    . TONSILLECTOMY    . TYMPANOSTOMY TUBE PLACEMENT       OB History    Gravida  5   Para  4   Term  4   Preterm      AB  1   Living  4     SAB      TAB  1   Ectopic      Multiple      Live Births  4            Home Medications    Prior to Admission medications   Medication Sig Start Date End Date Taking? Authorizing Provider  cephALEXin (KEFLEX) 500 MG capsule Take 1 capsule (500 mg total) by mouth 2 (two) times daily. 09/07/18  Yes Molpus, John, MD  ketorolac (TORADOL) 10 MG tablet Take 1 tablet (10 mg total) by mouth every 6 (six) hours as needed. 09/08/18   Varney Biles, MD  promethazine (PHENERGAN) 25 MG tablet Take 1 tablet (25 mg total) by mouth every 6 (six) hours as needed for nausea or vomiting. 09/08/18   Varney Biles, MD    Family History Family History  Problem Relation Age of Onset  . Hypertension Mother   . Heart disease Father   . Alcohol abuse Father   . Mental retardation Maternal Uncle   . Diabetes Maternal Grandmother   .  Hypertension Maternal Grandmother   . Kidney disease Maternal Grandmother   . Heart disease Maternal Grandmother   . Stroke Maternal Grandmother   . Prostate cancer Maternal Grandfather   . Alcohol abuse Other   . Hypertension Paternal Grandmother     Social History Social History   Tobacco Use  . Smoking status: Never Smoker  . Smokeless tobacco: Never Used  Substance Use Topics  . Alcohol use: Yes    Comment: 2/month  . Drug use: No     Allergies   Decongestant [pseudoephedrine]; Iodinated diagnostic agents; and Percocet [oxycodone-acetaminophen]   Review of Systems Review of Systems  Constitutional: Positive for activity change.  Respiratory: Negative for shortness of breath.   Cardiovascular: Negative for chest pain.  Gastrointestinal: Positive for abdominal pain and nausea. Negative for vomiting.  Genitourinary: Positive for flank pain,  frequency and vaginal bleeding. Negative for dysuria, hematuria and vaginal discharge.  All other systems reviewed and are negative.    Physical Exam Updated Vital Signs BP 113/78 (BP Location: Left Arm)   Pulse 84   Temp 98.3 F (36.8 C) (Oral)   Resp (!) 21   Ht 5\' 5"  (1.651 m)   Wt 83.9 kg   SpO2 100%   BMI 30.78 kg/m   Physical Exam Vitals signs and nursing note reviewed.  Constitutional:      Appearance: She is well-developed.  HENT:     Head: Normocephalic and atraumatic.  Neck:     Musculoskeletal: Normal range of motion and neck supple.  Cardiovascular:     Rate and Rhythm: Normal rate.  Pulmonary:     Effort: Pulmonary effort is normal.  Abdominal:     General: Bowel sounds are normal.     Tenderness: There is abdominal tenderness in the periumbilical area. There is no right CVA tenderness, left CVA tenderness, guarding or rebound.  Skin:    General: Skin is warm and dry.  Neurological:     Mental Status: She is alert and oriented to person, place, and time.      ED Treatments / Results  Labs (all labs ordered are listed, but only abnormal results are displayed) Labs Reviewed  COMPREHENSIVE METABOLIC PANEL - Abnormal; Notable for the following components:      Result Value   Glucose, Bld 108 (*)    Alkaline Phosphatase 37 (*)    All other components within normal limits  CBC WITH DIFFERENTIAL/PLATELET - Abnormal; Notable for the following components:   Hemoglobin 11.8 (*)    All other components within normal limits  URINALYSIS, ROUTINE W REFLEX MICROSCOPIC - Abnormal; Notable for the following components:   Hgb urine dipstick LARGE (*)    Ketones, ur 5 (*)    RBC / HPF >50 (*)    Bacteria, UA RARE (*)    All other components within normal limits    EKG None  Radiology US Abdomen Complete  Result Date: 09/08/2018 CLINICAL DATA:  32 year old female with epigastric pain. EXAM: ABDOMEN ULTRASOUND COMPLETE COMPARISON:  Abdominal radiograph  dated 04/13/2018 FINDINGS: Gallbladder: No gallstones or wall thickening visualized. No sonographic Murphy sign noted by sonographer. Common bile duct: Diameter: 4 mm Liver: The liver is unremarkable as visualized. Portal vein is patent on color Doppler imaging with normal direction of blood flow towards the liver. IVC: No abnormality visualized. Pancreas: Visualized portion unremarkable. Spleen: Size and appearance within normal limits. Right Kidney: Length: 9.1 cm. Normal echogenicity. No hydronephrosis or shadowing stone. Left Kidney: Length: 10.1 cm. Normal  echogenicity. No hydronephrosis or shadowing stone. Abdominal aorta: No aneurysm visualized. Other findings: None. IMPRESSION: Unremarkable abdominal ultrasound. Electronically Signed   By: Anner Crete M.D.   On: 09/08/2018 01:45    Procedures Procedures (including critical care time)  Medications Ordered in ED Medications  ketorolac (TORADOL) 15 MG/ML injection 15 mg (15 mg Intravenous Given 09/08/18 0256)  cephALEXin (KEFLEX) capsule 500 mg (500 mg Oral Given 09/08/18 0257)  promethazine (PHENERGAN) tablet 50 mg (50 mg Oral Given 09/08/18 0257)     Initial Impression / Assessment and Plan / ED Course  I have reviewed the triage vital signs and the nursing notes.  Pertinent labs & imaging results that were available during my care of the patient were reviewed by me and considered in my medical decision making (see chart for details).  Clinical Course as of Sep 09 339  Fri Sep 08, 2018  0337 Results from the ER workup discussed with the patient face to face and all questions answered to the best of my ability.  All the labs and ultrasound are reassuring.  Patient has unchanged abdominal exam.  She reports that Toradol helped her significantly.  She would want Toradol to be prescribed to her.  I suspect that the symptoms are because of her underlying infection, and have encouraged her to continue taking her antibiotics.  Strict  ER return precautions have been discussed, and patient is agreeing with the plan and is comfortable with the workup done and the recommendations from the ER.   US Abdomen Complete [AN]    Clinical Course User Index [AN] Varney Biles, MD    32 year old female comes in a chief complaint of persistent abdominal pain with nausea.  She has been sick for the last 3 to 4 days.  Patient was seen in the ER 2 days ago and then again yesterday.  Last night she was started on antibiotics with a diagnosis of cystitis.  Patient is on her periods, however her pain is different than her menstrual pain.  She does not have any lower quadrant abdominal tenderness and does not have any risk factors for STD.  There is no abdominal surgical history or pelvic surgical history, and her physical exam does not reveal any peritoneal findings.  Vital signs are stable and within normal limits.  Labs from yesterday reviewed. My suspicion for acute peritoneal pathology for this pain is low.  Patient is having urinary frequency therefore UTI is possible.  Basic labs have been ordered.  Ultrasound abdomen complete has been ordered to look at her gallbladder and also ensure there is no evidence of hydronephrosis.  Patient is immunocompetent and nontoxic-appearing.  Final Clinical Impressions(s) / ED Diagnoses   Final diagnoses:  Epigastric pain  Cystitis    ED Discharge Orders         Ordered    promethazine (PHENERGAN) 25 MG tablet  Every 6 hours PRN     09/08/18 0330    ketorolac (TORADOL) 10 MG tablet  Every 6 hours PRN,   Status:  Discontinued     09/08/18 0330    ketorolac (TORADOL) 10 MG tablet  Every 6 hours PRN     09/08/18 0336           Varney Biles, MD 09/08/18 [phone]

## 2018-09-08 NOTE — Discharge Instructions (Addendum)
Recently in the ER for abdominal pain, nausea and vomiting. Our work-up overall is reassuring and the kidney enzyme and the ultrasound results are normal. We suspect that your symptoms will improve with continued antibiotic use.  Please return to the ER if your symptoms worsen; you have increased pain, fevers, chills, inability to keep any medications down, confusion. Otherwise see the outpatient doctor as requested.

## 2018-09-11 ENCOUNTER — Ambulatory Visit (INDEPENDENT_AMBULATORY_CARE_PROVIDER_SITE_OTHER): Payer: No Typology Code available for payment source | Admitting: Physician Assistant

## 2018-09-11 ENCOUNTER — Ambulatory Visit (INDEPENDENT_AMBULATORY_CARE_PROVIDER_SITE_OTHER)
Admission: RE | Admit: 2018-09-11 | Discharge: 2018-09-11 | Disposition: A | Discharge status: Home / Self Care | Payer: No Typology Code available for payment source | Source: Ambulatory Visit | Attending: Physician Assistant | Admitting: Physician Assistant

## 2018-09-11 ENCOUNTER — Other Ambulatory Visit: Payer: Self-pay | Admitting: Physician Assistant

## 2018-09-11 ENCOUNTER — Encounter: Payer: Self-pay | Admitting: Physician Assistant

## 2018-09-11 VITALS — BP 110/72 | HR 76 | Temp 97.8°F | Ht 65.0 in | Wt 184.4 lb

## 2018-09-11 DIAGNOSIS — R1084 Generalized abdominal pain: Secondary | ICD-10-CM

## 2018-09-11 DIAGNOSIS — R197 Diarrhea, unspecified: Secondary | ICD-10-CM

## 2018-09-11 DIAGNOSIS — R6883 Chills (without fever): Secondary | ICD-10-CM | POA: Diagnosis not present

## 2018-09-11 LAB — CBC WITH DIFFERENTIAL/PLATELET
Basophils Absolute: 0 10*3/uL (ref 0.0–0.1)
Basophils Relative: 0.7 % (ref 0.0–3.0)
EOS ABS: 0.1 10*3/uL (ref 0.0–0.7)
Eosinophils Relative: 1.3 % (ref 0.0–5.0)
HCT: 38.8 % (ref 36.0–46.0)
Hemoglobin: 12.7 g/dL (ref 12.0–15.0)
Lymphocytes Relative: 48.8 % — ABNORMAL HIGH (ref 12.0–46.0)
Lymphs Abs: 2.7 10*3/uL (ref 0.7–4.0)
MCHC: 32.8 g/dL (ref 30.0–36.0)
MCV: 92.7 fl (ref 78.0–100.0)
Monocytes Absolute: 0.4 10*3/uL (ref 0.1–1.0)
Monocytes Relative: 7.5 % (ref 3.0–12.0)
Neutro Abs: 2.3 10*3/uL (ref 1.4–7.7)
Neutrophils Relative %: 41.7 % — ABNORMAL LOW (ref 43.0–77.0)
Platelets: 364 10*3/uL (ref 150.0–400.0)
RBC: 4.18 Mil/uL (ref 3.87–5.11)
RDW: 12.7 % (ref 11.5–15.5)
WBC: 5.6 10*3/uL (ref 4.0–10.5)

## 2018-09-11 LAB — COMPREHENSIVE METABOLIC PANEL
ALT: 8 U/L (ref 0–35)
AST: 11 U/L (ref 0–37)
Albumin: 4.3 g/dL (ref 3.5–5.2)
Alkaline Phosphatase: 41 U/L (ref 39–117)
BUN: 10 mg/dL (ref 6–23)
CO2: 30 mEq/L (ref 19–32)
Calcium: 9.7 mg/dL (ref 8.4–10.5)
Chloride: 100 mEq/L (ref 96–112)
Creatinine, Ser: 0.91 mg/dL (ref 0.40–1.20)
GFR: 92.48 mL/min (ref >60.00)
Glucose, Bld: 91 mg/dL (ref 70–99)
Potassium: 3.9 mEq/L (ref 3.5–5.1)
Sodium: 137 mEq/L (ref 135–145)
TOTAL PROTEIN: 7.3 g/dL (ref 6.0–8.3)
Total Bilirubin: 0.5 mg/dL (ref 0.2–1.2)

## 2018-09-11 LAB — URINALYSIS, ROUTINE W REFLEX MICROSCOPIC
BILIRUBIN URINE: NEGATIVE
Ketones, ur: NEGATIVE
LEUKOCYTES UA: NEGATIVE
Nitrite: NEGATIVE
Specific Gravity, Urine: <=1.005 — AB (ref 1.000–1.030)
Total Protein, Urine: NEGATIVE
Urine Glucose: NEGATIVE
Urobilinogen, UA: 0.2 (ref 0.0–1.0)
pH: 6 (ref 5.0–8.0)

## 2018-09-11 LAB — H. PYLORI ANTIBODY, IGG: H Pylori IgG: NEGATIVE

## 2018-09-11 LAB — POCT MONO (EPSTEIN BARR VIRUS): Mono, POC: NEGATIVE

## 2018-09-11 LAB — TSH: TSH: 0.9 u[IU]/mL (ref 0.35–4.50)

## 2018-09-11 LAB — LIPASE: Lipase: 41 U/L (ref 11.0–59.0)

## 2018-09-11 MED ORDER — FLUCONAZOLE 150 MG PO TABS: 150.0000 mg | ORAL_TABLET | Freq: Once | ORAL | 0 refills | Status: AC

## 2018-09-11 NOTE — Addendum Note (Signed)
Addended by: Erlene Quan on: 09/11/2018 01:32 PM   Modules accepted: Orders

## 2018-09-11 NOTE — Progress Notes (Signed)
Veronica Raymond is a 32 y.o. female here for a follow up of a pre-existing problem.  History of Present Illness:   Chief Complaint  Patient presents with  . Altered Mental Status  . Headache  . Back Pain    Lower x 2 days    HPI   Patient presents for follow-up of symptoms.   Course of the event as follows: 09/05/18 --went to ER for cough, congestion, palpitations, frequent urination, diarrhea.  She took guaifenesin DM and also had suicidal ideation with this medication.  Symptoms were likely related to medication side effects. 09/05/18 --followed up with primary care provider.  EKG was normal.  Diagnosed with viral illness, and told to rest and push fluids.  Avoid decongestants.  Was given Zofran as needed for nausea. 09/06/18 --went to [facility], and was administered a flu test which was negative.  She was told to follow-up with PCP for further work-up. 09/06/18 --went back to the ER.  Was diagnosed with urinary tract infection.  Was given Keflex and Reglan. 09/08/18 --went back to the ER.  Had worsening abdominal pain, frequent urination, subjective chills.  Abdominal ultrasound was negative.  She is currently having spotting that she thinks is related to her period.  At this time she is having overall improved symptoms, but still feels off.  Feels like she has a brain, frequent urination, weakness, chills.  She is on her last day of Keflex.  A urine culture was never performed in the ER based on my review.    She is also having intermittent feeling of jitteriness.  When asked about anxiety, she states that about 3 days into this course she developed some anxiety due to her health.  States that she does not have anxiety at baseline.  Denies chest pain, cough, fever, vomiting, lethargy, neck stiffness.   Past Medical History:  Diagnosis Date  . Allergic rhinitis   . Bartholin gland cyst 05/04/2013  . GERD (gastroesophageal reflux disease)   . Migraine without aura, without  mention of intractable migraine without mention of status migrainosus 07/09/2013  . Ovarian cyst 2014  . Vaginal Pap smear, abnormal      Social History   Socioeconomic History  . Marital status: Single    Spouse name: Not on file  . Number of children: 3  . Years of education: college  . Highest education level: Not on file  Occupational History  . 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 Financial resource strain: Not on file  . Food insecurity:    Worry: Not on file    Inability: Not on file  . Transportation needs:    Medical: Not on file    Non-medical: Not on file  Tobacco Use  . Smoking status: Never Smoker  . Smokeless tobacco: Never Used  Substance and Sexual Activity  . Alcohol use: Yes    Comment: 2/month  . Drug use: No  . Sexual activity: Yes    Birth control/protection: Surgical  Lifestyle  . Physical activity:    Days per week: Not on file    Minutes per session: Not on file  . Stress: Not on file  Relationships  . Social connections:    Talks on phone: Not on file    Gets together: Not on file    Attends religious service: Not on file    Active member of club or organization: Not on file  Attends meetings of clubs or organizations: Not on file    Relationship status: Not on file  . Intimate partner violence:    Fear of current or ex partner: Not on file    Emotionally abused: Not on file    Physically abused: Not on file    Forced sexual activity: Not on file  Other Topics Concern  . Not on file  Social History Narrative  . Not on file    Past Surgical History:  Procedure Laterality Date  . ADENOIDECTOMY    . ESSURE TUBAL LIGATION  07-05-2014  . THERAPEUTIC ABORTION    . TONSILLECTOMY    . TYMPANOSTOMY TUBE PLACEMENT      Family History  Problem Relation Age of Onset  . Hypertension Mother   . Heart disease Father   . Alcohol abuse Father   . Mental retardation Maternal Uncle    . Diabetes Maternal Grandmother   . Hypertension Maternal Grandmother   . Kidney disease Maternal Grandmother   . Heart disease Maternal Grandmother   . Stroke Maternal Grandmother   . Prostate cancer Maternal Grandfather   . Alcohol abuse Other   . Hypertension Paternal Grandmother     Allergies  Allergen Reactions  . Decongestant [Pseudoephedrine] Other (See Comments)    SI   . Iodinated Diagnostic Agents Hives  . Percocet [Oxycodone-Acetaminophen] Itching    Current Medications:   Current Outpatient Medications:  .  cephALEXin (KEFLEX) 500 MG capsule, Take 1 capsule (500 mg total) by mouth 2 (two) times daily., Disp: 10 capsule, Rfl: 0   Review of Systems:   ROS  Negative unless otherwise specified per HPI.  Vitals:   Vitals:   09/11/18 0831  BP: 110/72  Pulse: 76  Temp: 97.8 F (36.6 C)  TempSrc: Oral  SpO2: 99%  Weight: 184 lb 6.4 oz (83.6 kg)  Height: 5\' 5"  (1.651 m)     Body mass index is 30.69 kg/m.  Physical Exam:   Physical Exam Vitals signs and nursing note reviewed.  Constitutional:      General: She is not in acute distress.    Appearance: She is well-developed. She is not ill-appearing or toxic-appearing.  HENT:     Head: Normocephalic and atraumatic.     Right Ear: Ear canal and external ear normal.     Left Ear: Tympanic membrane, ear canal and external ear normal. Tympanic membrane is not erythematous, retracted or bulging.     Ears:     Comments: Unable to visualize right TM due to cerumen    Nose: Mucosal edema, congestion and rhinorrhea present.     Right Sinus: No maxillary sinus tenderness or frontal sinus tenderness.     Left Sinus: No maxillary sinus tenderness or frontal sinus tenderness.     Mouth/Throat:     Pharynx: Uvula midline. No posterior oropharyngeal erythema.     Tonsils: Swelling: 0 on the right. 0 on the left.  Eyes:     General: Lids are normal.     Conjunctiva/sclera: Conjunctivae normal.  Neck:     Trachea:  Trachea normal.  Cardiovascular:     Rate and Rhythm: Normal rate and regular rhythm.     Heart sounds: Normal heart sounds, S1 normal and S2 normal.  Pulmonary:     Effort: Pulmonary effort is normal.     Breath sounds: Normal breath sounds. No decreased breath sounds, wheezing, rhonchi or rales.  Lymphadenopathy:     Cervical: No cervical adenopathy.  Skin:  General: Skin is warm and dry.  Neurological:     Mental Status: She is alert.     GCS: GCS eye subscore is 4. GCS verbal subscore is 5. GCS motor subscore is 6.     Cranial Nerves: Cranial nerves are intact.     Sensory: Sensation is intact.     Coordination: Coordination is intact.  Psychiatric:        Speech: Speech normal.        Behavior: Behavior normal. Behavior is cooperative.     Results for orders placed or performed in visit on 09/11/18  POCT Mono (Epstein Barr Virus)  Result Value Ref Range   Mono, POC Negative Negative    Assessment and Plan:   Cesia was seen today for altered mental status, headache and back pain.  Diagnoses and all orders for this visit:  Generalized abdominal pain -     Urinalysis, Routine w reflex microscopic -     Urine Culture -     POCT Mono (Epstein Barr Virus) -     CBC with Differential/Platelet -     Comprehensive metabolic panel -     TSH -     Lipase -     H. pylori antibody, IgG -     CT Abdomen Pelvis Wo Contrast; Future  Chills -     POCT Mono (Epstein Barr Virus)   Briefly reviewed with primary care provider.  Unfortunately appears that there is not been a urine culture performed since onset of symptoms.  We have ordered this today as well as a urinalysis.  We are also obtaining a stat CT scan of abdomen and pelvis for further work-up.  Of note she does have a contrast allergy and cannot tolerate contrast.  She states that her prior reaction to allergy was hives.  We have also ordered routine labs today.  Vitals are stable, no red flags.  Neuro exam benign.  Advised to push fluids and rest.   . Reviewed expectations re: course of current medical issues. . Discussed self-management of symptoms. . Outlined signs and symptoms indicating need for more acute intervention. . Patient verbalized understanding and all questions were answered. . See orders for this visit as documented in the electronic medical record. . Patient received an After-Visit Summary.   Inda Coke, PA-C

## 2018-09-12 LAB — URINE CULTURE
MICRO NUMBER:: 91502228
Result:: NO GROWTH
SPECIMEN QUALITY:: ADEQUATE

## 2018-09-12 MED ORDER — TRAZODONE HCL 50 MG PO TABS: 25.0000 mg | ORAL_TABLET | Freq: Every evening | ORAL | 3 refills | Status: DC | PRN

## 2018-09-12 NOTE — Addendum Note (Signed)
Addended by: Briscoe Deutscher R on: 09/12/2018 12:34 PM   Modules accepted: Orders

## 2018-09-12 NOTE — Addendum Note (Signed)
Addended by: Briscoe Deutscher R on: 09/12/2018 11:30 AM   Modules accepted: Orders

## 2018-09-13 ENCOUNTER — Other Ambulatory Visit: Payer: Self-pay | Admitting: Physician Assistant

## 2018-09-13 ENCOUNTER — Other Ambulatory Visit: Payer: Self-pay | Admitting: Radiology

## 2018-09-13 DIAGNOSIS — R197 Diarrhea, unspecified: Secondary | ICD-10-CM

## 2018-09-13 MED ORDER — FLUCONAZOLE 150 MG PO TABS: 150.0000 mg | ORAL_TABLET | Freq: Once | ORAL | 0 refills | Status: AC

## 2018-09-13 NOTE — Addendum Note (Signed)
Addended by: Kayren Eaves T on: 09/13/2018 03:04 PM   Modules accepted: Orders

## 2018-09-18 LAB — ALPHA-GAL PANEL
Beef IgE: <0.1 kU/L (ref <0.35)
Class: 0
Class: 0
Class: 0
Galactose-alpha-1,3-galactose IgE: <0.1 kU/L (ref <0.10)
LAMB/MUTTON IGE: <0.1 kU/L (ref <0.35)
Pork IgE: <0.1 kU/L (ref <0.35)

## 2018-10-02 ENCOUNTER — Other Ambulatory Visit: Payer: Self-pay | Admitting: Family Medicine

## 2018-10-02 MED ORDER — PREDNISONE 5 MG PO TABS: ORAL_TABLET | ORAL | 0 refills | Status: DC

## 2018-10-02 MED ORDER — AZITHROMYCIN 250 MG PO TABS: ORAL_TABLET | ORAL | 0 refills | Status: DC

## 2018-10-02 MED FILL — predniSONE 5 MG TABS: 5 | 6 days supply | Qty: 21 | Fill #0

## 2018-10-02 MED FILL — AZITHROMYCIN 250 MG TABLET: 250 | 5 days supply | Qty: 6 | Fill #0

## 2018-10-04 ENCOUNTER — Ambulatory Visit (INDEPENDENT_AMBULATORY_CARE_PROVIDER_SITE_OTHER): Payer: No Typology Code available for payment source | Admitting: Family Medicine

## 2018-10-04 VITALS — BP 110/74 | HR 74 | Temp 98.8°F | Ht 65.0 in | Wt 184.0 lb

## 2018-10-04 DIAGNOSIS — K219 Gastro-esophageal reflux disease without esophagitis: Secondary | ICD-10-CM | POA: Diagnosis not present

## 2018-10-04 DIAGNOSIS — E559 Vitamin D deficiency, unspecified: Secondary | ICD-10-CM | POA: Diagnosis not present

## 2018-10-04 DIAGNOSIS — R634 Abnormal weight loss: Secondary | ICD-10-CM

## 2018-10-04 MED ORDER — SUCRALFATE 1 G PO TABS: 1.0000 g | ORAL_TABLET | Freq: Three times a day (TID) | ORAL | 0 refills | Status: DC

## 2018-10-04 MED ORDER — FAMOTIDINE 20 MG PO TABS: 20.0000 mg | ORAL_TABLET | Freq: Two times a day (BID) | ORAL | 0 refills | Status: DC

## 2018-10-04 MED ORDER — PANTOPRAZOLE SODIUM 40 MG PO TBEC: 40.0000 mg | DELAYED_RELEASE_TABLET | Freq: Every day | ORAL | 3 refills | Status: DC

## 2018-10-04 MED FILL — SUCRALFATE 1 GM TABLET: 1 | 7 days supply | Qty: 30 | Fill #0

## 2018-10-04 MED FILL — PANTOPRAZOLE SOD DR 40 MG T: 40 | 30 days supply | Qty: 30 | Fill #0

## 2018-10-05 ENCOUNTER — Encounter: Payer: Self-pay | Admitting: Family Medicine

## 2018-10-05 LAB — VITAMIN D 25 HYDROXY (VIT D DEFICIENCY, FRACTURES): VITD: 33.25 ng/mL (ref 30.00–100.00)

## 2018-10-05 NOTE — Progress Notes (Signed)
Veronica Raymond is a 33 y.o. female is here for follow up.  History of Present Illness:   Abdominal Cramping  This is a recurrent problem. The current episode started more than 1 month ago. The onset quality is gradual. The problem occurs intermittently. The problem has been waxing and waning. The pain is located in the generalized abdominal region and epigastric region. The pain is mild. The quality of the pain is colicky and aching. Associated symptoms include anorexia, constipation, nausea and weight loss. Pertinent negatives include no dysuria, fever, hematochezia, melena or vomiting. The pain is aggravated by eating. The pain is relieved by bowel movements and belching. She has tried proton pump inhibitors and acetaminophen for the symptoms. The treatment provided mild relief. Her past medical history is significant for GERD.   Depression screen [facility] 2/9 01/27/2018 03/28/2014  Decreased Interest 0 0  Down, Depressed, Hopeless 0 0  PHQ - 2 Score 0 0  Altered sleeping 0 -  Tired, decreased energy 0 -  Change in appetite 0 -  Feeling bad or failure about yourself  0 -  Trouble concentrating 0 -  Moving slowly or fidgety/restless 0 -  Suicidal thoughts 0 -  PHQ-9 Score 0 -  Difficult doing work/chores Not difficult at all -   PMHx, SurgHx, SocialHx, FamHx, Medications, and Allergies were reviewed in the Visit Navigator and updated as appropriate.   Patient Active Problem List   Diagnosis Date Noted  . History of abnormal cervical Pap smear 01/28/2018  . Cigar smoker 01/28/2018  . Allergic rhinitis   . GERD (gastroesophageal reflux disease)   . Migraine without aura, without mention of intractable migraine without mention of status migrainosus 07/09/2013   Social History   Tobacco Use  . Smoking status: Never Smoker  . Smokeless tobacco: Never Used  Substance Use Topics  . Alcohol use: Yes    Comment: 2/month  . Drug use: No   Current Medications and Allergies:   .   pantoprazole (PROTONIX) 40 MG tablet, Take 1 tablet (40 mg total) by mouth daily., Disp: 30 tablet, Rfl: 3 .  traZODone (DESYREL) 50 MG tablet, Take 0.5-1 tablets (25-50 mg total) by mouth at bedtime as needed for sleep., Disp: 30 tablet, Rfl: 3   Allergies  Allergen Reactions  . Decongestant [Pseudoephedrine] Other (See Comments)    SI   . Iodinated Diagnostic Agents Hives  . Percocet [Oxycodone-Acetaminophen] Itching   Review of Systems   Pertinent items are noted in the HPI. Otherwise, a complete ROS is negative.  Vitals:   Vitals:   10/04/18 1627  BP: 110/74  Pulse: 74  Temp: 98.8 F (37.1 C)  TempSrc: Oral  SpO2: 98%  Weight: 184 lb (83.5 kg)  Height: 5\' 5"  (1.651 m)     Body mass index is 30.62 kg/m.  Physical Exam:   Physical Exam Vitals signs and nursing note reviewed.  HENT:     Head: Normocephalic and atraumatic.  Eyes:     Pupils: Pupils are equal, round, and reactive to light.  Neck:     Musculoskeletal: Normal range of motion and neck supple.  Cardiovascular:     Rate and Rhythm: Normal rate and regular rhythm.     Heart sounds: Normal heart sounds.  Pulmonary:     Effort: Pulmonary effort is normal.  Abdominal:     Palpations: Abdomen is soft.  Skin:    General: Skin is warm.  Psychiatric:  Behavior: Behavior normal.     Assessment and Plan:   Radhika was seen today for abdominal cramping.  Diagnoses and all orders for this visit:  Weight loss -     Gliadin antibodies, serum -     Tissue transglutaminase, IgA -     Reticulin Antibody, IgA w reflex titer  Vitamin D deficiency -     VITAMIN D 25 Hydroxy (Vit-D Deficiency, Fractures)  Gastroesophageal reflux disease without esophagitis -     sucralfate (CARAFATE) 1 g tablet; Take 1 tablet (1 g total) by mouth 4 (four) times daily -  with meals and at bedtime. -     famotidine (PEPCID) 20 MG tablet; Take 1 tablet (20 mg total) by mouth 2 (two) times daily. -     pantoprazole  (PROTONIX) 40 MG tablet; Take 1 tablet (40 mg total) by mouth daily.   . Orders and follow up as documented in [facility], reviewed diet, exercise and weight control, cardiovascular risk and specific lipid/LDL goals reviewed, reviewed medications and side effects in detail.  . Reviewed expectations re: course of current medical issues. . Outlined signs and symptoms indicating need for more acute intervention. . Patient verbalized understanding and all questions were answered. . Patient received an After Visit Summary.   Briscoe Deutscher, DO [place], [facility] 10/05/2018

## 2018-10-06 LAB — TISSUE TRANSGLUTAMINASE, IGA: (tTG) Ab, IgA: 1 U/mL

## 2018-10-06 LAB — GLIADIN ANTIBODIES, SERUM
Gliadin IgA: 14 Units
Gliadin IgG: 2 Units

## 2018-10-06 LAB — RETICULIN ANTIBODIES, IGA W TITER: Reticulin IGA Screen: NEGATIVE

## 2018-10-09 ENCOUNTER — Telehealth: Payer: Self-pay

## 2018-10-09 NOTE — Telephone Encounter (Signed)
OTC Cimetidine okay.

## 2018-10-09 NOTE — Telephone Encounter (Signed)
Fax received from pharmacy Pepcid and zantac are both on back order.

## 2018-10-10 NOTE — Telephone Encounter (Signed)
Pt informed

## 2018-10-13 ENCOUNTER — Other Ambulatory Visit: Payer: Self-pay

## 2018-10-13 MED ORDER — BUSPIRONE HCL 5 MG PO TABS: 5.0000 mg | ORAL_TABLET | Freq: Three times a day (TID) | ORAL | 1 refills | Status: DC

## 2018-10-13 MED FILL — busPIRone HCL 5 MG TABS: 5 | 30 days supply | Qty: 90 | Fill #0

## 2018-11-06 ENCOUNTER — Other Ambulatory Visit: Payer: Self-pay

## 2018-11-06 DIAGNOSIS — K219 Gastro-esophageal reflux disease without esophagitis: Secondary | ICD-10-CM

## 2018-11-22 ENCOUNTER — Ambulatory Visit (INDEPENDENT_AMBULATORY_CARE_PROVIDER_SITE_OTHER): Payer: No Typology Code available for payment source | Admitting: Family Medicine

## 2018-11-22 ENCOUNTER — Encounter: Payer: Self-pay | Admitting: Family Medicine

## 2018-11-22 DIAGNOSIS — R238 Other skin changes: Secondary | ICD-10-CM | POA: Diagnosis not present

## 2018-11-22 NOTE — Progress Notes (Signed)
   Veronica Raymond is a 33 y.o. female here for an acute visit.  History of Present Illness:   Veronica Raymond, CMA acting as scribe for Dr. Briscoe Deutscher.   HPI: Patient has left ear pain that started two days ago. She has piercing that has not healed in that ear and is afraid it may be effecting it. Patient denies any sore throat, cough or fever.     PMHx, SurgHx, SocialHx, Medications, and Allergies were reviewed in the Visit Navigator and updated as appropriate.  Current Medications   Current Outpatient Medications:  .  busPIRone (BUSPAR) 5 MG tablet, Take 1 tablet (5 mg total) by mouth 3 (three) times daily., Disp: 90 tablet, Rfl: 1 .  sucralfate (CARAFATE) 1 g tablet, Take 1 tablet (1 g total) by mouth 4 (four) times daily -  with meals and at bedtime., Disp: 30 tablet, Rfl: 0 .  traZODone (DESYREL) 50 MG tablet, Take 0.5-1 tablets (25-50 mg total) by mouth at bedtime as needed for sleep., Disp: 30 tablet, Rfl: 3   Allergies  Allergen Reactions  . Decongestant [Pseudoephedrine] Other (See Comments)    SI   . Iodinated Diagnostic Agents Hives  . Percocet [Oxycodone-Acetaminophen] Itching   Review of Systems   Pertinent items are noted in the HPI. Otherwise, ROS is negative.  Physical Exam   Physical Exam HENT:     Head: Normocephalic and atraumatic.     Left Ear: Hearing, tympanic membrane and ear canal normal.     Ears:      Assessment and Plan   Veronica Raymond was seen today for ear pain.  Diagnoses and all orders for this visit:  Skin irritation Comments: Mild skin irritation without evidence of cellulitis.  Discussed skin care.  Advised bacitracin to the area twice daily.  Red flags reviewed.    . Reviewed expectations re: course of current medical issues. . Discussed self-management of symptoms. . Outlined signs and symptoms indicating need for more acute intervention. . Patient verbalized understanding and all questions were answered. Marland Kitchen Health Maintenance  issues including appropriate healthy diet, exercise, and smoking avoidance were discussed with patient. . See orders for this visit as documented in the electronic medical record. . Patient received an After Visit Summary.  CMA served as Education administrator during this visit. History, Physical, and Plan performed by medical provider. The above documentation has been reviewed and is accurate and complete. Briscoe Deutscher, D.O.  Briscoe Deutscher, DO [place], [facility] 11/23/2018

## 2018-11-23 ENCOUNTER — Encounter: Payer: Self-pay | Admitting: Family Medicine

## 2018-12-12 ENCOUNTER — Telehealth: Payer: Self-pay

## 2018-12-12 NOTE — Telephone Encounter (Signed)
Covid-19 travel screening questions  Have you traveled in the last 14 days? If yes where?  Do you now or have you had a fever in the last 14 days?  Do you have any respiratory symptoms of shortness of breath or cough now or in the last 14 days?  Do you have a medical history of Congestive Heart Failure? N/A  Do you have a medical history of lung disease? N/A  Do you have any family members or close contacts with diagnosed or suspected Covid-19?  Left message for  Pt to call back and reschedule her appt if she answers yes to any of the above questions.

## 2018-12-13 ENCOUNTER — Ambulatory Visit: Payer: No Typology Code available for payment source | Admitting: Gastroenterology

## 2018-12-20 ENCOUNTER — Other Ambulatory Visit: Payer: Self-pay

## 2018-12-20 ENCOUNTER — Ambulatory Visit (INDEPENDENT_AMBULATORY_CARE_PROVIDER_SITE_OTHER): Payer: No Typology Code available for payment source | Admitting: Family Medicine

## 2018-12-20 VITALS — HR 80 | Wt 182.0 lb

## 2018-12-20 DIAGNOSIS — R5383 Other fatigue: Secondary | ICD-10-CM

## 2018-12-20 DIAGNOSIS — H9319 Tinnitus, unspecified ear: Secondary | ICD-10-CM

## 2018-12-20 DIAGNOSIS — L02421 Furuncle of right axilla: Secondary | ICD-10-CM | POA: Diagnosis not present

## 2018-12-20 DIAGNOSIS — N644 Mastodynia: Secondary | ICD-10-CM | POA: Diagnosis not present

## 2018-12-20 DIAGNOSIS — R21 Rash and other nonspecific skin eruption: Secondary | ICD-10-CM | POA: Diagnosis not present

## 2018-12-20 DIAGNOSIS — R634 Abnormal weight loss: Secondary | ICD-10-CM

## 2018-12-20 MED ORDER — DOXYCYCLINE HYCLATE 100 MG PO TABS: 100.0000 mg | ORAL_TABLET | Freq: Two times a day (BID) | ORAL | 0 refills | Status: DC

## 2018-12-20 NOTE — Progress Notes (Signed)
Veronica Raymond is a 33 y.o. female is here for follow up.  Assessment and Plan:   Diagnoses and all orders for this visit:  Furuncle of right axilla -     doxycycline (VIBRA-TABS) 100 MG tablet; Take 1 tablet (100 mg total) by mouth 2 (two) times daily.  Weight loss -     CBC with Differential/Platelet -     Comprehensive metabolic panel -     Hemoglobin A1c -     hCG, quantitative, pregnancy  Rash Comments: Likely mechanical irritation +/- yeast, new bra. See orders.  Orders: -     CBC with Differential/Platelet -     Comprehensive metabolic panel -     Hemoglobin A1c -     triamcinolone (KENALOG) 0.025 % ointment; Apply 1 application topically 2 (two) times daily. -     nystatin ointment (MYCOSTATIN); Apply 1 application topically 2 (two) times daily.  Tinnitus, unspecified laterality  Nipple pain -     hCG, quantitative, pregnancy   Subjective:   HPI: Left helix pain, earring. See previous note, improved, but still with some ttp. No skin changes or drainage. Tinnitus. Endorses some bursts of pain from left ear, radiating to forehead and nose. Endorses intermittent tinnitus. Some runny nose and sinus pressure. No fever or cough. Nipple irritation, has had Essure. Lost 10 pounds since January, unintentional but had GI illness. Now maintaining weight. Had to change bra. Has noticed itchy rash between breasts. Some discomfort under right axilla. Sometimes with radiation of discomfort down right arm. No treatment.   Health Maintenance:  There are no preventive care reminders to display for this patient. Depression screen [facility] 2/9 01/27/2018 03/28/2014  Decreased Interest 0 0  Down, Depressed, Hopeless 0 0  PHQ - 2 Score 0 0  Altered sleeping 0 -  Tired, decreased energy 0 -  Change in appetite 0 -  Feeling bad or failure about yourself  0 -  Trouble concentrating 0 -  Moving slowly or fidgety/restless 0 -  Suicidal thoughts 0 -  PHQ-9 Score 0 -  Difficult doing  work/chores Not difficult at all -   PMHx, SurgHx, SocialHx, FamHx, Medications, and Allergies were reviewed in the Visit Navigator and updated as appropriate.   Patient Active Problem List   Diagnosis Date Noted  . History of abnormal cervical Pap smear 01/28/2018  . Cigar smoker 01/28/2018  . Allergic rhinitis   . GERD (gastroesophageal reflux disease)   . Migraine without aura, without mention of intractable migraine without mention of status migrainosus 07/09/2013   Social History   Tobacco Use  . Smoking status: Never Smoker  . Smokeless tobacco: Never Used  Substance Use Topics  . Alcohol use: Yes    Comment: 2/month  . Drug use: No   Current Medications and Allergies:   Current Outpatient Medications:  .  busPIRone (BUSPAR) 5 MG tablet, Take 1 tablet (5 mg total) by mouth 3 (three) times daily., Disp: 90 tablet, Rfl: 1 .  doxycycline (VIBRA-TABS) 100 MG tablet, Take 1 tablet (100 mg total) by mouth 2 (two) times daily., Disp: 20 tablet, Rfl: 0 .  nystatin ointment (MYCOSTATIN), Apply 1 application topically 2 (two) times daily., Disp: 30 g, Rfl: 0 .  sucralfate (CARAFATE) 1 g tablet, Take 1 tablet (1 g total) by mouth 4 (four) times daily -  with meals and at bedtime., Disp: 30 tablet, Rfl: 0 .  traZODone (DESYREL) 50 MG tablet, Take 0.5-1 tablets (25-50 mg total)  by mouth at bedtime as needed for sleep., Disp: 30 tablet, Rfl: 3 .  triamcinolone (KENALOG) 0.025 % ointment, Apply 1 application topically 2 (two) times daily., Disp: 30 g, Rfl: 0   Allergies  Allergen Reactions  . Decongestant [Pseudoephedrine] Other (See Comments)    SI   . Iodinated Diagnostic Agents Hives  . Percocet [Oxycodone-Acetaminophen] Itching   Review of Systems   Pertinent items are noted in the HPI. Otherwise, ROS is negative.  Vitals:   Vitals:   12/20/18 1609  Pulse: 80  SpO2: 100%  Weight: 182 lb (82.6 kg)     Body mass index is 30.29 kg/m.   Physical Exam:   General:  Cooperative, alert and oriented, well developed, well nourished, in no acute distress. HEENT: Pupils equal round reactive light and extraocular movements intact. Conjunctivae and lids unremarkable. No pallor or cyanosis, dentition good. Neck: No thyromegaly.  Cardiovascular: Regular rhythm. No murmurs appreciated.  Lungs: Normal work of breathing. Clear bilaterally without rales, rhonchi, or wheezing.  Abdomen: Soft, nontender, no masses. Normal bowel sounds. Extremities: No clubbing, cyanosis, erythema. No edema.  Skin: Warm and dry. Right axilla with palpable cyst, ttp, with no skin changes or streaking or lymphadenopathy. Neurologic: No focal deficits.  Psychiatric: Normal affect and thought content.  Breasts: skin irritation between breasts, slightly nodular, normal symmetric breasts without discharge.  . Reviewed expectations re: course of current medical issues. . Discussed self-management of symptoms. . Outlined signs and symptoms indicating need for more acute intervention. . Patient verbalized understanding and all questions were answered. Marland Kitchen Health Maintenance issues including appropriate healthy diet, exercise, and smoking avoidance were discussed with patient. . See orders for this visit as documented in the electronic medical record. . Patient received an After Visit Summary.  Briscoe Deutscher, DO [place], [facility] 12/21/2018

## 2018-12-21 ENCOUNTER — Encounter: Payer: Self-pay | Admitting: Family Medicine

## 2018-12-21 LAB — COMPREHENSIVE METABOLIC PANEL
ALT: 11 U/L (ref 0–35)
AST: 12 U/L (ref 0–37)
Albumin: 4.2 g/dL (ref 3.5–5.2)
Alkaline Phosphatase: 54 U/L (ref 39–117)
BUN: 13 mg/dL (ref 6–23)
CO2: 30 mEq/L (ref 19–32)
Calcium: 9.5 mg/dL (ref 8.4–10.5)
Chloride: 102 mEq/L (ref 96–112)
Creatinine, Ser: 0.81 mg/dL (ref 0.40–1.20)
GFR: 99.35 mL/min (ref >60.00)
Glucose, Bld: 92 mg/dL (ref 70–99)
Potassium: 3.5 mEq/L (ref 3.5–5.1)
Sodium: 139 mEq/L (ref 135–145)
Total Bilirubin: 0.4 mg/dL (ref 0.2–1.2)
Total Protein: 7.2 g/dL (ref 6.0–8.3)

## 2018-12-21 LAB — CBC WITH DIFFERENTIAL/PLATELET
Basophils Absolute: 0.1 10*3/uL (ref 0.0–0.1)
Basophils Relative: 1 % (ref 0.0–3.0)
Eosinophils Absolute: 0.1 10*3/uL (ref 0.0–0.7)
Eosinophils Relative: 1.7 % (ref 0.0–5.0)
HCT: 36.8 % (ref 36.0–46.0)
Hemoglobin: 12.4 g/dL (ref 12.0–15.0)
Lymphocytes Relative: 51.7 % — ABNORMAL HIGH (ref 12.0–46.0)
Lymphs Abs: 2.8 10*3/uL (ref 0.7–4.0)
MCHC: 33.8 g/dL (ref 30.0–36.0)
MCV: 91.6 fl (ref 78.0–100.0)
Monocytes Absolute: 0.4 10*3/uL (ref 0.1–1.0)
Monocytes Relative: 6.7 % (ref 3.0–12.0)
Neutro Abs: 2.1 10*3/uL (ref 1.4–7.7)
Neutrophils Relative %: 38.9 % — ABNORMAL LOW (ref 43.0–77.0)
Platelets: 340 10*3/uL (ref 150.0–400.0)
RBC: 4.01 Mil/uL (ref 3.87–5.11)
RDW: 12.5 % (ref 11.5–15.5)
WBC: 5.5 10*3/uL (ref 4.0–10.5)

## 2018-12-21 LAB — HCG, QUANTITATIVE, PREGNANCY: Quantitative HCG: <0.6 m[IU]/mL

## 2018-12-21 LAB — HEMOGLOBIN A1C: Hgb A1c MFr Bld: 5.6 % (ref 4.6–6.5)

## 2018-12-21 MED ORDER — NYSTATIN 100000 UNIT/GM EX OINT: 1.0000 "application " | TOPICAL_OINTMENT | Freq: Two times a day (BID) | CUTANEOUS | 0 refills | Status: DC

## 2018-12-21 MED ORDER — TRIAMCINOLONE ACETONIDE 0.025 % EX OINT: 1.0000 "application " | TOPICAL_OINTMENT | Freq: Two times a day (BID) | CUTANEOUS | 0 refills | Status: DC

## 2019-01-17 ENCOUNTER — Encounter: Payer: Self-pay | Admitting: Family Medicine

## 2019-01-18 ENCOUNTER — Other Ambulatory Visit: Payer: Self-pay

## 2019-01-18 DIAGNOSIS — N6011 Diffuse cystic mastopathy of right breast: Secondary | ICD-10-CM

## 2019-01-18 DIAGNOSIS — N6012 Diffuse cystic mastopathy of left breast: Secondary | ICD-10-CM

## 2019-01-18 DIAGNOSIS — J301 Allergic rhinitis due to pollen: Secondary | ICD-10-CM

## 2019-01-18 DIAGNOSIS — N644 Mastodynia: Secondary | ICD-10-CM

## 2019-01-22 ENCOUNTER — Ambulatory Visit
Admission: RE | Admit: 2019-01-22 | Discharge: 2019-01-22 | Disposition: A | Discharge status: Home / Self Care | Payer: No Typology Code available for payment source | Source: Ambulatory Visit | Attending: Family Medicine | Admitting: Family Medicine

## 2019-01-22 ENCOUNTER — Other Ambulatory Visit: Payer: Self-pay

## 2019-01-22 DIAGNOSIS — N644 Mastodynia: Secondary | ICD-10-CM

## 2019-02-28 ENCOUNTER — Telehealth: Payer: Self-pay | Admitting: Family Medicine

## 2019-02-28 NOTE — Telephone Encounter (Signed)
Please advise on transfer request  Copied from [place] [phone]. Topic: Appointment Scheduling - Transfer of Care >> Feb 28, 2019 11:05 AM Rayann Heman wrote: Pt is requesting to transfer FROM: [facility]  Pt is requesting to transfer TO: [place]  Reason for requested transfer: because of where she work.   Send CRM to patient's current PCP (transferring FROM).

## 2019-02-28 NOTE — Telephone Encounter (Signed)
It is fine with me. Thanks, BJ 

## 2019-02-28 NOTE — Telephone Encounter (Signed)
Okay with me 

## 2019-03-01 NOTE — Telephone Encounter (Signed)
Please schedule TOC for virtual visit.

## 2019-03-01 NOTE — Telephone Encounter (Signed)
Please advise on scheduling TOC appointment for patient to Dr. [place] Providers have approved.

## 2019-03-02 ENCOUNTER — Other Ambulatory Visit: Payer: Self-pay

## 2019-03-02 ENCOUNTER — Encounter: Payer: Self-pay | Admitting: Family Medicine

## 2019-03-02 ENCOUNTER — Ambulatory Visit (INDEPENDENT_AMBULATORY_CARE_PROVIDER_SITE_OTHER): Payer: No Typology Code available for payment source | Admitting: Family Medicine

## 2019-03-02 VITALS — BP 114/74 | HR 97 | Temp 98.5°F | Ht 65.0 in | Wt 185.4 lb

## 2019-03-02 DIAGNOSIS — R519 Headache, unspecified: Secondary | ICD-10-CM

## 2019-03-02 DIAGNOSIS — R51 Headache: Secondary | ICD-10-CM | POA: Diagnosis not present

## 2019-03-02 DIAGNOSIS — R5383 Other fatigue: Secondary | ICD-10-CM | POA: Diagnosis not present

## 2019-03-02 DIAGNOSIS — M2550 Pain in unspecified joint: Secondary | ICD-10-CM | POA: Diagnosis not present

## 2019-03-02 NOTE — Progress Notes (Signed)
Phone [phone]   Subjective:  Veronica Raymond is a 33 y.o. year old very pleasant female patient who presents for/with See problem oriented charting Chief Complaint  Patient presents with  . Altered Mental Status  . Spasms   ROS- dizziness, confused periods, headaches, left hearing loss noted, diffuse arthralgias noted.    Past Medical History-  Patient Active Problem List   Diagnosis Date Noted  . Migraine without aura, without mention of intractable migraine without mention of status migrainosus 07/09/2013    Priority: Medium  . Allergic rhinitis     Priority: Low  . GERD (gastroesophageal reflux disease)     Priority: Low  . History of abnormal cervical Pap smear 01/28/2018    Medications- reviewed and updated Current Outpatient Medications  Medication Sig Dispense Refill  . busPIRone (BUSPAR) 5 MG tablet Take 1 tablet (5 mg total) by mouth 3 (three) times daily. (Patient not taking: Reported on 03/02/2019) 90 tablet 1  . traZODone (DESYREL) 50 MG tablet Take 0.5-1 tablets (25-50 mg total) by mouth at bedtime as needed for sleep. (Patient not taking: Reported on 03/02/2019) 30 tablet 3   No current facility-administered medications for this visit.      Objective:  BP 114/74 (BP Location: Left Arm, Patient Position: Sitting, Cuff Size: Large)   Pulse 97   Temp 98.5 F (36.9 C) (Oral)   Ht '5\' 5"'  (1.651 m)   Wt 185 lb 6.1 oz (84.1 kg)   LMP 02/04/2019   SpO2 100%   BMI 30.85 kg/m  Gen: NAD, resting comfortably CV: RRR no murmurs rubs or gallops Lungs: CTAB no crackles, wheeze, rhonchi Abdomen: soft/nontender/nondistended/normal bowel sounds. obeseExt: no edema Skin: warm, dry, no obvious rash Neuro: CN II-XII intact  With exception of hearing loss left ear, sensation and reflexes normal throughout, 5/5 muscle strength in bilateral upper and lower extremities. Normal finger to nose. Normal rapid alternating movements. No pronator drift. Normal romberg. Normal gait.      Assessment and Plan   Fatigue, unspecified type - Plan: Vitamin B12, C-reactive protein, Sedimentation rate, TSH, Comprehensive metabolic panel, CBC, Iron, TIBC and Ferritin Panel, CBC, Comprehensive metabolic panel, TSH, Sedimentation rate, C-reactive protein, Vitamin B12, CANCELED: CBC, CANCELED: Comprehensive metabolic panel, CANCELED: TSH, CANCELED: IBC + Ferritin, CANCELED: Sedimentation rate, CANCELED: C-reactive protein, CANCELED: Vitamin B12, CANCELED: Iron, TIBC and Ferritin Panel  Frequent headaches - Plan: C-reactive protein, Sedimentation rate, Sedimentation rate, C-reactive protein, CANCELED: Sedimentation rate, CANCELED: C-reactive protein  Diffuse arthralgia - Plan: Antinuclear Antib (ANA), Rheumatoid Factor, Rheumatoid Factor, Antinuclear Antib (ANA) S:  Patient states she has been having significant fatigue. Fatigue going on since December. Has talked to Dr. Juleen China in past about fatigue in the past- also mentioned joint aches- she reports every joint in her body hurts. Diffuse joint aches have been since age 56- has felt worse recently. Knees feel stiff in am but otherwise no morning stiffness.  No chest pain or shortness of breath. Getting a lot of twitching in her muscles since December. Sometimes the twitching will be really strong. Patient has had bloodwork for fatigue in the past. She was concerned about  Vitamin d but Vitamin D was ok around 50. Fatigue is mainly reported as sleepiness- if she sleeps she does feel better.    Feels like she has intermittent confusion episodes starting in february- can be sitting at home and all of the sudden feels slightly dizzy and then she feels disconnected from reality but no hallucinations. Stutters more during  those episodes but has also felt like she is stuttering some even not during episodes. Episodes last about 5 minutes- about 3x a week. Feels normal after a day after episodes. I work with patient and have not noted any stuttering  with me or with patients. For about a month has had memory issues. Feels like she is having some memory issues outside disconnected feeling periods. Her son can tell her something and she will completely forget everything he said at times. Her kids and boyfriend have noticed.    Even on a good weekend day where things are relaxed can still have episodes- doesn't feel like symptoms worsen with more stress. Dad is deathly ill btu they are not close so not weighing on her heavily. Asked about alcohol intake due to fathers history of alcoholism- She drinks about 3 shots every other weekend. Prior history of nausea/vomiting/weight loss but has had extensive eval including ct abd/pelvis thoughs he does not tolerate IV contrast due to hives. She was having a lot of reflux but right now -not taking anything- watching diet and seems to help. Prior weight loss has stablized- had worked with DR. Juleen China and even GI on this. Celiac test done in January due to not eating well- that was negative. Prior nausea/vomiting- now better. Ultimately was related to anxiety  Other symptoms include 1. Has been getting a lot of frontal headaches- feels like a heaviness. Pain level 3/10 but can get up to 7/10- about 5 x a week and last about 5 hours to a day. Doesn't like taking medicine- does ice or heat. Has a history of migraines- but has not had one in 4 years- had seen Dr. Jannifer Franklin in the past. She doesn't remember him well. States she doesn't like to take medicine.  2. Palpitations-2x day where heart races but otherwise feels well. HR high normal today 3. Snores. Well rested after she sleeps. Goes home and goes asleep immediately- sleeping 9.5 hrs. Feels like needs to go to bed.  4. Gets numbness tingling in right arm- for last week- and right foot- for 2 days. Comes on for about 10 minutes then goes away. Episodes only happen when laying down of numbness/tingling.  A/P: 33 year old female with extensive symptomatology including  fatigue for 6 months, memory loss periods over last month, 3 episodes of week of feeling detached from reality and confusion, diffuse arthralgias since 23 but recently worsening without obvious synoviits, worsening headache pattern, palpitations, daytime sleepiness, right arm possible radiculopathy - Patient is transitioning to new PCP Dr. [place] next week - we will start with b12, tsh, cbc, cmp, ferritin ,esr, crp, ana, rf. I doubt rheumatoid arthritis. b12 and tsh came back normal. No anemia or iron deficiency or electrolyte or kidney or liver issues noted. ESR and CRP normal.  RF negative. ANA pending.  - discussed possibility of MS (though I do not strongly suspect) - discussed possibility of sleep apnea contributing to fatigue (if labs normal consider sleep study) - discussed consideration MRI brain (I considered ordering this but wanted to consider neuro visit first since she has contrast allergy to see if they think noncontrast MRI adequate of brain- or would she need other imaging)  -with left hearing loss I advised her to return to ENT she has seen previously (before they removed wax but she has no wax today) and hearing was never tested  - this also makes me lean toward MRI brain - ultimately we decided to get labs first  and she will discuss with Dr. [place] next week  (I will message Dr. [place] and happy to chat with her by phone if desired) - consider neuro referral as above - we also discussed possibility that anxiety/stress was a trigger for above symptoms- especially given how profound her GI symptoms were when related to anxiety previously.   Future Appointments  Date Time Provider [place]  03/07/2019  2:15 PM [place], Betty G, MD [id number]   Lab/Order associations: Fatigue, unspecified type - Plan: Vitamin B12, C-reactive protein, Sedimentation rate, TSH, Comprehensive metabolic panel, CBC, Iron, TIBC and Ferritin Panel, CBC, Comprehensive metabolic panel, TSH,  Sedimentation rate, C-reactive protein, Vitamin B12, CANCELED: CBC, CANCELED: Comprehensive metabolic panel, CANCELED: TSH, CANCELED: IBC + Ferritin, CANCELED: Sedimentation rate, CANCELED: C-reactive protein, CANCELED: Vitamin B12, CANCELED: Iron, TIBC and Ferritin Panel  Frequent headaches - Plan: C-reactive protein, Sedimentation rate, Sedimentation rate, C-reactive protein, CANCELED: Sedimentation rate, CANCELED: C-reactive protein  Diffuse arthralgia - Plan: Antinuclear Antib (ANA), Rheumatoid Factor, Rheumatoid Factor, Antinuclear Antib (ANA)  No orders of the defined types were placed in this encounter.  Time Stamp The duration of face-to-face time during this visit was greater than 25 minutes. Greater than 50% of this time was spent in counseling, explanation of diagnosis, planning of further management, and/or coordination of care including discussion of possible causes, discussion of potential workup.   Return precautions advised.  Garret Reddish, MD

## 2019-03-02 NOTE — Patient Instructions (Addendum)
Please stop by lab before you go If you do not have mychart- we will call you about results within 5 business days of [name] receiving them.  If you have mychart- we will send your results within 3 business days of [name] receiving them.  If abnormal or we want to clarify a result, we will call or mychart you to make sure you receive the message.  If you have questions or concerns or don't hear within 5-7 days, please send [name] a message or call us.   Considering the following 1. MRI 2. Neurology exam 3. Sleep apnea testing 4. Can discuss options with Dr. [place] as well- im happy to chat with her

## 2019-03-03 ENCOUNTER — Encounter: Payer: Self-pay | Admitting: Family Medicine

## 2019-03-06 LAB — CBC
HCT: 37.6 % (ref 35.0–45.0)
Hemoglobin: 12.7 g/dL (ref 11.7–15.5)
MCH: 30.5 pg (ref 27.0–33.0)
MCHC: 33.8 g/dL (ref 32.0–36.0)
MCV: 90.2 fL (ref 80.0–100.0)
MPV: 9.3 fL (ref 7.5–12.5)
Platelets: 352 10*3/uL (ref 140–400)
RBC: 4.17 10*6/uL (ref 3.80–5.10)
RDW: 12.1 % (ref 11.0–15.0)
WBC: 6.7 10*3/uL (ref 3.8–10.8)

## 2019-03-06 LAB — COMPREHENSIVE METABOLIC PANEL
AG Ratio: 1.2 (calc) (ref 1.0–2.5)
ALT: 9 U/L (ref 6–29)
AST: 11 U/L (ref 10–30)
Albumin: 4.1 g/dL (ref 3.6–5.1)
Alkaline phosphatase (APISO): 52 U/L (ref 31–125)
BUN: 15 mg/dL (ref 7–25)
CO2: 23 mmol/L (ref 20–32)
Calcium: 9.4 mg/dL (ref 8.6–10.2)
Chloride: 103 mmol/L (ref 98–110)
Creat: 0.88 mg/dL (ref 0.50–1.10)
Globulin: 3.3 g/dL (calc) (ref 1.9–3.7)
Glucose, Bld: 86 mg/dL (ref 65–99)
Potassium: 3.9 mmol/L (ref 3.5–5.3)
Sodium: 139 mmol/L (ref 135–146)
Total Bilirubin: 0.4 mg/dL (ref 0.2–1.2)
Total Protein: 7.4 g/dL (ref 6.1–8.1)

## 2019-03-06 LAB — C-REACTIVE PROTEIN: CRP: 3.5 mg/L (ref <8.0)

## 2019-03-06 LAB — ANTI-NUCLEAR AB-TITER (ANA TITER): ANA Titer 1: 1:160 {titer} — ABNORMAL HIGH

## 2019-03-06 LAB — RHEUMATOID FACTOR: Rheumatoid fact SerPl-aCnc: <14 IU/mL (ref <14)

## 2019-03-06 LAB — IRON,TIBC AND FERRITIN PANEL
%SAT: 30 % (calc) (ref 16–45)
Ferritin: 35 ng/mL (ref 16–154)
Iron: 112 ug/dL (ref 40–190)
TIBC: 378 mcg/dL (calc) (ref 250–450)

## 2019-03-06 LAB — SEDIMENTATION RATE: Sed Rate: 17 mm/h (ref 0–20)

## 2019-03-06 LAB — TSH: TSH: 1.1 mIU/L

## 2019-03-06 LAB — ANA: Anti Nuclear Antibody (ANA): POSITIVE — AB

## 2019-03-06 LAB — VITAMIN B12: Vitamin B-12: 493 pg/mL (ref 200–1100)

## 2019-03-07 ENCOUNTER — Other Ambulatory Visit: Payer: Self-pay

## 2019-03-07 ENCOUNTER — Ambulatory Visit (INDEPENDENT_AMBULATORY_CARE_PROVIDER_SITE_OTHER): Payer: No Typology Code available for payment source | Admitting: Family Medicine

## 2019-03-07 ENCOUNTER — Encounter: Payer: Self-pay | Admitting: *Deleted

## 2019-03-07 ENCOUNTER — Encounter: Payer: Self-pay | Admitting: Family Medicine

## 2019-03-07 DIAGNOSIS — K219 Gastro-esophageal reflux disease without esophagitis: Secondary | ICD-10-CM

## 2019-03-07 DIAGNOSIS — R768 Other specified abnormal immunological findings in serum: Secondary | ICD-10-CM

## 2019-03-07 DIAGNOSIS — R42 Dizziness and giddiness: Secondary | ICD-10-CM | POA: Diagnosis not present

## 2019-03-07 DIAGNOSIS — R200 Anesthesia of skin: Secondary | ICD-10-CM

## 2019-03-07 DIAGNOSIS — M2550 Pain in unspecified joint: Secondary | ICD-10-CM | POA: Insufficient documentation

## 2019-03-07 DIAGNOSIS — R5383 Other fatigue: Secondary | ICD-10-CM

## 2019-03-07 MED ORDER — PANTOPRAZOLE SODIUM 40 MG PO TBEC: 40.0000 mg | DELAYED_RELEASE_TABLET | Freq: Every day | ORAL | 1 refills | Status: DC

## 2019-03-07 NOTE — Assessment & Plan Note (Signed)
Symptomatic. Protonix has helped in the past,recommended resuming 40 mg daily x 3-4 weeks then we can try to decrease dose to 20 mg. GERD precautions.

## 2019-03-07 NOTE — Progress Notes (Signed)
Virtual Visit via Video Note   I connected with Ms Mccammon on 03/07/19 at  2:15 PM EDT by a video enabled telemedicine application and verified that I am speaking with the correct person using two identifiers.  Location patient: home Location provider:home office Persons participating in the virtual visit: patient, provider  I discussed the limitations of evaluation and management by telemedicine and the availability of in person appointments. The patient expressed understanding and agreed to proceed.   HPI: Ms Wendell is a 33 yo female with Hx of migraine,GERD,and allergies among some who is establishing care today. Former PCP: Dr Juleen China. Recently seen by Dr Irineo Axon concerns discussed and blood work done. Arthralgias and getting worse. She has had problem since age 39. She has no noted joint edema or erythema. Negative for fever,chills,oral lesions,or skin rash.  "Every joint" hurts and myalgias, "every muscle in my body.". Reporting "twitching" muscles sensation,intermittently since 08/2018. She has not identified exacerbating or alleviating symptom. Recent rheumatologic work up otherwise normal except for ANA. Maternal aunt Hx of lupus.  Fatigue for a few months. She sleeps well,goes to bed around 8:30 pm to 6 am. She feels rested when she first gets and around 2 pm she starts with fatigue.  Forgetfulness ,intermittent. Problem has not affected performance at work but she is afraid of making mistakes. When inquired about anxiety or depression she denies but states that her mother and boyfriend have mention that she has mood swings.  She is requesting a note for work for a few days, states that for the past 2 weeks she has had episodes of dizziness following by "confusion." According to pt,it is associated with urine incontinence. No known Hx of seizures. She has not identified exacerbating factors. Episodes last up to 5 min.  Intermittent right-sided numbness, from  arm to right foot. No associated focal deficit.  Headaches are more frequent,daily frontal headache, 3/10,2 times per week it is 10/10. Negative for vomiting and photophobia.  GERD: Symptoms were well controlled with dietary measures. Early this year she was concerned about unintended.wt loss,so she started eating "everything" in order to gain wt,so heartburn started back,it is "bad." Negative for dysphagia. She has tried OTC Tums. Negative for abdominal pain,vomiting,melena, or changes in bowel habits. + "A lot of nausea."   ROS: See pertinent positives and negatives per HPI.  Past Medical History:  Diagnosis Date  . Allergic rhinitis   . Bartholin gland cyst 05/04/2013  . GERD (gastroesophageal reflux disease)   . Migraine without aura, without mention of intractable migraine without mention of status migrainosus 07/09/2013  . Ovarian cyst 2014  . Vaginal Pap smear, abnormal     Past Surgical History:  Procedure Laterality Date  . ADENOIDECTOMY    . ESSURE TUBAL LIGATION  07-05-2014  . THERAPEUTIC ABORTION    . TONSILLECTOMY    . TYMPANOSTOMY TUBE PLACEMENT      Family History  Problem Relation Age of Onset  . Hypertension Mother   . Heart disease Father   . Alcohol abuse Father        dying from this  . Diabetes Father   . Mental retardation Maternal Uncle   . Diabetes Maternal Grandmother   . Hypertension Maternal Grandmother   . Kidney disease Maternal Grandmother   . Heart disease Maternal Grandmother   . Stroke Maternal Grandmother   . Prostate cancer Maternal Grandfather   . Alcohol abuse Other   . Hypertension Paternal Grandmother     Social History  Socioeconomic History  . Marital status: Single    Spouse name: Not on file  . Number of children: 3  . Years of education: college  . Highest education level: Not on file  Occupational History  . 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  Financial resource strain: Not on file  . Food insecurity:    Worry: Not on file    Inability: Not on file  . Transportation needs:    Medical: Not on file    Non-medical: Not on file  Tobacco Use  . Smoking status: Never Smoker  . Smokeless tobacco: Never Used  Substance and Sexual Activity  . Alcohol use: Yes    Comment: 2/month  . Drug use: No  . Sexual activity: Yes    Birth control/protection: Surgical  Lifestyle  . Physical activity:    Days per week: Not on file    Minutes per session: Not on file  . Stress: Not on file  Relationships  . Social connections:    Talks on phone: Not on file    Gets together: Not on file    Attends religious service: Not on file    Active member of club or organization: Not on file    Attends meetings of clubs or organizations: Not on file    Relationship status: Not on file  . Intimate partner violence:    Fear of current or ex partner: Not on file    Emotionally abused: Not on file    Physically abused: Not on file    Forced sexual activity: Not on file  Other Topics Concern  . Not on file  Social History Narrative  . Not on file      Current Outpatient Medications:  .  busPIRone (BUSPAR) 5 MG tablet, Take 1 tablet (5 mg total) by mouth 3 (three) times daily. (Patient not taking: Reported on 03/02/2019), Disp: 90 tablet, Rfl: 1 .  pantoprazole (PROTONIX) 40 MG tablet, Take 1 tablet (40 mg total) by mouth daily., Disp: 30 tablet, Rfl: 1 .  traZODone (DESYREL) 50 MG tablet, Take 0.5-1 tablets (25-50 mg total) by mouth at bedtime as needed for sleep. (Patient not taking: Reported on 03/02/2019), Disp: 30 tablet, Rfl: 3  EXAM:  VITALS per patient if applicable:N/A  GENERAL: alert, oriented, appears well and in no acute distress  HEENT: atraumatic, conjunctiva clear, no obvious facial abnormalities on inspection.  NECK: normal movements of the head and neck  LUNGS: on inspection no signs of respiratory distress, breathing rate  appears normal, no obvious gross SOB, gasping or wheezing  CV: no obvious cyanosis  MS: moves all visible extremities without noticeable abnormality. No signs of synovitis.  PSYCH/NEURO: pleasant and cooperative, no obvious depression,+ anxious.Speech and thought processing grossly intact  ASSESSMENT AND PLAN:  Discussed the following assessment and plan:  Positive ANA (antinuclear antibody) - Plan: Ambulatory referral to Rheumatology Explained that this can be normal in a percentage of population w/o rheumatologic disorder. Because associated arthralgias rheuma evaluation recommended.  Numbness - Plan: Ambulatory referral to Neurology No focal deficit. Because a few other symptoms are present + worsening headaches, neuro referral placed to evaluate for MS or other neurologic process. She has Hx of contrast allergy,so will hold on brain MRI. Instructed about warning signs.  Dizziness Seizure disorder to consider given the association with "confusion" and urine incontinence. Letter for work given, 2 weeks. Recommend avoiding driving until  she sees neuro. Instructed about warning signs.  Other fatigue Possible etiologies discussed.  + Myalgias and polyarthralgia fibromyalgia needs to be considered.  GERD (gastroesophageal reflux disease) Symptomatic. Protonix has helped in the past,recommended resuming 40 mg daily x 3-4 weeks then we can try to decrease dose to 20 mg. GERD precautions.  Polyarthralgia Chronic. Possible etiologies discussed. Wt loss may help. Rheuma referral placed.     I discussed the assessment and treatment plan with the patient. She was provided an opportunity to ask questions and all were answered. The patient agreed with the plan and demonstrated an understanding of the instructions.   The patient was advised to call back or seek an in-person evaluation if the symptoms worsen or if the condition fails to improve as anticipated.  Return if symptoms  worsen or fail to improve, for after rheuma evaluation.    Betty Martinique, MD

## 2019-03-07 NOTE — Assessment & Plan Note (Signed)
Chronic. Possible etiologies discussed. Wt loss may help. Rheuma referral placed.

## 2019-03-15 ENCOUNTER — Encounter: Payer: Self-pay | Admitting: Neurology

## 2019-03-16 ENCOUNTER — Telehealth: Payer: No Typology Code available for payment source | Admitting: Neurology

## 2019-03-16 ENCOUNTER — Other Ambulatory Visit: Payer: Self-pay

## 2019-03-18 NOTE — Progress Notes (Signed)
No show

## 2019-03-19 ENCOUNTER — Telehealth: Payer: Self-pay | Admitting: Family Medicine

## 2019-03-19 NOTE — Telephone Encounter (Signed)
Pt states she if calling to f/up on FMLA paperwork from [employer].  Pt states [employer] has not yet received paperwork and it is due by 03/27/2019. Pt would like to note that date(s) of leave should reflect that she needs to be out through 03/28/2019 because that is when her appt is scheduled with Neurology to rule out seizure activity.  Please update/complete paperwork and contact/update pt.    Pt  [phone]

## 2019-03-19 NOTE — Telephone Encounter (Signed)
I already completed FMLA form and signed it. Thanks, BJ

## 2019-03-20 NOTE — Telephone Encounter (Signed)
Left a detailed message on verified voice mail informing the patient that her forms are up front for pick up

## 2019-03-23 ENCOUNTER — Encounter: Payer: Self-pay | Admitting: Family Medicine

## 2019-03-27 ENCOUNTER — Encounter: Payer: Self-pay | Admitting: Neurology

## 2019-03-28 ENCOUNTER — Other Ambulatory Visit: Payer: Self-pay

## 2019-03-28 ENCOUNTER — Telehealth (INDEPENDENT_AMBULATORY_CARE_PROVIDER_SITE_OTHER): Payer: No Typology Code available for payment source | Admitting: Neurology

## 2019-03-28 VITALS — Ht 65.0 in | Wt 185.0 lb

## 2019-03-28 DIAGNOSIS — R42 Dizziness and giddiness: Secondary | ICD-10-CM

## 2019-03-28 DIAGNOSIS — H81399 Other peripheral vertigo, unspecified ear: Secondary | ICD-10-CM

## 2019-03-28 DIAGNOSIS — R202 Paresthesia of skin: Secondary | ICD-10-CM | POA: Diagnosis not present

## 2019-03-28 NOTE — Addendum Note (Signed)
Addended by: Amado Coe on: 03/28/2019 10:04 AM   Modules accepted: Orders

## 2019-03-28 NOTE — Progress Notes (Signed)
New Patient Virtual Visit via Video Note The purpose of this virtual visit is to provide medical care while limiting exposure to the novel coronavirus.    Consent was obtained for video visit:  Yes.   Answered questions that patient had about telehealth interaction:  Yes.   I discussed the limitations, risks, security and privacy concerns of performing an evaluation and management service by telemedicine. I also discussed with the patient that there may be a patient responsible charge related to this service. The patient expressed understanding and agreed to proceed.  Pt location: Home Physician Location: office Name of referring provider:  Briscoe Deutscher, DO I connected with Veronica Raymond at patients initiation/request on 03/28/2019 at  9:10 AM EDT by video enabled telemedicine application and verified that I am speaking with the correct person using two identifiers. Pt MRN:  [id number] Pt DOB:  01/08/1986 Video Participants:  Veronica Raymond    History of Present Illness: Veronica Raymond is a 33 y.o. right-handed female with GERD, anxiety, and migraines presenting for evaluation of paresthesias.   Starting in May 2020, she began experiencing numbness over the right arm and leg.  It occurs every other day, lasting about 5 minutes.  There is no identifiable trigger and symptoms self resolve.  This can occur with exertion or at rest she has mild weakness during the spells.  She is awake, there is no abnormal movements.  She also complains of involuntary muscle twitches described as shoulder twitch, dizziness, confusion/fogginess.  She was diagnosed with anxiety earlier this year and was given prescription for BuSpar, however has not taken this due to concerns of side effects.  She has a history of migraines, which is very well controlled with lifestyle modification.  Out-side paper records, electronic medical record, and images have been reviewed where available and summarized as:  Lab  Results  Component Value Date   HGBA1C 5.6 12/20/2018   Lab Results  Component Value Date   XHBZJIRC78 938 03/02/2019   Lab Results  Component Value Date   TSH 1.10 03/02/2019   Lab Results  Component Value Date   ESRSEDRATE 17 03/02/2019    Past Medical History:  Diagnosis Date  . Allergic rhinitis   . Bartholin gland cyst 05/04/2013  . GERD (gastroesophageal reflux disease)   . Migraine without aura, without mention of intractable migraine without mention of status migrainosus 07/09/2013  . Ovarian cyst 2014  . Vaginal Pap smear, abnormal     Past Surgical History:  Procedure Laterality Date  . ADENOIDECTOMY    . ESSURE TUBAL LIGATION  07-05-2014  . THERAPEUTIC ABORTION    . TONSILLECTOMY    . TYMPANOSTOMY TUBE PLACEMENT       Medications:  Outpatient Encounter Medications as of 03/28/2019  Medication Sig  . pantoprazole (PROTONIX) 40 MG tablet Take 1 tablet (40 mg total) by mouth daily.   No facility-administered encounter medications on file as of 03/28/2019.     Allergies:  Allergies  Allergen Reactions  . Decongestant [Pseudoephedrine] Other (See Comments)    SI   . Iodinated Diagnostic Agents Hives  . Percocet [Oxycodone-Acetaminophen] Itching    Family History: Family History  Problem Relation Age of Onset  . Hypertension Mother   . Heart disease Father   . Alcohol abuse Father        dying from this  . Diabetes Father   . Mental retardation Maternal Uncle   . Diabetes Maternal Grandmother   . Hypertension Maternal  Grandmother   . Kidney disease Maternal Grandmother   . Heart disease Maternal Grandmother   . Stroke Maternal Grandmother   . Prostate cancer Maternal Grandfather   . Alcohol abuse Other   . Hypertension Paternal Grandmother     Social History: Social History   Tobacco Use  . Smoking status: Never Smoker  . Smokeless tobacco: Never Used  Substance Use Topics  . Alcohol use: Yes    Comment: 2/month  . Drug use: No    Social History   Social History Narrative   Lives with her 4 children      Lives in two story home      Right handed      Highest level of edu- [facility]       Review of Systems:  CONSTITUTIONAL: No fevers, chills, night sweats, or weight loss.   EYES: No visual changes or eye pain ENT: No hearing changes.  No history of nose bleeds.   RESPIRATORY: No cough, wheezing and shortness of breath.   CARDIOVASCULAR: Negative for chest pain, and palpitations.   GI: Negative for abdominal discomfort, blood in stools or black stools.  No recent change in bowel habits.   GU:  No history of incontinence.   MUSCLOSKELETAL: No history of joint pain or swelling.  No myalgias.   SKIN: Negative for lesions, rash, and itching.   HEMATOLOGY/ONCOLOGY: Negative for prolonged bleeding, bruising easily, and swollen nodes.  No history of cancer.   ENDOCRINE: Negative for cold or heat intolerance, polydipsia or goiter.   PSYCH:  No depression +anxiety symptoms.   NEURO: As Above.   Vital Signs:  Ht 5\' 5"  (1.651 m)   Wt 185 lb (83.9 kg)   BMI 30.79 kg/m    General Medical Exam:  Well appearing, comfortable.  Nonlabored breathing.  No deformity or edema.  No rash.  Neurological Exam: MENTAL STATUS including orientation to time, place, person, recent and remote memory, attention span and concentration, language, and fund of knowledge is normal.  Speech is not dysarthric.  CRANIAL NERVES:  Normal conjugate, extra-ocular eye movements in all directions of gaze.  No ptosis.  Normal facial symmetry and movements.  Normal shoulder shrug and head rotation.  Tongue is midline.  MOTOR:  Antigravity in all extremities.  No abnormal movements.  No pronator drift.   SENSORY & REFLEXES: Unable to assess.   COORDINATION/GAIT: Normal finger to nose bilaterally.  Intact rapid alternating movements bilaterally.  Gait narrow based and stable. Tandem and stressed gait intact.    IMPRESSION: Multitude of  nonspecific symptoms including right-sided paresthesias, dizziness, and confusion.  Symptoms are all transient without any specific triggers.  To be complete, I will obtain MRI brain without contrast.  My overall suspicion for anything worrisome, such as multiple sclerosis, is low.  If imaging is normal, I recommend that she follow-up with her PCP to address underlying anxiety/stress which can manifest with somatic complaints.     Follow Up Instructions:  I discussed the assessment and treatment plan with the patient. The patient was provided an opportunity to ask questions and all were answered. The patient agreed with the plan and demonstrated an understanding of the instructions.   The patient was advised to call back or seek an in-person evaluation if the symptoms worsen or if the condition fails to improve as anticipated.  Total Time spent:  45 min   Alda Berthold, DO

## 2019-04-02 ENCOUNTER — Encounter: Payer: Self-pay | Admitting: Family Medicine

## 2019-04-04 ENCOUNTER — Encounter: Payer: Self-pay | Admitting: Family Medicine

## 2019-04-04 ENCOUNTER — Ambulatory Visit (INDEPENDENT_AMBULATORY_CARE_PROVIDER_SITE_OTHER): Payer: No Typology Code available for payment source | Admitting: Family Medicine

## 2019-04-04 ENCOUNTER — Other Ambulatory Visit: Payer: Self-pay

## 2019-04-04 DIAGNOSIS — B370 Candidal stomatitis: Secondary | ICD-10-CM

## 2019-04-04 MED ORDER — NYSTATIN 100000 UNIT/ML MT SUSP: 5.0000 mL | Freq: Four times a day (QID) | OROMUCOSAL | 0 refills | Status: AC

## 2019-04-04 NOTE — Progress Notes (Signed)
Virtual Visit via Video Note   I connected with Ms Oats on 04/07/19 at  2:30 PM EDT by a video enabled telemedicine application and verified that I am speaking with the correct person using two identifiers.  Location patient: home Location provider:home office Persons participating in the virtual visit: patient, provider  I discussed the limitations of evaluation and management by telemedicine and the availability of in person appointments. The patient expressed understanding and agreed to proceed.  Chief Complaint  Patient presents with  . White patches on tongue    HPI: Ms Bivens is a 33 yo c/o above concerned. She first noted whitish tongue decoloration a week ago. Problem is constant.  3 days ago she noted mild intermittent  burning tongue sensation. She has not identified exacerbating or alleviating factors.  She is not sure about "blisters" on tongue.  She denies fever,chills,unusual fatigue,sore throat,dysphagia,stridor,cough,or wheezing. She used baking soda toothpaste ,it helped with burning.  Negative for similar problem in the past. Negative for abdominal pain,N/V,or changes in bowel habits.  Since her last OV she has seen rheumatologist and neurologist. Recently Dx with Sjogren's   GERD on Protonix 40 mg daily.  ROS: See pertinent positives and negatives per HPI.  Past Medical History:  Diagnosis Date  . Allergic rhinitis   . Bartholin gland cyst 05/04/2013  . GERD (gastroesophageal reflux disease)   . Migraine without aura, without mention of intractable migraine without mention of status migrainosus 07/09/2013  . Ovarian cyst 2014  . Vaginal Pap smear, abnormal     Past Surgical History:  Procedure Laterality Date  . ADENOIDECTOMY    . ESSURE TUBAL LIGATION  07-05-2014  . THERAPEUTIC ABORTION    . TONSILLECTOMY    . TYMPANOSTOMY TUBE PLACEMENT      Family History  Problem Relation Age of Onset  . Hypertension Mother   . Heart disease  Father   . Alcohol abuse Father        dying from this  . Diabetes Father   . Mental retardation Maternal Uncle   . Diabetes Maternal Grandmother   . Hypertension Maternal Grandmother   . Kidney disease Maternal Grandmother   . Heart disease Maternal Grandmother   . Stroke Maternal Grandmother   . Prostate cancer Maternal Grandfather   . Alcohol abuse Other   . Hypertension Paternal Grandmother     Social History   Socioeconomic History  . Marital status: Single    Spouse name: Not on file  . Number of children: 3  . Years of education: college  . Highest education level: Not on file  Occupational History  . 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 Financial resource strain: Not on file  . Food insecurity    Worry: Not on file    Inability: Not on file  . Transportation needs    Medical: Not on file    Non-medical: Not on file  Tobacco Use  . Smoking status: Never Smoker  . Smokeless tobacco: Never Used  Substance and Sexual Activity  . Alcohol use: Yes    Comment: 2/month  . Drug use: No  . Sexual activity: Yes    Birth control/protection: Surgical  Lifestyle  . Physical activity    Days per week: Not on file    Minutes per session: Not on file  . Stress: Not on file  Relationships  . Social Herbalist on  phone: Not on file    Gets together: Not on file    Attends religious service: Not on file    Active member of club or organization: Not on file    Attends meetings of clubs or organizations: Not on file    Relationship status: Not on file  . Intimate partner violence    Fear of current or ex partner: Not on file    Emotionally abused: Not on file    Physically abused: Not on file    Forced sexual activity: Not on file  Other Topics Concern  . Not on file  Social History Narrative   Lives with her 4 children      Lives in two story home      Right handed      Highest level of edu-  [employer]       Current Outpatient Medications:  .  nystatin (MYCOSTATIN) 100000 UNIT/ML suspension, Take 5 mLs (500,000 Units total) by mouth 4 (four) times daily for 14 days., Disp: 200 mL, Rfl: 0 .  pantoprazole (PROTONIX) 40 MG tablet, Take 1 tablet (40 mg total) by mouth daily., Disp: 30 tablet, Rfl: 1  EXAM:  VITALS per patient if applicable:N/A  GENERAL: alert, oriented, appears well and in no acute distress  HEENT: atraumatic, conjunttiva clear, no obvious abnormalities on inspection of external nose and ears Whitish coat on posterior aspect of tongue,best seen on picture.No erythema or oral lesions appreciated.  LUNGS: on inspection no signs of respiratory distress, breathing rate appears normal, no obvious gross SOB, gasping or wheezing  CV: no obvious cyanosis  PSYCH/NEURO: pleasant and cooperative, no obvious depression or anxiety, speech and thought processing grossly intact  ASSESSMENT AND PLAN:  Discussed the following assessment and plan:  Oral thrush - Plan: nystatin (MYCOSTATIN) 100000 UNIT/ML suspension Other possible etiologies discussed. Adequate tongue hygiene. Nystatin sol until lesions resolved, 2-3 weeks. Instructed about warning signs. F/U as needed.   I discussed the assessment and treatment plan with the patient. She was provided an opportunity to ask questions and all were answered. She agreed with the plan and demonstrated an understanding of the instructions.   The patient was advised to call back or seek an in-person evaluation if the symptoms worsen or if the condition fails to improve as anticipated.  Return if symptoms worsen or fail to improve.     [place], MD

## 2019-04-27 ENCOUNTER — Other Ambulatory Visit: Payer: Self-pay | Admitting: Neurology

## 2019-04-28 ENCOUNTER — Ambulatory Visit
Admission: RE | Admit: 2019-04-28 | Discharge: 2019-04-28 | Disposition: A | Discharge status: Home / Self Care | Payer: No Typology Code available for payment source | Source: Ambulatory Visit | Attending: Neurology | Admitting: Neurology

## 2019-04-28 ENCOUNTER — Other Ambulatory Visit: Payer: Self-pay

## 2019-04-28 DIAGNOSIS — R42 Dizziness and giddiness: Secondary | ICD-10-CM

## 2019-04-28 DIAGNOSIS — H81399 Other peripheral vertigo, unspecified ear: Secondary | ICD-10-CM

## 2019-04-28 DIAGNOSIS — R202 Paresthesia of skin: Secondary | ICD-10-CM

## 2019-05-02 ENCOUNTER — Ambulatory Visit (HOSPITAL_BASED_OUTPATIENT_CLINIC_OR_DEPARTMENT_OTHER): Payer: No Typology Code available for payment source | Admitting: Family Medicine

## 2019-05-02 ENCOUNTER — Encounter: Payer: Self-pay | Admitting: Family Medicine

## 2019-05-02 ENCOUNTER — Other Ambulatory Visit: Payer: Self-pay

## 2019-05-02 DIAGNOSIS — Z5321 Procedure and treatment not carried out due to patient leaving prior to being seen by health care provider: Secondary | ICD-10-CM

## 2019-05-02 NOTE — Progress Notes (Signed)
New patient for weight loss

## 2019-05-04 ENCOUNTER — Encounter: Payer: Self-pay | Admitting: Family Medicine

## 2019-05-16 ENCOUNTER — Other Ambulatory Visit: Payer: Self-pay | Admitting: Family Medicine

## 2019-05-16 DIAGNOSIS — R5383 Other fatigue: Secondary | ICD-10-CM

## 2019-05-28 ENCOUNTER — Other Ambulatory Visit: Payer: Self-pay

## 2019-05-28 ENCOUNTER — Ambulatory Visit (INDEPENDENT_AMBULATORY_CARE_PROVIDER_SITE_OTHER): Payer: No Typology Code available for payment source | Admitting: Family Medicine

## 2019-05-28 ENCOUNTER — Encounter: Payer: Self-pay | Admitting: Family Medicine

## 2019-05-28 VITALS — BP 118/70 | HR 72 | Temp 97.6°F | Resp 12 | Ht 65.0 in

## 2019-05-28 DIAGNOSIS — K219 Gastro-esophageal reflux disease without esophagitis: Secondary | ICD-10-CM | POA: Diagnosis not present

## 2019-05-28 DIAGNOSIS — R002 Palpitations: Secondary | ICD-10-CM

## 2019-05-28 DIAGNOSIS — R071 Chest pain on breathing: Secondary | ICD-10-CM

## 2019-05-28 DIAGNOSIS — R0789 Other chest pain: Secondary | ICD-10-CM

## 2019-05-28 NOTE — Progress Notes (Signed)
ACUTE VISIT   HPI:  Chief Complaint  Patient presents with  . Palpitations    [name] is a 33 y.o. female, who is here today complaining of 1 month of intermittent episodes of palpitation, she feels like she is "skipping beats", sometimes it happens with exertion. She has checked HR during episode and max 95/min. Mild stress but she does not think this is causing palpitations. 5 episodes in a month. She does not drink sodas or coffee and has not had chocolate in about 3 weeks.  No associated dyspnea, chest pain, diaphoresis, or dizziness.  For the past 4 days she has had chest tightness, usually starts on the left side and radiates to the mid sternal area. She felt pain radiated to right mandibular area x 1.  Chest discomfort is not associated with above problem.  It  is usually at night when she is in bed, usually last about 5 minutes but the last episode lasted a couple days. Sometimes pain is exacerbated by deep breathing. Negative for fever, chills, sore throat, cough, or wheezing. No history of recent URI or trauma.  She has tried Tums, did not help. Sleeping with bed head elevation seems to help.  She has not identified exacerbating or alleviating factors. She is not having chest pain today.  Lab Results  Component Value Date   TSH 1.10 03/02/2019    Lab Results  Component Value Date   CREATININE 0.88 03/02/2019   BUN 15 03/02/2019   NA 139 03/02/2019   K 3.9 03/02/2019   CL 103 03/02/2019   CO2 23 03/02/2019   + Acid reflux, she denies nausea or vomiting. History of GERD, she is currently on Protonix 40 mg, she has not take medication for a week or so. Negative for abdominal pain,N/V, changes in bowel habits, or melena.   Review of Systems  Constitutional: Negative for activity change, appetite change and unexpected weight change.  HENT: Negative for mouth sores and trouble swallowing.   Respiratory: Negative for stridor.    Cardiovascular: Negative for leg swelling.  Genitourinary: Negative for decreased urine volume, dysuria and hematuria.  Musculoskeletal: Negative for back pain and gait problem.  Skin: Negative for pallor and rash.  Neurological: Negative for syncope, weakness, numbness and headaches.  Psychiatric/Behavioral: Negative for confusion. The patient is nervous/anxious.   Rest see pertinent positives and negatives per HPI.   Current Outpatient Medications on File Prior to Visit  Medication Sig Dispense Refill  . pantoprazole (PROTONIX) 40 MG tablet Take 1 tablet (40 mg total) by mouth daily. 30 tablet 1   No current facility-administered medications on file prior to visit.    Past Medical History:  Diagnosis Date  . Allergic rhinitis   . Bartholin gland cyst 05/04/2013  . GERD (gastroesophageal reflux disease)   . Migraine without aura, without mention of intractable migraine without mention of status migrainosus 07/09/2013  . Ovarian cyst 2014  . Vaginal Pap smear, abnormal    Allergies  Allergen Reactions  . Decongestant [Pseudoephedrine] Other (See Comments)    SI   . Iodinated Diagnostic Agents Hives  . Percocet [Oxycodone-Acetaminophen] Itching    Social History   Socioeconomic History  . Marital status: Single    Spouse name: Not on file  . Number of children: 3  . Years of education: college  . Highest education level: Not on file  Occupational History  . Occupation: CMA    Employer: [employer]  Comment: [place] at [employer]  . Financial resource strain: Not on file  . Food insecurity    Worry: Not on file    Inability: Not on file  . Transportation needs    Medical: Not on file    Non-medical: Not on file  Tobacco Use  . Smoking status: Never Smoker  . Smokeless tobacco: Never Used  Substance and Sexual Activity  . Alcohol use: Yes    Comment: 2/month  . Drug use: No  . Sexual activity: Yes    Birth control/protection: Surgical   Lifestyle  . Physical activity    Days per week: Not on file    Minutes per session: Not on file  . Stress: Not on file  Relationships  . Social Herbalist on phone: Not on file    Gets together: Not on file    Attends religious service: Not on file    Active member of club or organization: Not on file    Attends meetings of clubs or organizations: Not on file    Relationship status: Not on file  Other Topics Concern  . Not on file  Social History Narrative   Lives with her 4 children      Lives in two story home      Right handed      Highest level of edu- Associates       Vitals:   05/28/19 1707  BP: 118/70  Pulse: 72  Resp: 12  Temp: 97.6 F (36.4 C)  SpO2: 99%   Body mass index is 30.79 kg/m.   Physical Exam  Nursing note and vitals reviewed. Constitutional: She is oriented to person, place, and time. She appears well-developed. No distress.  HENT:  Head: Normocephalic and atraumatic.  Mouth/Throat: Oropharynx is clear and moist and mucous membranes are normal.  Eyes: Pupils are equal, round, and reactive to light. Conjunctivae are normal.  Cardiovascular: Normal rate and regular rhythm.  No murmur heard. Pulses:      Dorsalis pedis pulses are 2+ on the right side and 2+ on the left side.    Respiratory: Effort normal and breath sounds normal. No respiratory distress. She exhibits tenderness (Upon palpation of left chondrocostal joints.).  GI: Soft. She exhibits no mass. There is no hepatomegaly. There is no abdominal tenderness.  Musculoskeletal:        General: No edema.  Lymphadenopathy:    She has no cervical adenopathy.  Neurological: She is alert and oriented to person, place, and time. She has normal strength. No cranial nerve deficit. Gait normal.  Skin: Skin is warm. No rash noted. No erythema.  Psychiatric: Her mood appears anxious.  Well groomed, good eye contact.    ASSESSMENT AND PLAN:   Ms. Veronica Raymond was seen today for  palpitations.  Diagnoses and all orders for this visit:  Palpitations History and examination today do not suggest a serious process. We discussed possible etiologies, most benign given her age. EKG done today: NSR,normal axis and intervals. EKG 09/05/18 ? PAC in DIII and negative T wave precordials not longer present.Rest no significant changes. Because anxious cardiology referral was offered,she may need a heart monitor for a few days;she prefers to hold on cardiology referral. Clearly instructed about warning signs.  -     EKG 12-Lead  Chest tightness Possible causes discussed. Explained that given her CVD risk factors the likelihood of this symptom being cardiac related is low but never 0. EKG today negative  for ischemic changes, normal. It seems to be musculoskeletal. GERD could also be a contributing factor. Clearly instructed about warning signs.  Costochondral chest pain We discussed possible etiologies. Avoid shallow breathing. Because history of GERD, I do recommend NSAIDs. OTC acetaminophen/Tylenol 500 mg 3-4 times per day may help.  Gastroesophageal reflux disease without esophagitis Recommend resuming Protonix 40 mg daily. GERD precautions also recommended.   Return if symptoms worsen or fail to improve.   Jolly Bleicher G. Martinique, MD  [facility]. [place] office.

## 2019-06-06 ENCOUNTER — Other Ambulatory Visit: Payer: Self-pay

## 2019-06-06 ENCOUNTER — Ambulatory Visit (INDEPENDENT_AMBULATORY_CARE_PROVIDER_SITE_OTHER): Payer: No Typology Code available for payment source | Admitting: Family Medicine

## 2019-06-06 ENCOUNTER — Encounter: Payer: Self-pay | Admitting: Family Medicine

## 2019-06-06 VITALS — BP 118/80 | HR 72 | Resp 12 | Ht 65.0 in | Wt 187.2 lb

## 2019-06-06 DIAGNOSIS — Z0000 Encounter for general adult medical examination without abnormal findings: Secondary | ICD-10-CM | POA: Diagnosis not present

## 2019-06-06 DIAGNOSIS — G8929 Other chronic pain: Secondary | ICD-10-CM

## 2019-06-06 DIAGNOSIS — M545 Low back pain, unspecified: Secondary | ICD-10-CM

## 2019-06-06 DIAGNOSIS — M542 Cervicalgia: Secondary | ICD-10-CM | POA: Diagnosis not present

## 2019-06-06 DIAGNOSIS — M25561 Pain in right knee: Secondary | ICD-10-CM | POA: Diagnosis not present

## 2019-06-06 NOTE — Patient Instructions (Signed)
A few things to remember from today's visit:   Cervicalgia - Plan: DG Lumbar Spine Complete  Chronic bilateral low back pain without sciatica - Plan: DG Cervical Spine Complete  Right knee pain, unspecified chronicity - Plan: DG Knee Complete 4 Views Right  Routine general medical examination at a health care facility  Today you have you routine preventive visit.  At least 150 minutes of moderate exercise per week, daily brisk walking for 15-30 min is a good exercise option. Healthy diet low in saturated (animal) fats and sweets and consisting of fresh fruits and vegetables, lean meats such as fish and white chicken and whole grains.  These are some of recommendations for screening depending of age and risk factors:   - Vaccines:  Tdap vaccine every 10 years.  Shingles vaccine recommended at age 65, could be given after 32 years of age but not sure about insurance coverage.   Pneumonia vaccines:  Prevnar 13 at 65 and Pneumovax at 68. Sometimes Pneumovax is giving earlier if history of smoking, lung disease,diabetes,kidney disease among some.    Screening for diabetes at age 22 and every 3 years.  Cervical cancer prevention:  Pap smear starts at 32 years of age and continues periodically until 32 years old in low risk women. Pap smear every 3 years between 64 and 11 years old. Pap smear every 3-5 years between women 66 and older if pap smear negative and HPV screening negative.   -Breast cancer: Mammogram: There is disagreement between experts about when to start screening in low risk asymptomatic female but recent recommendations are to start screening at 76 and not later than 32 years old , every 1-2 years and after 32 yo q 2 years. Screening is recommended until 32 years old but some women can continue screening depending of healthy issues.   Colon cancer screening: starts at 32 years old until 32 years old.  Cholesterol disorder screening at age 55 and every 3  years.  Also recommended:  1. Dental visit- Brush and floss your teeth twice daily; visit your dentist twice a year. 2. Eye doctor- Get an eye exam at least every 2 years. 3. Helmet use- Always wear a helmet when riding a bicycle, motorcycle, rollerblading or skateboarding. 4. Safe sex- If you may be exposed to sexually transmitted infections, use a condom. 5. Seat belts- Seat belts can save your live; always wear one. 6. Smoke/Carbon Monoxide detectors- These detectors need to be installed on the appropriate level of your home. Replace batteries at least once a year. 7. Skin cancer- When out in the sun please cover up and use sunscreen 15 SPF or higher. 8. Violence- If anyone is threatening or hurting you, please tell your healthcare provider.  9. Drink alcohol in moderation- Limit alcohol intake to one drink or less per day. Never drink and drive.  Please be sure medication list is accurate. If a new problem present, please set up appointment sooner than planned today.

## 2019-06-06 NOTE — Progress Notes (Signed)
HPI:   Veronica Raymond is a 33 y.o. female, who is here today for her routine physical.  Last CPE: 01/27/2018.  Regular exercise 3 or more time per week: 2 weeks ago she started working with a trainer, exercising 2 times per week. Following a healthy diet: For 2 weeks she has been controlling portions and cabs. She lives with her boyfriend and 4 children.  Chronic medical problems: GERD, arthralgias,back pain,allergies,and migraine headaches.  Pap smear on 01/27/18,endocervical cell absent, negative. HPV not detected.. Follow with Dr Matthew Saras, gyn.  Immunization History  Administered Date(s) Administered  . Influenza-Unspecified 08/27/2017    She has some concerns today. She wonders if FLP needs to be re-checked. No Hx of HLD.  Lab Results  Component Value Date   CHOL 180 01/27/2018   HDL 53 01/27/2018   LDLCALC 108 (H) 01/27/2018   TRIG 95 01/27/2018   CHOLHDL 3.4 01/27/2018   She would like to have X rays done. She has long Hx of knee pain and back pain,both have been worse since she started exercising. States that she has had knee pain since her 20's. No edema or erythema.  Lab Results  Component Value Date   CRP 3.5 03/02/2019   Lab Results  Component Value Date   ESRSEDRATE 17 03/02/2019   Mid lower back pain intermittent for a while. Pain is not radiated. Negative for LE numbness,tingling,saddle anesthesia,or changes in bowel/urine continence. Hx of mild urine incontinence.  Also cervical pain,no radiated. Pain exacerbated with movement.  No associated fever,chills,skin edema/erythema on affected area.   Review of Systems  Constitutional: Negative for appetite change and fatigue.  HENT: Negative for hearing loss, mouth sores and trouble swallowing.   Eyes: Negative for discharge and visual disturbance.  Respiratory: Negative for cough, shortness of breath and wheezing.   Cardiovascular: Negative for chest pain, palpitations and leg  swelling.  Gastrointestinal: Negative for abdominal pain, nausea and vomiting.       No changes in bowel habits.  Endocrine: Negative for cold intolerance, heat intolerance, polydipsia, polyphagia and polyuria.  Genitourinary: Negative for decreased urine volume, dysuria, hematuria, vaginal bleeding and vaginal discharge.  Musculoskeletal: Positive for arthralgias, back pain and neck pain. Negative for joint swelling.  Skin: Negative for color change and rash.  Allergic/Immunologic: Positive for environmental allergies.  Neurological: Negative for syncope, weakness and headaches.  Hematological: Negative for adenopathy. Does not bruise/bleed easily.  Psychiatric/Behavioral: Negative for confusion and sleep disturbance. The patient is not nervous/anxious.   All other systems reviewed and are negative.   Current Outpatient Medications on File Prior to Visit  Medication Sig Dispense Refill  . pantoprazole (PROTONIX) 40 MG tablet Take 1 tablet (40 mg total) by mouth daily. 30 tablet 1   No current facility-administered medications on file prior to visit.      Past Medical History:  Diagnosis Date  . Allergic rhinitis   . Bartholin gland cyst 05/04/2013  . GERD (gastroesophageal reflux disease)   . Migraine without aura, without mention of intractable migraine without mention of status migrainosus 07/09/2013  . Ovarian cyst 2014  . Vaginal Pap smear, abnormal     Past Surgical History:  Procedure Laterality Date  . ADENOIDECTOMY    . ESSURE TUBAL LIGATION  07-05-2014  . THERAPEUTIC ABORTION    . TONSILLECTOMY    . TYMPANOSTOMY TUBE PLACEMENT      Allergies  Allergen Reactions  . Decongestant [Pseudoephedrine] Other (See Comments)    SI   .  Iodinated Diagnostic Agents Hives  . Percocet [Oxycodone-Acetaminophen] Itching    Family History  Problem Relation Age of Onset  . Hypertension Mother   . Heart disease Father   . Alcohol abuse Father        dying from this  .  Diabetes Father   . Mental retardation Maternal Uncle   . Diabetes Maternal Grandmother   . Hypertension Maternal Grandmother   . Kidney disease Maternal Grandmother   . Heart disease Maternal Grandmother   . Stroke Maternal Grandmother   . Prostate cancer Maternal Grandfather   . Alcohol abuse Other   . Hypertension Paternal Grandmother     Social History   Socioeconomic History  . Marital status: Single    Spouse name: Not on file  . Number of children: 3  . Years of education: college  . Highest education level: Not on file  Occupational History  . 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 Financial resource strain: Not on file  . Food insecurity    Worry: Not on file    Inability: Not on file  . Transportation needs    Medical: Not on file    Non-medical: Not on file  Tobacco Use  . Smoking status: Never Smoker  . Smokeless tobacco: Never Used  Substance and Sexual Activity  . Alcohol use: Yes    Comment: 2/month  . Drug use: No  . Sexual activity: Yes    Birth control/protection: Surgical  Lifestyle  . Physical activity    Days per week: Not on file    Minutes per session: Not on file  . Stress: Not on file  Relationships  . Social Herbalist on phone: Not on file    Gets together: Not on file    Attends religious service: Not on file    Active member of club or organization: Not on file    Attends meetings of clubs or organizations: Not on file    Relationship status: Not on file  Other Topics Concern  . Not on file  Social History Narrative   Lives with her 4 children      Lives in two story home      Right handed      Highest level of edu- [employer]      Vitals:   06/06/19 1611  BP: 118/80  Pulse: 72  Resp: 12  SpO2: 99%   Body mass index is 31.16 kg/m.   Wt Readings from Last 3 Encounters:  06/06/19 187 lb 4 oz (84.9 kg)  03/27/19 185 lb (83.9 kg)  03/15/19 185 lb  (83.9 kg)    Physical Exam  Nursing note and vitals reviewed. Constitutional: She is oriented to person, place, and time. She appears well-developed. No distress.  HENT:  Head: Normocephalic and atraumatic.  Right Ear: Hearing, tympanic membrane, external ear and ear canal normal.  Left Ear: Hearing, tympanic membrane, external ear and ear canal normal.  Mouth/Throat: Uvula is midline, oropharynx is clear and moist and mucous membranes are normal.  Eyes: Pupils are equal, round, and reactive to light. Conjunctivae and EOM are normal.  Neck: No tracheal deviation present. No thyromegaly present.  Cardiovascular: Normal rate and regular rhythm.  No murmur heard. Pulses:      Dorsalis pedis pulses are 2+ on the right side and 2+ on the left side.  Respiratory: Effort normal and breath sounds normal.  No respiratory distress.  GI: Soft. She exhibits no mass. There is no hepatomegaly. There is no abdominal tenderness.  Genitourinary:    Genitourinary Comments: Deferred to gyn.   Musculoskeletal:        General: No edema.     Right knee: She exhibits normal range of motion, no swelling and no erythema.     Left knee: She exhibits normal range of motion, no swelling and no erythema.     Cervical back: She exhibits normal range of motion and no tenderness.     Lumbar back: She exhibits tenderness (around SI joint ,bilateral.).     Comments: No major deformity or signs of synovitis appreciated. Neck elicited by ROM. Knee crepitus, L>R. No pain with ROM.  Lymphadenopathy:    She has no cervical adenopathy.       Right: No supraclavicular adenopathy present.       Left: No supraclavicular adenopathy present.  Neurological: She is alert and oriented to person, place, and time. She has normal strength. No cranial nerve deficit. Coordination and gait normal.  Reflex Scores:      Bicep reflexes are 2+ on the right side and 2+ on the left side.      Patellar reflexes are 2+ on the right side and  2+ on the left side. Skin: Skin is warm. No rash noted. No erythema.  Psychiatric: Her mood appears anxious. Cognition and memory are normal.  Well groomed, good eye contact.   ASSESSMENT AND PLAN:  Veronica Raymond was here today annual physical examination.  Orders Placed This Encounter  Procedures  . DG Lumbar Spine Complete  . DG Cervical Spine Complete  . DG Knee Complete 4 Views Right    Routine general medical examination at a health care facility We discussed the importance of regular physical activity and healthy diet for prevention of chronic illness and/or complications. Preventive guidelines reviewed. I do not think FLP is needed this year.  Continue following with gyn for her female preventive care. Vaccination up to date. She is taking influenza vaccine Friday.  Next CPE in a year.  Cervicalgia ROM exercises. -     DG Lumbar Spine Complete; Future  Chronic bilateral low back pain without sciatica Chiropractor vs PT evaluation/treatment may help, she will let me know if interested in either one. Cervical X ray ordered as requested.  -     DG Cervical Spine Complete; Future  Right knee pain, unspecified chronicity Low impact exercise recommended. ?Patello femoral synd, some PT exercises showed for her to do at home.  -     DG Knee Complete 4 Views Right; Future    Return in 1 year (on 06/05/2020) for cpe.    Yamna Mackel G. [place], MD  [facility]. [place] office.

## 2019-06-21 ENCOUNTER — Ambulatory Visit (INDEPENDENT_AMBULATORY_CARE_PROVIDER_SITE_OTHER): Payer: No Typology Code available for payment source

## 2019-06-21 ENCOUNTER — Other Ambulatory Visit: Payer: Self-pay

## 2019-06-21 ENCOUNTER — Ambulatory Visit (HOSPITAL_COMMUNITY): Payer: Self-pay | Attending: Family Medicine

## 2019-06-21 DIAGNOSIS — G8929 Other chronic pain: Secondary | ICD-10-CM | POA: Diagnosis not present

## 2019-06-21 DIAGNOSIS — M545 Low back pain: Secondary | ICD-10-CM | POA: Diagnosis not present

## 2019-06-21 DIAGNOSIS — M25561 Pain in right knee: Secondary | ICD-10-CM

## 2019-06-21 DIAGNOSIS — M542 Cervicalgia: Secondary | ICD-10-CM | POA: Diagnosis not present

## 2019-06-30 ENCOUNTER — Encounter: Payer: Self-pay | Admitting: Family Medicine

## 2019-07-01 ENCOUNTER — Encounter: Payer: Self-pay | Admitting: Family Medicine

## 2019-07-23 ENCOUNTER — Telehealth (INDEPENDENT_AMBULATORY_CARE_PROVIDER_SITE_OTHER): Payer: No Typology Code available for payment source | Admitting: Family Medicine

## 2019-07-23 VITALS — Ht 65.0 in

## 2019-07-23 DIAGNOSIS — R519 Headache, unspecified: Secondary | ICD-10-CM | POA: Diagnosis not present

## 2019-07-23 DIAGNOSIS — R1030 Lower abdominal pain, unspecified: Secondary | ICD-10-CM

## 2019-07-23 DIAGNOSIS — R079 Chest pain, unspecified: Secondary | ICD-10-CM

## 2019-07-23 DIAGNOSIS — K219 Gastro-esophageal reflux disease without esophagitis: Secondary | ICD-10-CM | POA: Diagnosis not present

## 2019-07-23 NOTE — Progress Notes (Signed)
Like pain in suprapubic area virtual Visit via Video Note   I connected with Veronica Raymond on 07/23/19 by a video enabled telemedicine application and verified that I am speaking with the correct person using two identifiers.  Location patient: home Location provider:work office Persons participating in the virtual visit: patient, provider  I discussed the limitations of evaluation and management by telemedicine and the availability of in person appointments. The patient expressed understanding and agreed to proceed.   HPI: Veronica Raymond is a 33 yo female c/o suprapubic stabbing abdominal pain that is started about 2 weeks ago, intermittent, no radiated. Most of the time is 2/10 but sometimes 6/10, and seldom 9/10.  She has not sure about exacerbating but sometimes she feels like it is worse when she drinks orange juice. No alleviating factors identified. Denies nausea,vomiting,melena,or urinary symptom.  About 2 weeks ago she was constipated, improved after drinking milk of magnesium. Last bowel movement this morning. She has not noted blood or mucus in the stool. Abdominal pain does not improve with defecation.  She thought it was acid reflux but symptoms did not improve with pantoprazole.  She also mentions mid sternal chest pain. Chest pain exacerbated by deep breathing and palpation. She is not having heartburn. Some burping. Denies associated cough,wheezing,palpitations, or diaphoresis. She has not noted rash.  She is also complaining about left frontal and cheek stabbing pain, intermittently also for about 2 weeks. 5/10.  Achy-like pain around her left ear. She denies ear drainage, hearing loss, sore throat, nasal congestion, or rhinorrhea. She is having dental issues , left upper teeth, she is following with dentist.  Denies fever,chills,or unusual body aches. + Fatigue. She has not taken OTC analgesics.  ROS: See pertinent positives and negatives per HPI.  Past  Medical History:  Diagnosis Date  . Allergic rhinitis   . Bartholin gland cyst 05/04/2013  . GERD (gastroesophageal reflux disease)   . Migraine without aura, without mention of intractable migraine without mention of status migrainosus 07/09/2013  . Ovarian cyst 2014  . Vaginal Pap smear, abnormal     Past Surgical History:  Procedure Laterality Date  . ADENOIDECTOMY    . ESSURE TUBAL LIGATION  07-05-2014  . THERAPEUTIC ABORTION    . TONSILLECTOMY    . TYMPANOSTOMY TUBE PLACEMENT      Family History  Problem Relation Age of Onset  . Hypertension Mother   . Heart disease Father   . Alcohol abuse Father        dying from this  . Diabetes Father   . Mental retardation Maternal Uncle   . Diabetes Maternal Grandmother   . Hypertension Maternal Grandmother   . Kidney disease Maternal Grandmother   . Heart disease Maternal Grandmother   . Stroke Maternal Grandmother   . Prostate cancer Maternal Grandfather   . Alcohol abuse Other   . Hypertension Paternal Grandmother     Social History   Socioeconomic History  . Marital status: Single    Spouse name: Not on file  . Number of children: 3  . Years of education: college  . Highest education level: Not on file  Occupational History  . 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 Financial resource strain: Not on file  . Food insecurity    Worry: Not on file    Inability: Not on file  . Transportation needs    Medical: Not on file  Non-medical: Not on file  Tobacco Use  . Smoking status: Never Smoker  . Smokeless tobacco: Never Used  Substance and Sexual Activity  . Alcohol use: Yes    Comment: 2/month  . Drug use: No  . Sexual activity: Yes    Birth control/protection: Surgical  Lifestyle  . Physical activity    Days per week: Not on file    Minutes per session: Not on file  . Stress: Not on file  Relationships  . Social Herbalist on phone: Not  on file    Gets together: Not on file    Attends religious service: Not on file    Active member of club or organization: Not on file    Attends meetings of clubs or organizations: Not on file    Relationship status: Not on file  . Intimate partner violence    Fear of current or ex partner: Not on file    Emotionally abused: Not on file    Physically abused: Not on file    Forced sexual activity: Not on file  Other Topics Concern  . Not on file  Social History Narrative   Lives with her 4 children      Lives in two story home      Right handed      Highest level of edu- [facility]       Current Outpatient Medications:  .  pantoprazole (PROTONIX) 40 MG tablet, Take 1 tablet (40 mg total) by mouth daily., Disp: 30 tablet, Rfl: 1  EXAM:  VITALS per patient if applicable:Ht 5\' 5"  (1.651 m)   LMP 07/16/2019   BMI 31.16 kg/m   GENERAL: alert, oriented, appears well and in no acute distress  HEENT: atraumatic, conjunctiva clear, no obvious abnormalities on inspection of external nose and ears. No erythema,edema,or rash on affected area.  NECK: normal movements of the head and neck  LUNGS: on inspection no signs of respiratory distress, breathing rate appears normal, no obvious gross SOB, gasping or wheezing  CV: no obvious cyanosis  Veronica: moves all visible extremities without noticeable abnormality. Tenderness when she palpates left chondrocostal   PSYCH/NEURO: pleasant and cooperative, no obvious depression, she is anxious. Speech and thought processing grossly intact  ASSESSMENT AND PLAN:  Discussed the following assessment and plan:  Lower abdominal pain We discussed possible etiologies, could be related to constipation.  Abdominal and pelvic CT done 09/11/18:Negative.  I do not think imaging is needed today. Recommend increasing fiber intake, adequate water intake. OTC Benafiber bid. Instructed about warning signs.  Facial pain It could be related to dental  problems. No focal deficit. I do not think head imaging is needed at this time.  Brain MRI 04/28/19 was done because dizziness,right-sided paresthesia,and confusion:Negative.  Chest pain, unspecified type It seems to be chest wall pain, ? Costochondritis. Low probability of this pain being cardiac etiology. Clearly instructed about warning signs.  Gastroesophageal reflux disease without esophagitis Chest pain could be aggravated by this problem. No changes in Pantoprazole. GERD precautions recommended.    I discussed the assessment and treatment plan with the patient. She was provided an opportunity to ask questions and all were answered. She agreed with the plan and demonstrated an understanding of the instructions.   The patient was advised to call back or seek an in-person evaluation if the symptoms worsen or if the condition fails to improve as anticipated.  Return in about 4 weeks (around 08/20/2019) for abdominal pain and facial  pain..    Evangeline Utley [place], MD

## 2019-07-26 ENCOUNTER — Encounter: Payer: Self-pay | Admitting: Family Medicine

## 2019-08-06 ENCOUNTER — Encounter: Payer: Self-pay | Admitting: Family Medicine

## 2019-08-06 ENCOUNTER — Other Ambulatory Visit: Payer: Self-pay | Admitting: Family Medicine

## 2019-08-06 DIAGNOSIS — E6609 Other obesity due to excess calories: Secondary | ICD-10-CM

## 2019-08-06 DIAGNOSIS — Z6831 Body mass index (BMI) 31.0-31.9, adult: Secondary | ICD-10-CM

## 2019-08-09 ENCOUNTER — Institutional Professional Consult (permissible substitution): Payer: No Typology Code available for payment source | Admitting: Pulmonary Disease

## 2019-08-16 ENCOUNTER — Other Ambulatory Visit: Payer: Self-pay

## 2019-08-16 ENCOUNTER — Ambulatory Visit (INDEPENDENT_AMBULATORY_CARE_PROVIDER_SITE_OTHER): Payer: Self-pay | Admitting: Family Medicine

## 2019-08-16 ENCOUNTER — Encounter (INDEPENDENT_AMBULATORY_CARE_PROVIDER_SITE_OTHER): Payer: Self-pay | Admitting: Bariatrics

## 2019-08-16 ENCOUNTER — Ambulatory Visit (INDEPENDENT_AMBULATORY_CARE_PROVIDER_SITE_OTHER): Payer: No Typology Code available for payment source | Admitting: Bariatrics

## 2019-08-16 ENCOUNTER — Encounter: Payer: Self-pay | Admitting: Bariatrics

## 2019-08-16 VITALS — BP 109/75 | HR 72 | Temp 98.4°F | Ht 64.0 in | Wt 183.0 lb

## 2019-08-16 DIAGNOSIS — Z0289 Encounter for other administrative examinations: Secondary | ICD-10-CM

## 2019-08-16 DIAGNOSIS — R5383 Other fatigue: Secondary | ICD-10-CM | POA: Diagnosis not present

## 2019-08-16 DIAGNOSIS — R0602 Shortness of breath: Secondary | ICD-10-CM | POA: Diagnosis not present

## 2019-08-16 DIAGNOSIS — F3289 Other specified depressive episodes: Secondary | ICD-10-CM

## 2019-08-16 DIAGNOSIS — Z6831 Body mass index (BMI) 31.0-31.9, adult: Secondary | ICD-10-CM

## 2019-08-16 DIAGNOSIS — Z9189 Other specified personal risk factors, not elsewhere classified: Secondary | ICD-10-CM | POA: Diagnosis not present

## 2019-08-16 DIAGNOSIS — E559 Vitamin D deficiency, unspecified: Secondary | ICD-10-CM | POA: Diagnosis not present

## 2019-08-16 DIAGNOSIS — K219 Gastro-esophageal reflux disease without esophagitis: Secondary | ICD-10-CM | POA: Diagnosis not present

## 2019-08-16 DIAGNOSIS — M2550 Pain in unspecified joint: Secondary | ICD-10-CM

## 2019-08-16 DIAGNOSIS — G43809 Other migraine, not intractable, without status migrainosus: Secondary | ICD-10-CM

## 2019-08-16 DIAGNOSIS — R0683 Snoring: Secondary | ICD-10-CM

## 2019-08-16 DIAGNOSIS — E6609 Other obesity due to excess calories: Secondary | ICD-10-CM

## 2019-08-16 NOTE — Progress Notes (Signed)
Office: [phone]  /  Fax: [phone]   Dear Dr. Betty Martinique,   Thank you for referring Veronica Raymond to our clinic. The following note includes my evaluation and treatment recommendations.  HPI:   Chief Complaint: OBESITY    AISSA FERNICOLA has been referred by Dr. Betty Martinique for consultation regarding her obesity and obesity related comorbidities.    COPELAND GAUDREAULT (MR# [id number]) is a 33 y.o. female who presents on 08/16/2019 for obesity evaluation and treatment. Current BMI is Body mass index is 31.41 kg/m.Marland Kitchen Zeda has been struggling with her weight for many years and has been unsuccessful in either losing weight, maintaining weight loss, or reaching her healthy weight goal.     Avanell states she is currently in the action stage of change and ready to dedicate time achieving and maintaining a healthier weight. Arieyanna is interested in becoming our patient and working on intensive lifestyle modifications including (but not limited to) diet, exercise and weight loss.     Brooklee is lactose intolerant. She states that she has a hard time knowing when she is full. She states that she is a "picky eater."    Yasleen states her family eats meals together she thinks her family will eat healthier with her sometimes she struggles with family and or coworkers weight loss sabotage her desired weight loss is 38-43 lbs  she started gaining weight in 2011 her heaviest weight ever was 201 lbs. she is a picky eater and doesn't like to eat healthier foods  she craves [place], [place], and [place] foods  she skips breakfast and sometimes dinner she is frequently drinking liquids with calories she frequently makes poor food choices she has problems with excessive hunger in the early mornings she frequently eats larger portions than normal  she has binge eating behaviors she struggles with emotional eating    Fatigue Ova feels her energy is lower than it should be. This has  worsened with weight gain and has worsened recently. Aleksis denies daytime somnolence and  denies waking up still tired. Patient generally gets 6 hours of sleep per night, and states they generally do not sleep well most nights. Snoring is present. Apneic episodes are present. Epworth Sleepiness Score is 11.  Dyspnea on exertion Siarrah notes increasing shortness of breath with exercising and seems to be worsening over time with weight gain. She notes getting out of breath sooner with activity than she used to. This has gotten worse recently. Gabriela denies orthopnea.  Polyarthralgias Viviann has polyarthralgias and went to a rheumatologist. She had positive autoimmune testing 03/02/2019 (see specific tests).  Migraines with Janyla Ikard has migraine headaches with aura and uses hot or cold compresses. Triggers are sodium, chocolate, and computer screens.  Gastroesophageal Reflux Disease (GERD) Johnathan has a diagnosis of gastroesophageal reflux disease and takes occasional Protonix.  Vitamin D deficiency Hollynn has a diagnosis of Vitamin D deficiency. She is currently taking a multivitamin and denies nausea, vomiting or muscle weakness.  At risk for osteopenia and osteoporosis Clayton is at higher risk of osteopenia and osteoporosis due to Vitamin D deficiency.   Depression with emotional eating behaviors Aldena is struggling with emotional eating and using food for comfort to the extent that it is negatively impacting her health. She often snacks when she is not hungry. Lyllie sometimes feels she is out of control and then feels guilty that she made poor food choices. She has been working on behavior modification techniques to help reduce her emotional  eating and has been somewhat successful. Gana reports stress emotional eating. PHQ-9 score today was 18. She states she had gone to counseling in the past. She shows no sign of suicidal or homicidal ideations.  Depression Screen Teighan's Food and  Mood (modified PHQ-9) score was 18. Depression screen PHQ 2/9 08/16/2019  Decreased Interest 3  Down, Depressed, Hopeless 3  PHQ - 2 Score 6  Altered sleeping 3  Tired, decreased energy 3  Change in appetite 2  Feeling bad or failure about yourself  3  Trouble concentrating 1  Moving slowly or fidgety/restless 0  Suicidal thoughts 0  PHQ-9 Score 18  Difficult doing work/chores Not difficult at all   Snoring Annelisa reports snoring and having non-restorative sleep.  ASSESSMENT AND PLAN:  Other fatigue - Plan: EKG 12-Lead, T3, T4, free, TSH, Insulin, random, HgB A1c, Comprehensive Metabolic Panel (CMET)  Shortness of breath on exertion - Plan: Lipid Panel With LDL/HDL Ratio  Gastroesophageal reflux disease without esophagitis  Vitamin D deficiency - Plan: Vitamin D (25 hydroxy)  Snoring  Other migraine without status migrainosus, not intractable  Polyarthralgia  Other depression,emotional eating  At risk for osteoporosis  PLAN:  Fatigue Alysen was informed that her fatigue may be related to obesity, depression or many other causes. Labs will be ordered, and in the meanwhile Linnae has agreed to work on diet, exercise and weight loss to help with fatigue. Proper sleep hygiene was discussed including the need for 7-8 hours of quality sleep each night. A sleep study was not ordered based on symptoms and Epworth score. Jadalynn will gradually increase activity.  Dyspnea on exertion [place]'s shortness of breath appears to be obesity related and exercise induced. She has agreed to work on weight loss and gradually increase exercise to treat her exercise induced shortness of breath. If Rease follows our instructions and loses weight without improvement of her shortness of breath, we will plan to refer to pulmonology. We will monitor this condition regularly. Auri agrees to this plan.  Polyarthralgias Kaliya was instructed to follow-up with her rheumatologist.  Migraines with  Moise Boring was instructed to follow-up with her PCP.  Gastroesophageal Reflux Disease (GERD) Lislie was advised to watch trigger foods.  Vitamin D Deficiency Jesha was informed that low Vitamin D levels contributes to fatigue and are associated with obesity, breast, and colon cancer. She will have routine testing of Vitamin D and follow-up with our clinic in 2-3 weeks.  At risk for osteopenia and osteoporosis Olanna was given extended  (15 minutes) osteoporosis prevention counseling today. Quanetta is at risk for osteopenia and osteoporosis due to her Vitamin D deficiency. She was encouraged to take her Vitamin D and follow her higher calcium diet and increase strengthening exercise to help strengthen her bones and decrease her risk of osteopenia and osteoporosis.  Depression with Emotional Eating Behaviors We discussed behavior modification techniques today to help Laylonnie deal with her emotional eating and depression. We will decide if she needs to see Dr. Mallie Mussel in the future.  Depression Screen Tony had a strongly positive depression screening. Depression is commonly associated with obesity and often results in emotional eating behaviors. We will monitor this closely and work on CBT to help improve the non-hunger eating patterns. Referral to Psychology may be required if no improvement is seen as she continues in our clinic.  Snoring Pantera is scheduled for a Pulmonary assessment.  Obesity Kierstin is currently in the action stage of change and her goal is to continue  with weight loss efforts. I recommend Lamani begin the structured treatment plan as follows:  She has agreed to follow the Category 2 plan. Betti will work on meal planning, intentional eating, stop all sugary drinks, and decrease her portion sizes.  Marynell has been instructed to eventually work up to a goal of 150 minutes of combined cardio and strengthening exercise per week for weight loss and overall health  benefits. We discussed the following Behavioral Modification Strategies today: increasing lean protein intake, decreasing simple carbohydrates, increasing vegetables, increase H20 intake, decrease eating out, no skipping meals, work on meal planning and easy cooking plans, and keeping healthy foods in the home.   She was informed of the importance of frequent follow-up visits to maximize her success with intensive lifestyle modifications for her multiple health conditions. She was informed we would discuss her lab results at her next visit unless there is a critical issue that needs to be addressed sooner. Faduma agreed to keep her next visit at the agreed upon time to discuss these results.  ALLERGIES: Allergies  Allergen Reactions   Decongestant [Pseudoephedrine] Other (See Comments)    SI    Iodinated Diagnostic Agents Hives   Percocet [Oxycodone-Acetaminophen] Itching    MEDICATIONS: Current Outpatient Medications on File Prior to Visit  Medication Sig Dispense Refill   Multiple Vitamin (MULTIVITAMIN) tablet Take 1 tablet by mouth daily.     pantoprazole (PROTONIX) 40 MG tablet Take 1 tablet (40 mg total) by mouth daily. 30 tablet 1   No current facility-administered medications on file prior to visit.     PAST MEDICAL HISTORY: Past Medical History:  Diagnosis Date   Allergic rhinitis    Anxiety    Bartholin gland cyst 05/04/2013   GERD (gastroesophageal reflux disease)    Migraine without aura, without mention of intractable migraine without mention of status migrainosus 07/09/2013   Ovarian cyst 2014   Polyarthralgia    Sjogren's syndrome ([place])    Vaginal Pap smear, abnormal     PAST SURGICAL HISTORY: Past Surgical History:  Procedure Laterality Date   ADENOIDECTOMY     ESSURE TUBAL LIGATION  07-05-2014   THERAPEUTIC ABORTION     TONSILLECTOMY     TYMPANOSTOMY TUBE PLACEMENT      SOCIAL HISTORY: Social History   Tobacco Use   Smoking  status: Never Smoker   Smokeless tobacco: Never Used  Substance Use Topics   Alcohol use: Yes    Comment: 2/month   Drug use: No    FAMILY HISTORY: Family History  Problem Relation Age of Onset   Hypertension Mother    Sleep apnea Mother    Obesity Mother    Heart disease Father    Alcohol abuse Father        dying from this   Diabetes Father    High blood pressure Father    Anxiety disorder Father    AAA (abdominal aortic aneurysm) Father    Mental retardation Maternal Uncle    Diabetes Maternal Grandmother    Hypertension Maternal Grandmother    Kidney disease Maternal Grandmother    Heart disease Maternal Grandmother    Stroke Maternal Grandmother    Prostate cancer Maternal Grandfather    Alcohol abuse Other    Hypertension Paternal Grandmother    ROS: Review of Systems  Constitutional: Positive for malaise/fatigue.  HENT: Positive for tinnitus.        Positive for decreased hearing. Positive for ear drainage. Positive for nasal discharge. Positive for dry  mouth. Positive for hoarseness.  Eyes: Positive for redness.       Positive for wearing glasses or contacts. Positive for flashes of light. Positive for floaters.  Respiratory: Positive for shortness of breath (with activity).        Positive for difficulty breathing while lying down. Positive for sudden awakening from sleep with shortness of breath.  Cardiovascular: Positive for palpitations. Negative for orthopnea.  Gastrointestinal: Positive for constipation, heartburn and nausea. Negative for vomiting.       Positive for GERD.  Musculoskeletal: Positive for back pain, joint pain and neck pain.       Positive for polyarthralgias. Negative for muscle weakness.  Skin: Positive for rash (chest).  Neurological: Positive for dizziness and headaches (migraines with aura).       Positive for calf/leg pain with walking. Positive for leg cramping.  Endo/Heme/Allergies:       Positive for  very cold feet or hands. Positive for cold intolerance.  Psychiatric/Behavioral: Positive for depression (emotional eating). Negative for suicidal ideas.       Negative for homicidal ideas. Positive for stress.   PHYSICAL EXAM: Blood pressure 109/75, pulse 72, temperature 98.4 F (36.9 C), height 5\' 4"  (1.626 m), weight 183 lb (83 kg), last menstrual period 08/08/2019, SpO2 100 %. Body mass index is 31.41 kg/m. Physical Exam Vitals signs reviewed.  Constitutional:      Appearance: Normal appearance. She is well-developed. She is obese.  HENT:     Head: Normocephalic and atraumatic.     Nose: Nose normal.  Eyes:     General: No scleral icterus. Neck:     Musculoskeletal: Normal range of motion.  Cardiovascular:     Rate and Rhythm: Normal rate and regular rhythm.  Pulmonary:     Effort: Pulmonary effort is normal. No respiratory distress.  Abdominal:     Palpations: Abdomen is soft.     Tenderness: There is no abdominal tenderness.  Musculoskeletal: Normal range of motion.     Comments: Range of motion normal in all four extremities.  Skin:    General: Skin is warm and dry.  Neurological:     Mental Status: She is alert and oriented to person, place, and time.     Coordination: Coordination normal.  Psychiatric:        Mood and Affect: Mood and affect normal.        Behavior: Behavior normal.   RECENT LABS AND TESTS: BMET    Component Value Date/Time   NA 139 03/02/2019 1639   K 3.9 03/02/2019 1639   CL 103 03/02/2019 1639   CO2 23 03/02/2019 1639   GLUCOSE 86 03/02/2019 1639   BUN 15 03/02/2019 1639   CREATININE 0.88 03/02/2019 1639   CALCIUM 9.4 03/02/2019 1639   GFRNONAA >60 09/08/2018 0215   GFRAA >60 09/08/2018 0215   Lab Results  Component Value Date   HGBA1C 5.6 12/20/2018   No results found for: INSULIN CBC    Component Value Date/Time   WBC 6.7 03/02/2019 1639   RBC 4.17 03/02/2019 1639   HGB 12.7 03/02/2019 1639   HCT 37.6 03/02/2019 1639    PLT 352 03/02/2019 1639   MCV 90.2 03/02/2019 1639   MCH 30.5 03/02/2019 1639   MCHC 33.8 03/02/2019 1639   RDW 12.1 03/02/2019 1639   LYMPHSABS 2.8 12/20/2018 1547   MONOABS 0.4 12/20/2018 1547   EOSABS 0.1 12/20/2018 1547   BASOSABS 0.1 12/20/2018 1547   Iron/TIBC/Ferritin/ %Sat  Component Value Date/Time   IRON 112 03/02/2019 1639   TIBC 378 03/02/2019 1639   FERRITIN 35 03/02/2019 1639   IRONPCTSAT 30 03/02/2019 1639   Lipid Panel     Component Value Date/Time   CHOL 180 01/27/2018 1539   TRIG 95 01/27/2018 1539   HDL 53 01/27/2018 1539   CHOLHDL 3.4 01/27/2018 1539   LDLCALC 108 (H) 01/27/2018 1539   Hepatic Function Panel     Component Value Date/Time   PROT 7.4 03/02/2019 1639   ALBUMIN 4.2 12/20/2018 1547   AST 11 03/02/2019 1639   ALT 9 03/02/2019 1639   ALKPHOS 54 12/20/2018 1547   BILITOT 0.4 03/02/2019 1639      Component Value Date/Time   TSH 1.10 03/02/2019 1639   TSH 0.90 09/11/2018 0855   Results for RONNETTE, ASKELAND (MRN [id number]) as of 08/16/2019 10:06  Ref. Range 10/04/2018 16:32  VITD Latest Ref Range: 30.00 - 100.00 ng/mL 33.25   ECG shows sinus rhythm. Within normal limits.  INDIRECT CALORIMETER done today shows a VO2 of 231 and a REE of 1609.  Her calculated basal metabolic rate is [id number] thus her basal metabolic rate is better than expected.  OBESITY BEHAVIORAL INTERVENTION VISIT  Today's visit was #1  Starting weight: 183 lbs Starting date: 08/16/2019 Today's weight: 183 lbs  Today's date: 08/16/2019 Total lbs lost to date: 0     08/16/2019  Height 5\' 4"  (1.626 m)  Weight 183 lb (83 kg)  BMI (Calculated) 31.4  BLOOD PRESSURE - SYSTOLIC 0000000  BLOOD PRESSURE - DIASTOLIC 75  Waist Measurement  44 inches   Body Fat % 40.6 %  Total Body Water (lbs) 70.4 lbs  RMR 1609   ASK: We discussed the diagnosis of obesity with Franco Collet today and Meredeth agreed to give [name] permission to discuss obesity behavioral modification  therapy today.  ASSESS: Natori has the diagnosis of obesity and her BMI today is 31.4. Berta is in the action stage of change.   ADVISE: Julliet was educated on the multiple health risks of obesity as well as the benefit of weight loss to improve her health. She was advised of the need for long term treatment and the importance of lifestyle modifications to improve her current health and to decrease her risk of future health problems.  AGREE: Multiple dietary modification options and treatment options were discussed and  Kinza agreed to follow the recommendations documented in the above note.  ARRANGE: Staci was educated on the importance of frequent visits to treat obesity as outlined per CMS and USPSTF guidelines and agreed to schedule her next follow up appointment today.  Migdalia Dk, am acting as Location manager for [employer], DO  I have reviewed the above documentation for accuracy and completeness, and I agree with the above. -Jearld Lesch, DO

## 2019-08-17 LAB — LIPID PANEL WITH LDL/HDL RATIO
Cholesterol, Total: 183 mg/dL (ref 100–199)
HDL: 55 mg/dL (ref >39)
LDL Chol Calc (NIH): 112 mg/dL — ABNORMAL HIGH (ref 0–99)
LDL/HDL Ratio: 2 ratio (ref 0.0–3.2)
Triglycerides: 89 mg/dL (ref 0–149)
VLDL Cholesterol Cal: 16 mg/dL (ref 5–40)

## 2019-08-17 LAB — COMPREHENSIVE METABOLIC PANEL
ALT: 8 IU/L (ref 0–32)
AST: 13 IU/L (ref 0–40)
Albumin/Globulin Ratio: 1.2 (ref 1.2–2.2)
Albumin: 4 g/dL (ref 3.8–4.8)
Alkaline Phosphatase: 68 IU/L (ref 39–117)
BUN/Creatinine Ratio: 16 (ref 9–23)
BUN: 13 mg/dL (ref 6–20)
Bilirubin Total: 0.2 mg/dL (ref 0.0–1.2)
CO2: 25 mmol/L (ref 20–29)
Calcium: 9.5 mg/dL (ref 8.7–10.2)
Chloride: 102 mmol/L (ref 96–106)
Creatinine, Ser: 0.83 mg/dL (ref 0.57–1.00)
GFR calc Af Amer: 108 mL/min/{1.73_m2} (ref >59)
GFR calc non Af Amer: 94 mL/min/{1.73_m2} (ref >59)
Globulin, Total: 3.3 g/dL (ref 1.5–4.5)
Glucose: 82 mg/dL (ref 65–99)
Potassium: 4.5 mmol/L (ref 3.5–5.2)
Sodium: 140 mmol/L (ref 134–144)
Total Protein: 7.3 g/dL (ref 6.0–8.5)

## 2019-08-17 LAB — VITAMIN D 25 HYDROXY (VIT D DEFICIENCY, FRACTURES): Vit D, 25-Hydroxy: 20 ng/mL — ABNORMAL LOW (ref 30.0–100.0)

## 2019-08-17 LAB — INSULIN, RANDOM: INSULIN: 13.6 u[IU]/mL (ref 2.6–24.9)

## 2019-08-17 LAB — TSH: TSH: 1.05 u[IU]/mL (ref 0.450–4.500)

## 2019-08-17 LAB — HEMOGLOBIN A1C
Est. average glucose Bld gHb Est-mCnc: 111 mg/dL
Hgb A1c MFr Bld: 5.5 % (ref 4.8–5.6)

## 2019-08-17 LAB — T4, FREE: Free T4: 1.03 ng/dL (ref 0.82–1.77)

## 2019-08-17 LAB — T3: T3, Total: 119 ng/dL (ref 71–180)

## 2019-08-20 ENCOUNTER — Encounter (INDEPENDENT_AMBULATORY_CARE_PROVIDER_SITE_OTHER): Payer: Self-pay | Admitting: Bariatrics

## 2019-08-20 DIAGNOSIS — E559 Vitamin D deficiency, unspecified: Secondary | ICD-10-CM | POA: Insufficient documentation

## 2019-08-29 ENCOUNTER — Ambulatory Visit (INDEPENDENT_AMBULATORY_CARE_PROVIDER_SITE_OTHER): Payer: No Typology Code available for payment source | Admitting: Family Medicine

## 2019-08-29 ENCOUNTER — Encounter: Payer: Self-pay | Admitting: Family Medicine

## 2019-08-29 ENCOUNTER — Other Ambulatory Visit: Payer: Self-pay

## 2019-08-29 VITALS — BP 116/76 | HR 77 | Temp 98.2°F | Resp 12 | Ht 64.0 in | Wt 186.0 lb

## 2019-08-29 DIAGNOSIS — H9192 Unspecified hearing loss, left ear: Secondary | ICD-10-CM | POA: Diagnosis not present

## 2019-08-29 DIAGNOSIS — G43809 Other migraine, not intractable, without status migrainosus: Secondary | ICD-10-CM | POA: Diagnosis not present

## 2019-08-29 DIAGNOSIS — R42 Dizziness and giddiness: Secondary | ICD-10-CM

## 2019-08-29 MED ORDER — MECLIZINE HCL 25 MG PO TABS: 25.0000 mg | ORAL_TABLET | Freq: Three times a day (TID) | ORAL | 0 refills | Status: DC | PRN

## 2019-08-29 NOTE — Patient Instructions (Signed)
A few things to remember from today's visit:   Dizziness, nonspecific - Plan: meclizine (ANTIVERT) 25 MG tablet  Hearing loss of left ear, unspecified hearing loss type - Plan: Ambulatory referral to Audiology  Other migraine without status migrainosus, not intractable   Dizziness Dizziness is a common problem. It makes you feel unsteady or light-headed. You may feel like you are about to pass out (faint). Dizziness can lead to getting hurt if you stumble or fall. Dizziness can be caused by many things, including:  Medicines.  Not having enough water in your body (dehydration).  Illness. Follow these instructions at home: Eating and drinking   Drink enough fluid to keep your pee (urine) clear or pale yellow. This helps to keep you from getting dehydrated. Try to drink more clear fluids, such as water.  Do not drink alcohol.  Limit how much caffeine you drink or eat, if your doctor tells you to do that.  Limit how much salt (sodium) you drink or eat, if your doctor tells you to do that. Activity   Avoid making quick movements. ? When you stand up from sitting in a chair, steady yourself until you feel okay. ? In the morning, first sit up on the side of the bed. When you feel okay, stand slowly while you hold onto something. Do this until you know that your balance is fine.  If you need to stand in one place for a long time, move your legs often. Tighten and relax the muscles in your legs while you are standing.  Do not drive or use heavy machinery if you feel dizzy.  Avoid bending down if you feel dizzy. Place items in your home so you can reach them easily without leaning over. Lifestyle  Do not use any products that contain nicotine or tobacco, such as cigarettes and e-cigarettes. If you need help quitting, ask your doctor.  Try to lower your stress level. You can do this by using methods such as yoga or meditation. Talk with your doctor if you need help. General  instructions  Watch your dizziness for any changes.  Take over-the-counter and prescription medicines only as told by your doctor. Talk with your doctor if you think that you are dizzy because of a medicine that you are taking.  Tell a friend or a family member that you are feeling dizzy. If he or she notices any changes in your behavior, have this person call your doctor.  Keep all follow-up visits as told by your doctor. This is important. Contact a doctor if:  Your dizziness does not go away.  Your dizziness or light-headedness gets worse.  You feel sick to your stomach (nauseous).  You have trouble hearing.  You have new symptoms.  You are unsteady on your feet.  You feel like the room is spinning. Get help right away if:  You throw up (vomit) or have watery poop (diarrhea), and you cannot eat or drink anything.  You have trouble: ? Talking. ? Walking. ? Swallowing. ? Using your arms, hands, or legs.  You feel generally weak.  You are not thinking clearly, or you have trouble forming sentences. A friend or family member may notice this.  You have: ? Chest pain. ? Pain in your belly (abdomen). ? Shortness of breath. ? Sweating.  Your vision changes.  You are bleeding.  You have a very bad headache.  You have neck pain or a stiff neck.  You have a fever. These symptoms may  be an emergency. Do not wait to see if the symptoms will go away. Get medical help right away. Call your local emergency services (911 in the [place] Do not drive yourself to the hospital. Summary  Dizziness makes you feel unsteady or light-headed. You may feel like you are about to pass out (faint).  Drink enough fluid to keep your pee (urine) clear or pale yellow. Do not drink alcohol.  Avoid making quick movements if you feel dizzy.  Watch your dizziness for any changes. This information is not intended to replace advice given to you by your health care provider. Make sure you  discuss any questions you have with your health care provider. Document Released: 09/02/2011 Document Revised: 09/16/2017 Document Reviewed: 09/30/2016 Elsevier Patient Education  [place].  Please be sure medication list is accurate. If a new problem present, please set up appointment sooner than planned today.

## 2019-08-29 NOTE — Progress Notes (Signed)
ACUTE VISIT   HPI:  Chief Complaint  Patient presents with  . Dizziness    started 2 days ago    Veronica Raymond is a 33 y.o. female, who is here today complaining of 2 days of dizziness. Last night while she was standing up in her kitchen she felt like she was moving forward. Sensation of movement and occasionally spinning sensation. Exacerbated by getting up. She does not had episodes in bed. Episodes last about 2 min, occasionally associated diaphoresis. No CP,palpitations,dyspnea,N/V,or focal deficit.  Left hearing loss and tinnitus, this is chronic. Reports abnormal hearing test earlier this year.  She has been evaluated by ENT and according to pt, no further recommendations were given.  She has been eating and drinking fluids normally. No new medications. Denies anxiety,new stressors,or depression.  Lab Results  Component Value Date   WBC 6.7 03/02/2019   HGB 12.7 03/02/2019   HCT 37.6 03/02/2019   MCV 90.2 03/02/2019   PLT 352 03/02/2019   Lab Results  Component Value Date   CREATININE 0.83 08/16/2019   BUN 13 08/16/2019   NA 140 08/16/2019   K 4.5 08/16/2019   CL 102 08/16/2019   CO2 25 08/16/2019   No associated headache but she has had 2 episodes of migraine in the past 2 weeks. She has had migraines for years but was headache free for 4 years. Headache is like her typical migraine,preceeded by bright lights (aura) and numbness of right UE.  She has not identified exacerbating factors.  States that she has tried medications in the past but aggravated problem, so she applies cold compresses,takes a hot shower,and sleeps.  Normal brain MRI on 04/28/2019. She has been evaluated by neurologist because memory problems,paresthesias,and dizziness. Dr Posey Pronto on 03/28/19.   Review of Systems  Constitutional: Positive for fatigue. Negative for activity change, appetite change and fever.  HENT: Negative for ear discharge, ear pain, mouth sores and  sore throat.   Eyes: Negative for pain and redness.  Respiratory: Negative for cough and wheezing.   Cardiovascular: Negative for leg swelling.  Gastrointestinal: Negative for abdominal pain.       Negative for changes in bowel habits.  Genitourinary: Negative for decreased urine volume, dysuria and hematuria.  Allergic/Immunologic: Positive for environmental allergies.  Neurological: Negative for syncope, facial asymmetry and speech difficulty.  Psychiatric/Behavioral: Negative for confusion. The patient is nervous/anxious.   Rest see pertinent positives and negatives per HPI.   Current Outpatient Medications on File Prior to Visit  Medication Sig Dispense Refill  . Multiple Vitamin (MULTIVITAMIN) tablet Take 1 tablet by mouth daily.    . pantoprazole (PROTONIX) 40 MG tablet Take 1 tablet (40 mg total) by mouth daily. 30 tablet 1   No current facility-administered medications on file prior to visit.      Past Medical History:  Diagnosis Date  . Allergic rhinitis   . Anxiety   . Bartholin gland cyst 05/04/2013  . GERD (gastroesophageal reflux disease)   . Migraine without aura, without mention of intractable migraine without mention of status migrainosus 07/09/2013  . Ovarian cyst 2014  . Polyarthralgia   . Sjogren's syndrome ([place])   . Vaginal Pap smear, abnormal    Allergies  Allergen Reactions  . Decongestant [Pseudoephedrine] Other (See Comments)    SI   . Iodinated Diagnostic Agents Hives  . Percocet [Oxycodone-Acetaminophen] Itching    Social History   Socioeconomic History  . Marital status: Single  Spouse name: Not on file  . Number of children: 3  . Years of education: college  . Highest education level: Not on file  Occupational History  . 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 Financial resource strain: Not on file  . Food insecurity    Worry: Not on file    Inability: Not on file  .  Transportation needs    Medical: Not on file    Non-medical: Not on file  Tobacco Use  . Smoking status: Never Smoker  . Smokeless tobacco: Never Used  Substance and Sexual Activity  . Alcohol use: Yes    Comment: 2/month  . Drug use: No  . Sexual activity: Yes    Birth control/protection: Surgical  Lifestyle  . Physical activity    Days per week: Not on file    Minutes per session: Not on file  . Stress: Not on file  Relationships  . Social Herbalist on phone: Not on file    Gets together: Not on file    Attends religious service: Not on file    Active member of club or organization: Not on file    Attends meetings of clubs or organizations: Not on file    Relationship status: Not on file  Other Topics Concern  . Not on file  Social History Narrative   Lives with her 4 children      Lives in two story home      Right handed      Highest level of edu- [employer]       Vitals:   08/29/19 1648  BP: 116/76  Pulse: 77  Resp: 12  Temp: 98.2 F (36.8 C)  SpO2: 99%   Body mass index is 31.93 kg/m.   Physical Exam  Nursing note and vitals reviewed. Constitutional: She is oriented to person, place, and time. She appears well-developed. She does not appear ill. No distress.  HENT:  Head: Normocephalic and atraumatic.  Right Ear: Hearing, external ear and ear canal normal. Tympanic membrane is scarred.  Left Ear: Hearing, external ear and ear canal normal. Tympanic membrane is scarred.  Mouth/Throat: Oropharynx is clear and moist and mucous membranes are normal.  Apley maneuver negative,bilateral. Nystagmus not present.  Eyes: Pupils are equal, round, and reactive to light. Conjunctivae and EOM are normal.  Cardiovascular: Normal rate and regular rhythm.  No murmur heard. Respiratory: Effort normal and breath sounds normal. No respiratory distress.  GI: Soft. She exhibits no mass. There is no hepatomegaly. There is no abdominal tenderness.   Musculoskeletal:        General: No tenderness or edema.  Lymphadenopathy:    She has no cervical adenopathy.  Neurological: She is alert and oriented to person, place, and time. She has normal strength. No cranial nerve deficit. She displays a negative Romberg sign. Coordination and gait normal.  Pronator drift negative.  Skin: Skin is warm. No rash noted. No erythema.  Psychiatric: Her mood appears anxious.  Well groomed, good eye contact.    ASSESSMENT AND PLAN:  Veronica Raymond was seen today for dizziness.  Diagnoses and all orders for this visit:  Dizziness, nonspecific Possible etiologies discussed. Explained that history and examination today do not suggest a serious problem. ?  Vertigo but Apley maneuver negative today. Instructed about warning signs. I do not think blood work or imaging is needed at this time.  She  agrees with trying a trial of meclizine, side effect discussed. Fall precautions.  -     meclizine (ANTIVERT) 25 MG tablet; Take 1 tablet (25 mg total) by mouth 3 (three) times daily as needed for dizziness.  Hearing loss of left ear, unspecified hearing loss type Hearing screening here in the office in normal range. She would like ENT re-evaluation but I think at this time audiologist is more appropriate, she agrees. Audiology referral placed.  -     Ambulatory referral to Audiology  Other migraine without status migrainosus, not intractable Report trying different meds unsuccessfully. Continue current management. Keep a headache diary. If problem gets worse or migraine characteristics changed neuro evaluation will be arranged.   Return if symptoms worsen or fail to improve.    Kaniel Kiang G. [place], MD  [facility]. [place] office.

## 2019-08-30 ENCOUNTER — Other Ambulatory Visit: Payer: Self-pay

## 2019-08-30 ENCOUNTER — Ambulatory Visit (INDEPENDENT_AMBULATORY_CARE_PROVIDER_SITE_OTHER): Payer: No Typology Code available for payment source | Admitting: Bariatrics

## 2019-08-30 ENCOUNTER — Encounter (INDEPENDENT_AMBULATORY_CARE_PROVIDER_SITE_OTHER): Payer: Self-pay | Admitting: Bariatrics

## 2019-08-30 VITALS — BP 103/70 | HR 89 | Temp 99.0°F | Ht 64.0 in | Wt 177.0 lb

## 2019-08-30 DIAGNOSIS — Z6830 Body mass index (BMI) 30.0-30.9, adult: Secondary | ICD-10-CM

## 2019-08-30 DIAGNOSIS — E669 Obesity, unspecified: Secondary | ICD-10-CM

## 2019-08-30 DIAGNOSIS — E559 Vitamin D deficiency, unspecified: Secondary | ICD-10-CM

## 2019-08-30 DIAGNOSIS — Z9189 Other specified personal risk factors, not elsewhere classified: Secondary | ICD-10-CM

## 2019-08-30 DIAGNOSIS — E8881 Metabolic syndrome: Secondary | ICD-10-CM | POA: Diagnosis not present

## 2019-08-30 MED ORDER — VITAMIN D (ERGOCALCIFEROL) 1.25 MG (50000 UNIT) PO CAPS: 50000.0000 [IU] | ORAL_CAPSULE | ORAL | 0 refills | Status: DC

## 2019-09-03 ENCOUNTER — Encounter (INDEPENDENT_AMBULATORY_CARE_PROVIDER_SITE_OTHER): Payer: Self-pay | Admitting: Bariatrics

## 2019-09-03 NOTE — Progress Notes (Signed)
Office: [phone]  /  Fax: [phone]   HPI:   Chief Complaint: OBESITY Veronica Raymond is here to discuss her progress with her obesity treatment plan. She is on the Category 2 plan and is following her eating plan approximately 90% of the time. She states she is doing cardio/weightlifting 40 minutes 3 times per week. Veronica Raymond is down 6 lbs (lactose intolerant). She struggles with time overall. Her negatives are hard to eat if she is busy.  Her weight is 177 lb (80.3 kg) today and has had a weight loss of 6 pounds over a period of 2 weeks since her last visit. She has lost 6 lbs since starting treatment with [name].  Vitamin D deficiency Veronica Raymond has a diagnosis of Vitamin D deficiency. Last Vitamin D 20.0 on 08/16/2019. She is not currently taking Vit D and denies nausea, vomiting or muscle weakness.  At risk for osteopenia and osteoporosis Veronica Raymond is at higher risk of osteopenia and osteoporosis due to Vitamin D deficiency.   Insulin Resistance, Mild Veronica Raymond has a diagnosis of insulin resistance based on her elevated fasting insulin level >5. Last A1c 5.5 on 08/16/2019 with an insulin of 13.6. Although Veronica Raymond's blood glucose readings are still under good control, insulin resistance puts her at greater risk of metabolic syndrome and diabetes. She is not taking metformin currently and continues to work on diet and exercise to decrease risk of diabetes.  ASSESSMENT AND PLAN:  Vitamin D deficiency - Plan: Vitamin D, Ergocalciferol, (DRISDOL) 1.25 MG (50000 UT) CAPS capsule  Insulin resistance  At risk for osteoporosis  Class 1 obesity with serious comorbidity and body mass index (BMI) of 30.0 to 30.9 in adult, unspecified obesity type  PLAN:  Vitamin D Deficiency Veronica Raymond was informed that low Vitamin D levels contributes to fatigue and are associated with obesity, breast, and colon cancer. She agrees to start prescription Vit D @ 50,000 IU every week #4 with 0 refills and will follow-up for  routine testing of Vitamin D, at least 2-3 times per year. She was informed of the risk of over-replacement of Vitamin D and agrees to not increase her dose unless she discusses this with [name] first. Veronica Raymond agrees to follow-up with our clinic in 2-3 weeks.  At risk for osteopenia and osteoporosis Veronica Raymond was given extended  (15 minutes) osteoporosis prevention counseling today. Veronica Raymond is at risk for osteopenia and osteoporosis due to her Vitamin D deficiency. She was encouraged to take her Vitamin D and follow her higher calcium diet and increase strengthening exercise to help strengthen her bones and decrease her risk of osteopenia and osteoporosis.  Insulin Resistance, Mild Veronica Raymond will continue to work on weight loss, exercise, and decreasing simple carbohydrates in her diet to help decrease the risk of diabetes. Veronica Raymond will decrease carbohydrates, increase protein and healthy fats. Information was given.  Obesity Veronica Raymond is currently in the action stage of change. As such, her goal is to continue with weight loss efforts. She has agreed to follow the Category 2 plan alternating with the Pescatarian plan. We reviewed Veronica Raymond's labs (CMP, lipids, Vitamin D, A1c, insulin, and thyroid panel). She was given handout on Recipes. Veronica Raymond has been instructed to work up to a goal of 150 minutes of combined cardio and strengthening exercise per week for weight loss and overall health benefits. We discussed the following Behavioral Modification Strategies today: increasing lean protein intake, decreasing simple carbohydrates, increasing vegetables, increase H20 intake, decrease eating out, no skipping meals, work on meal planning and  easy cooking plans, keeping healthy foods in the home, and planning for success.  Veronica Raymond has agreed to follow-up with our clinic in 2-3 weeks. She was informed of the importance of frequent follow-up visits to maximize her success with intensive lifestyle modifications for her  multiple health conditions.  ALLERGIES: Allergies  Allergen Reactions  . Decongestant [Pseudoephedrine] Other (See Comments)    SI   . Iodinated Diagnostic Agents Hives  . Percocet [Oxycodone-Acetaminophen] Itching    MEDICATIONS: Current Outpatient Medications on File Prior to Visit  Medication Sig Dispense Refill  . meclizine (ANTIVERT) 25 MG tablet Take 1 tablet (25 mg total) by mouth 3 (three) times daily as needed for dizziness. 45 tablet 0  . Multiple Vitamin (MULTIVITAMIN) tablet Take 1 tablet by mouth daily.    . pantoprazole (PROTONIX) 40 MG tablet Take 1 tablet (40 mg total) by mouth daily. 30 tablet 1   No current facility-administered medications on file prior to visit.     PAST MEDICAL HISTORY: Past Medical History:  Diagnosis Date  . Allergic rhinitis   . Anxiety   . Bartholin gland cyst 05/04/2013  . GERD (gastroesophageal reflux disease)   . Migraine without aura, without mention of intractable migraine without mention of status migrainosus 07/09/2013  . Ovarian cyst 2014  . Polyarthralgia   . Sjogren's syndrome ([place])   . Vaginal Pap smear, abnormal     PAST SURGICAL HISTORY: Past Surgical History:  Procedure Laterality Date  . ADENOIDECTOMY    . ESSURE TUBAL LIGATION  07-05-2014  . THERAPEUTIC ABORTION    . TONSILLECTOMY    . TYMPANOSTOMY TUBE PLACEMENT      SOCIAL HISTORY: Social History   Tobacco Use  . Smoking status: Never Smoker  . Smokeless tobacco: Never Used  Substance Use Topics  . Alcohol use: Yes    Comment: 2/month  . Drug use: No    FAMILY HISTORY: Family History  Problem Relation Age of Onset  . Hypertension Mother   . Sleep apnea Mother   . Obesity Mother   . Heart disease Father   . Alcohol abuse Father        dying from this  . Diabetes Father   . High blood pressure Father   . Anxiety disorder Father   . AAA (abdominal aortic aneurysm) Father   . Mental retardation Maternal Uncle   . Diabetes Maternal  Grandmother   . Hypertension Maternal Grandmother   . Kidney disease Maternal Grandmother   . Heart disease Maternal Grandmother   . Stroke Maternal Grandmother   . Prostate cancer Maternal Grandfather   . Alcohol abuse Other   . Hypertension Paternal Grandmother    ROS: Review of Systems  Gastrointestinal: Negative for nausea and vomiting.  Musculoskeletal:       Negative for muscle weakness.   PHYSICAL EXAM: Blood pressure 103/70, pulse 89, temperature 99 F (37.2 C), temperature source Oral, height 5\' 4"  (1.626 m), weight 177 lb (80.3 kg), last menstrual period 08/08/2019, SpO2 99 %. Body mass index is 30.38 kg/m. Physical Exam Vitals signs reviewed.  Constitutional:      Appearance: Normal appearance. She is obese.  Cardiovascular:     Rate and Rhythm: Normal rate.     Pulses: Normal pulses.  Pulmonary:     Effort: Pulmonary effort is normal.     Breath sounds: Normal breath sounds.  Musculoskeletal: Normal range of motion.  Skin:    General: Skin is warm and dry.  Neurological:  Mental Status: She is alert and oriented to person, place, and time.  Psychiatric:        Behavior: Behavior normal.   RECENT LABS AND TESTS: BMET    Component Value Date/Time   NA 140 08/16/2019 0934   K 4.5 08/16/2019 0934   CL 102 08/16/2019 0934   CO2 25 08/16/2019 0934   GLUCOSE 82 08/16/2019 0934   GLUCOSE 86 03/02/2019 1639   BUN 13 08/16/2019 0934   CREATININE 0.83 08/16/2019 0934   CREATININE 0.88 03/02/2019 1639   CALCIUM 9.5 08/16/2019 0934   GFRNONAA 94 08/16/2019 0934   GFRAA 108 08/16/2019 0934   Lab Results  Component Value Date   HGBA1C 5.5 08/16/2019   HGBA1C 5.6 12/20/2018   Lab Results  Component Value Date   INSULIN 13.6 08/16/2019   CBC    Component Value Date/Time   WBC 6.7 03/02/2019 1639   RBC 4.17 03/02/2019 1639   HGB 12.7 03/02/2019 1639   HCT 37.6 03/02/2019 1639   PLT 352 03/02/2019 1639   MCV 90.2 03/02/2019 1639   MCH 30.5  03/02/2019 1639   MCHC 33.8 03/02/2019 1639   RDW 12.1 03/02/2019 1639   LYMPHSABS 2.8 12/20/2018 1547   MONOABS 0.4 12/20/2018 1547   EOSABS 0.1 12/20/2018 1547   BASOSABS 0.1 12/20/2018 1547   Iron/TIBC/Ferritin/ %Sat    Component Value Date/Time   IRON 112 03/02/2019 1639   TIBC 378 03/02/2019 1639   FERRITIN 35 03/02/2019 1639   IRONPCTSAT 30 03/02/2019 1639   Lipid Panel     Component Value Date/Time   CHOL 183 08/16/2019 0934   TRIG 89 08/16/2019 0934   HDL 55 08/16/2019 0934   CHOLHDL 3.4 01/27/2018 1539   LDLCALC 112 (H) 08/16/2019 0934   LDLCALC 108 (H) 01/27/2018 1539   Hepatic Function Panel     Component Value Date/Time   PROT 7.3 08/16/2019 0934   ALBUMIN 4.0 08/16/2019 0934   AST 13 08/16/2019 0934   ALT 8 08/16/2019 0934   ALKPHOS 68 08/16/2019 0934   BILITOT 0.2 08/16/2019 0934      Component Value Date/Time   TSH 1.050 08/16/2019 0934   TSH 1.10 03/02/2019 1639   TSH 0.90 09/11/2018 0855   Results for CAILY, VASILAKIS (MRN [id number]) as of 09/03/2019 11:15  Ref. Range 08/16/2019 09:34  Vitamin D, 25-Hydroxy Latest Ref Range: 30.0 - 100.0 ng/mL 20.0 (L)   OBESITY BEHAVIORAL INTERVENTION VISIT  Today's visit was #2  Starting weight: 183 lbs Starting date: 08/16/2019 Today's weight: 177 lbs   Today's date: 08/30/2019 Total lbs lost to date: 6     08/30/2019  Height 5\' 4"  (1.626 m)  Weight 177 lb (80.3 kg)  BMI (Calculated) 30.37  BLOOD PRESSURE - SYSTOLIC [id number]  BLOOD PRESSURE - DIASTOLIC 70   Body Fat % 39 %  Total Body Water (lbs) 70.2 lbs   ASK: We discussed the diagnosis of obesity with Veronica Raymond today and Veronica Raymond agreed to give [name] permission to discuss obesity behavioral modification therapy today.  ASSESS: Veronica Raymond has the diagnosis of obesity and her BMI today is 30.5. Veronica Raymond is in the action stage of change.   ADVISE: Veronica Raymond was educated on the multiple health risks of obesity as well as the benefit of weight loss to  improve her health. She was advised of the need for long term treatment and the importance of lifestyle modifications to improve her current health and to decrease her risk of  future health problems.  AGREE: Multiple dietary modification options and treatment options were discussed and  Arron agreed to follow the recommendations documented in the above note.  ARRANGE: Toleen was educated on the importance of frequent visits to treat obesity as outlined per CMS and USPSTF guidelines and agreed to schedule her next follow up appointment today.  Veronica Raymond Dk, am acting as Location manager for [employer], DO  I have reviewed the above documentation for accuracy and completeness, and I agree with the above. -Jearld Lesch, DO

## 2019-09-13 ENCOUNTER — Ambulatory Visit: Payer: No Typology Code available for payment source | Admitting: Pulmonary Disease

## 2019-09-13 ENCOUNTER — Other Ambulatory Visit: Payer: Self-pay

## 2019-09-13 ENCOUNTER — Encounter: Payer: Self-pay | Admitting: Pulmonary Disease

## 2019-09-13 VITALS — BP 128/66 | HR 75 | Ht 64.0 in | Wt 185.0 lb

## 2019-09-13 DIAGNOSIS — G4733 Obstructive sleep apnea (adult) (pediatric): Secondary | ICD-10-CM

## 2019-09-13 DIAGNOSIS — R400 Somnolence: Secondary | ICD-10-CM | POA: Diagnosis not present

## 2019-09-13 NOTE — Patient Instructions (Addendum)
Moderate probability of significant sleep disordered breathing  We will get a home sleep study CPAP will be an option of treatment  Continue weight loss efforts  We will see you in about 3 months Sleep Apnea Sleep apnea is a condition in which breathing pauses or becomes shallow during sleep. Episodes of sleep apnea usually last 10 seconds or longer, and they may occur as many as 20 times an hour. Sleep apnea disrupts your sleep and keeps your body from getting the rest that it needs. This condition can increase your risk of certain health problems, including:  Heart attack.  Stroke.  Obesity.  Diabetes.  Heart failure.  Irregular heartbeat. What are the causes? There are three kinds of sleep apnea:  Obstructive sleep apnea. This kind is caused by a blocked or collapsed airway.  Central sleep apnea. This kind happens when the part of the brain that controls breathing does not send the correct signals to the muscles that control breathing.  Mixed sleep apnea. This is a combination of obstructive and central sleep apnea. The most common cause of this condition is a collapsed or blocked airway. An airway can collapse or become blocked if:  Your throat muscles are abnormally relaxed.  Your tongue and tonsils are larger than normal.  You are overweight.  Your airway is smaller than normal. What increases the risk? You are more likely to develop this condition if you:  Are overweight.  Smoke.  Have a smaller than normal airway.  Are elderly.  Are female.  Drink alcohol.  Take sedatives or tranquilizers.  Have a family history of sleep apnea. What are the signs or symptoms? Symptoms of this condition include:  Trouble staying asleep.  Daytime sleepiness and tiredness.  Irritability.  Loud snoring.  Morning headaches.  Trouble concentrating.  Forgetfulness.  Decreased interest in sex.  Unexplained sleepiness.  Mood swings.  Personality  changes.  Feelings of depression.  Waking up often during the night to urinate.  Dry mouth.  Sore throat. How is this diagnosed? This condition may be diagnosed with:  A medical history.  A physical exam.  A series of tests that are done while you are sleeping (sleep study). These tests are usually done in a sleep lab, but they may also be done at home. How is this treated? Treatment for this condition aims to restore normal breathing and to ease symptoms during sleep. It may involve managing health issues that can affect breathing, such as high blood pressure or obesity. Treatment may include:  Sleeping on your side.  Using a decongestant if you have nasal congestion.  Avoiding the use of depressants, including alcohol, sedatives, and narcotics.  Losing weight if you are overweight.  Making changes to your diet.  Quitting smoking.  Using a device to open your airway while you sleep, such as: ? An oral appliance. This is a custom-made mouthpiece that shifts your lower jaw forward. ? A continuous positive airway pressure (CPAP) device. This device blows air through a mask when you breathe out (exhale). ? A nasal expiratory positive airway pressure (EPAP) device. This device has valves that you put into each nostril. ? A bi-level positive airway pressure (BPAP) device. This device blows air through a mask when you breathe in (inhale) and breathe out (exhale).  Having surgery if other treatments do not work. During surgery, excess tissue is removed to create a wider airway. It is important to get treatment for sleep apnea. Without treatment, this condition can lead  to:  High blood pressure.  Coronary artery disease.  In men, an inability to achieve or maintain an erection (impotence).  Reduced thinking abilities. Follow these instructions at home: Lifestyle  Make any lifestyle changes that your health care provider recommends.  Eat a healthy, well-balanced  diet.  Take steps to lose weight if you are overweight.  Avoid using depressants, including alcohol, sedatives, and narcotics.  Do not use any products that contain nicotine or tobacco, such as cigarettes, e-cigarettes, and chewing tobacco. If you need help quitting, ask your health care provider. General instructions  Take over-the-counter and prescription medicines only as told by your health care provider.  If you were given a device to open your airway while you sleep, use it only as told by your health care provider.  If you are having surgery, make sure to tell your health care provider you have sleep apnea. You may need to bring your device with you.  Keep all follow-up visits as told by your health care provider. This is important. Contact a health care provider if:  The device that you received to open your airway during sleep is uncomfortable or does not seem to be working.  Your symptoms do not improve.  Your symptoms get worse. Get help right away if:  You develop: ? Chest pain. ? Shortness of breath. ? Discomfort in your back, arms, or stomach.  You have: ? Trouble speaking. ? Weakness on one side of your body. ? Drooping in your face. These symptoms may represent a serious problem that is an emergency. Do not wait to see if the symptoms will go away. Get medical help right away. Call your local emergency services (911 in the [place] Do not drive yourself to the hospital. Summary  Sleep apnea is a condition in which breathing pauses or becomes shallow during sleep.  The most common cause is a collapsed or blocked airway.  The goal of treatment is to restore normal breathing and to ease symptoms during sleep. This information is not intended to replace advice given to you by your health care provider. Make sure you discuss any questions you have with your health care provider. Document Released: 09/03/2002 Document Revised: 06/30/2018 Document Reviewed:  05/09/2018 Elsevier Patient Education  2020 [employer].

## 2019-09-13 NOTE — Progress Notes (Signed)
Subjective:    Patient ID: Veronica Raymond, female    DOB: 18-Dec-1985, 33 y.o.   MRN: [id number]  Patient being seen for morning headaches, dizziness after waking up in the morning  She was diagnosed with obstructive sleep apnea as a child and did have adenotonsillectomy  Recently been told about increased snoring No witnessed apneas  Usually goes to bed between 10 PM and 12 AM Falls asleep in about 30 minutes Usually wakes up about once Final awakening time about 6:45 in the morning  Admits to dryness of the mouth in the mornings She has headache around-the-clock but worse in the mornings No difficulty concentrating Memory is good  Does not smoke Social use of alcohol  Mom does snore  Past Medical History:  Diagnosis Date  . Allergic rhinitis   . Anxiety   . Bartholin gland cyst 05/04/2013  . GERD (gastroesophageal reflux disease)   . Migraine without aura, without mention of intractable migraine without mention of status migrainosus 07/09/2013  . Ovarian cyst 2014  . Polyarthralgia   . Sjogren's syndrome ([place])   . Vaginal Pap smear, abnormal    Social History   Socioeconomic History  . Marital status: Single    Spouse name: Not on file  . Number of children: 3  . Years of education: college  . Highest education level: Not on file  Occupational History  . Occupation: CMA    Employer: [employer]    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[employer]  . Smoking status: Never Smoker  . Smokeless tobacco: Never Used  Substance and Sexual Activity  . Alcohol use: Yes    Comment: 2/month  . Drug use: No  . Sexual activity: Yes    Birth control/protection: Surgical  Other Topics Concern  . Not on file  Social History Narrative   Lives with her 4 children      Lives in two story home      Right handed      Highest level of edu- [employer]      Social Determinants of Health   Financial Resource Strain:   . Difficulty of Paying Living Expenses: Not on file   Food Insecurity:   . Worried About Charity fundraiser in the Last Year: Not on file  . Ran Out of Food in the Last Year: Not on file  Transportation Needs:   . Lack of Transportation (Medical): Not on file  . Lack of Transportation (Non-Medical): Not on file  Physical Activity:   . Days of Exercise per Week: Not on file  . Minutes of Exercise per Session: Not on file  Stress:   . Feeling of Stress : Not on file  Social Connections:   . Frequency of Communication with Friends and Family: Not on file  . Frequency of Social Gatherings with Friends and Family: Not on file  . Attends Religious Services: Not on file  . Active Member of Clubs or Organizations: Not on file  . Attends Archivist Meetings: Not on file  . Marital Status: Not on file  Intimate Partner Violence:   . Fear of Current or Ex-Partner: Not on file  . Emotionally Abused: Not on file  . Physically Abused: Not on file  . Sexually Abused: Not on file   Family History  Problem Relation Age of Onset  . Hypertension Mother   . Sleep apnea Mother   . Obesity Mother   . Heart disease Father   .  Alcohol abuse Father        dying from this  . Diabetes Father   . High blood pressure Father   . Anxiety disorder Father   . AAA (abdominal aortic aneurysm) Father   . Mental retardation Maternal Uncle   . Diabetes Maternal Grandmother   . Hypertension Maternal Grandmother   . Kidney disease Maternal Grandmother   . Heart disease Maternal Grandmother   . Stroke Maternal Grandmother   . Prostate cancer Maternal Grandfather   . Alcohol abuse Other   . Hypertension Paternal Grandmother    Review of Systems  Constitutional: Negative for fever and unexpected weight change.  HENT: Positive for dental problem. Negative for congestion, ear pain, nosebleeds, postnasal drip, rhinorrhea, sinus pressure, sneezing, sore throat and trouble swallowing.   Eyes: Negative for redness and itching.  Respiratory: Negative for  cough, chest tightness, shortness of breath and wheezing.   Cardiovascular: Negative for palpitations and leg swelling.  Gastrointestinal: Negative for nausea and vomiting.  Genitourinary: Negative for dysuria.  Musculoskeletal: Negative for joint swelling.  Skin: Negative for rash.  Allergic/Immunologic: Negative.  Negative for environmental allergies, food allergies and immunocompromised state.  Neurological: Positive for headaches.  Hematological: Does not bruise/bleed easily.  Psychiatric/Behavioral: Negative for dysphoric mood. The patient is not nervous/anxious.       Objective:   Physical Exam Vitals:   09/13/19 1503  BP: 128/66  Pulse: 75  SpO2: 99%   Results of the Epworth flowsheet 09/13/2019  Sitting and reading 3  Watching TV 2  Sitting, inactive in a public place (e.g. a theatre or a meeting) 0  As a passenger in a car for an hour without a break 3  Lying down to rest in the afternoon when circumstances permit 3  Sitting and talking to someone 0  Sitting quietly after a lunch without alcohol 3  In a car, while stopped for a few minutes in traffic 0  Total score 14      Assessment & Plan:  .  Moderate probability of significant obstructive sleep apnea -This may be contributing to her headaches -May be contributing to nonrestorative sleep  Excessive daytime sleepiness -Likely related to obstructive sleep apnea  Obesity -Encouraged weight loss efforts  Pathophysiology of sleep disordered breathing discussed  Treatment options for sleep disordered breathing discussed  Plan:  Will schedule the patient for a home sleep study  Treatment options as discussed  Will see her back in the office in about 3 months  Encouraged to call with any significant symptoms

## 2019-09-14 ENCOUNTER — Other Ambulatory Visit (HOSPITAL_COMMUNITY)
Admission: RE | Admit: 2019-09-14 | Discharge: 2019-09-14 | Disposition: A | Discharge status: Home / Self Care | Payer: No Typology Code available for payment source | Source: Ambulatory Visit | Attending: Family Medicine | Admitting: Family Medicine

## 2019-09-14 ENCOUNTER — Encounter: Payer: Self-pay | Admitting: Family Medicine

## 2019-09-14 ENCOUNTER — Ambulatory Visit (INDEPENDENT_AMBULATORY_CARE_PROVIDER_SITE_OTHER): Payer: No Typology Code available for payment source | Admitting: Family Medicine

## 2019-09-14 VITALS — BP 118/70 | HR 73 | Temp 96.0°F | Ht 64.0 in | Wt 189.0 lb

## 2019-09-14 DIAGNOSIS — Z113 Encounter for screening for infections with a predominantly sexual mode of transmission: Secondary | ICD-10-CM | POA: Insufficient documentation

## 2019-09-14 DIAGNOSIS — R202 Paresthesia of skin: Secondary | ICD-10-CM

## 2019-09-14 DIAGNOSIS — R221 Localized swelling, mass and lump, neck: Secondary | ICD-10-CM | POA: Diagnosis not present

## 2019-09-14 DIAGNOSIS — R200 Anesthesia of skin: Secondary | ICD-10-CM | POA: Diagnosis not present

## 2019-09-14 DIAGNOSIS — M545 Low back pain, unspecified: Secondary | ICD-10-CM

## 2019-09-14 DIAGNOSIS — M79606 Pain in leg, unspecified: Secondary | ICD-10-CM | POA: Diagnosis not present

## 2019-09-14 MED ORDER — PREDNISONE 20 MG PO TABS: ORAL_TABLET | ORAL | 0 refills | Status: AC

## 2019-09-14 NOTE — Patient Instructions (Signed)
A few things to remember from today's visit:   Bilateral low back pain, unspecified chronicity, unspecified whether sciatica present  Pain of left lower extremity - Plan: predniSONE (DELTASONE) 20 MG tablet  Neck nodule - Plan: CBC with Differential/Platelet  Screen for STD (sexually transmitted disease) - Plan: Urine cytology ancillary only, RPR, HIV antibody (with reflex)  ? Radicular pain left leg. Lesion in neck : I am not sure if it is a lymph node. ? Sebaceous cyst,lipoma  Please be sure medication list is accurate. If a new problem present, please set up appointment sooner than planned today.

## 2019-09-14 NOTE — Progress Notes (Signed)
ACUTE VISIT   HPI:  Chief Complaint  Patient presents with  . Leg Pain    [name] is a 33 y.o. female, who is here today complaining of 2 days of left lower back pain radiated to LLE.  Sharp pain, constant,10/10,and associated tingling sensation. Pain is interfering with sleep. No saddle anesthesia, local weakness,or bladder/bowel dysfunction.  Negative for fever,chills,night sweats,sore throat,or oral lesions. She has had wt loss because she is exercising regularly.  2 years Hx of intermittent back pain.  She has taken Aleve and has applied local heat,the latter helps.  Also c/o bilateral pretibial area,soreness since she started exercising. No skin changes,edema,or erythema. It did not improve with massage or with stopping exercising. Recently 25 OH vit D was done and low at 20.0 (08/16/19). She is already on vit D supplementation.  -Left side cervical and inguinal lymphadenopathy for the past few days. She is afraid of having osteomyelitis. She has not noted local edema or ulcer. No Hx of trauma. Similar problem 2 years ago.  Negative for abdominal pain,N/V,vaginal discharge or bleeding,or urinary symptom. No known STD exposure.   Review of Systems  Constitutional: Positive for fatigue (No more than usual). Negative for activity change and appetite change.  HENT: Negative for mouth sores, nosebleeds and sore throat.   Eyes: Negative for redness and visual disturbance.  Respiratory: Negative for cough, shortness of breath and wheezing.   Cardiovascular: Negative for chest pain, palpitations and leg swelling.  Gastrointestinal:       Negative for changes in bowel habits.  Genitourinary: Negative for decreased urine volume, dysuria and hematuria.  Musculoskeletal: Negative for gait problem.  Skin: Negative for pallor and wound.  Neurological: Negative for syncope, facial asymmetry and headaches.  Psychiatric/Behavioral: Positive for sleep  disturbance. Negative for confusion.  Rest see pertinent positives and negatives per HPI.   Current Outpatient Medications on File Prior to Visit  Medication Sig Dispense Refill  . meclizine (ANTIVERT) 25 MG tablet Take 1 tablet (25 mg total) by mouth 3 (three) times daily as needed for dizziness. 45 tablet 0  . Multiple Vitamin (MULTIVITAMIN) tablet Take 1 tablet by mouth daily.    . pantoprazole (PROTONIX) 40 MG tablet Take 1 tablet (40 mg total) by mouth daily. 30 tablet 1  . Vitamin D, Ergocalciferol, (DRISDOL) 1.25 MG (50000 UT) CAPS capsule Take 1 capsule (50,000 Units total) by mouth every 7 (seven) days. 4 capsule 0   No current facility-administered medications on file prior to visit.    Past Medical History:  Diagnosis Date  . Allergic rhinitis   . Anxiety   . Bartholin gland cyst 05/04/2013  . GERD (gastroesophageal reflux disease)   . Migraine without aura, without mention of intractable migraine without mention of status migrainosus 07/09/2013  . Ovarian cyst 2014  . Polyarthralgia   . Sjogren's syndrome ([place])   . Vaginal Pap smear, abnormal    Allergies  Allergen Reactions  . Decongestant [Pseudoephedrine] Other (See Comments)    SI   . Iodinated Diagnostic Agents Hives  . Percocet [Oxycodone-Acetaminophen] Itching    Social History   Socioeconomic History  . Marital status: Single    Spouse name: Not on file  . Number of children: 3  . Years of education: college  . Highest education level: Not on file  Occupational History  . 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 Smoking  status: Never Smoker  . Smokeless tobacco: Never Used  Substance and Sexual Activity  . Alcohol use: Yes    Comment: 2/month  . Drug use: No  . Sexual activity: Yes    Birth control/protection: Surgical  Other Topics Concern  . Not on file  Social History Narrative   Lives with her 4 children      Lives in two story home        Right handed      Highest level of edu- [employer]      Social Determinants of Health   Financial Resource Strain:   . Difficulty of Paying Living Expenses: Not on file  Food Insecurity:   . Worried About Charity fundraiser in the Last Year: Not on file  . Ran Out of Food in the Last Year: Not on file  Transportation Needs:   . Lack of Transportation (Medical): Not on file  . Lack of Transportation (Non-Medical): Not on file  Physical Activity:   . Days of Exercise per Week: Not on file  . Minutes of Exercise per Session: Not on file  Stress:   . Feeling of Stress : Not on file  Social Connections:   . Frequency of Communication with Friends and Family: Not on file  . Frequency of Social Gatherings with Friends and Family: Not on file  . Attends Religious Services: Not on file  . Active Member of Clubs or Organizations: Not on file  . Attends Archivist Meetings: Not on file  . Marital Status: Not on file    Vitals:   09/14/19 0957  BP: 118/70  Pulse: 73  Temp: (!) 96 F (35.6 C)  SpO2: 96%   Body mass index is 32.44 kg/m.   Physical Exam  Nursing note and vitals reviewed. Constitutional: She is oriented to person, place, and time. She appears well-developed. No distress.  HENT:  Head: Normocephalic and atraumatic.  Mouth/Throat: Oropharynx is clear and moist and mucous membranes are normal.  Eyes: Pupils are equal, round, and reactive to light. Conjunctivae are normal.  Neck:    Small rounded lesion palpated in left cervical area. No skin changes and no tenderness. Lesion is mobile and with defined borders.  Cardiovascular: Normal rate and regular rhythm.  No murmur heard. Pulses:      Dorsalis pedis pulses are 2+ on the right side and 2+ on the left side.  Respiratory: Effort normal and breath sounds normal. No respiratory distress.  GI: Soft. She exhibits no mass. There is no hepatomegaly. There is no abdominal tenderness.   Musculoskeletal:        General: No edema.     Lumbar back: Tenderness present. No bony tenderness.       Back:  Lymphadenopathy:       Right cervical: No deep cervical adenopathy present.      Left cervical: No deep cervical adenopathy present.       Right: No inguinal adenopathy present.       Left: No inguinal adenopathy present.  Neurological: She is alert and oriented to person, place, and time. She has normal strength. No cranial nerve deficit. Gait normal.  Skin: Skin is warm. No rash noted. No erythema.  Psychiatric: Her mood appears anxious.  Well groomed, good eye contact.    ASSESSMENT AND PLAN:  Ms. Deziray was seen today for leg pain.  Diagnoses and all orders for this visit:  Numbness and tingling of left lower extremity Examination today and  Hx do not suggest a serious process. She has seen neuro for RLE and UE tingling. ? Radicular pain. After discussion of some side effects she agrees with trying Prednisone taper.  -     predniSONE (DELTASONE) 20 MG tablet; 3 tabs for 3 days, 2 tabs for 3 days, 1 tabs for 3 days, and 1/2 tab for 3 days. Take tables together with breakfast.  Bilateral low back pain, unspecified chronicity, unspecified whether sciatica present Lumbar X ray in 05/2019 negative otherwise. I do not think further imaging is needed at this time but is still having LLE tingling in 4-6 weeks, lumbar MRI needs to be considered.  Leg pain, anterior, unspecified laterality Pretibial, bilateral. Examination negative for edema or erythema. Vit D def could cause shin pain. At this time I do not appreciate signs that suggest a serious process. ? Fibromyalgia is also to considered.  Neck nodule I am not sure this is a adenopathy or a benign lesion (?lipoma or sebaceous cyst). For now I recommend monitoring for growth.  -     CBC with Differential/Platelet  Screen for STD (sexually transmitted disease) -     Urine cytology ancillary only -     RPR -      HIV antibody (with reflex)   Return if symptoms worsen or fail to improve.    Juelz Claar G. [place], MD  [facility]. [place] office.

## 2019-09-15 ENCOUNTER — Encounter: Payer: Self-pay | Admitting: Family Medicine

## 2019-09-17 LAB — URINE CYTOLOGY ANCILLARY ONLY
Chlamydia: NEGATIVE
Comment: NEGATIVE
Comment: NEGATIVE
Comment: NORMAL
Neisseria Gonorrhea: NEGATIVE
Trichomonas: NEGATIVE

## 2019-09-17 LAB — RPR: RPR Ser Ql: NONREACTIVE

## 2019-09-17 LAB — HIV ANTIBODY (ROUTINE TESTING W REFLEX): HIV 1&2 Ab, 4th Generation: NONREACTIVE

## 2019-09-18 ENCOUNTER — Encounter: Payer: Self-pay | Admitting: Family Medicine

## 2019-09-27 ENCOUNTER — Other Ambulatory Visit (INDEPENDENT_AMBULATORY_CARE_PROVIDER_SITE_OTHER): Payer: Self-pay | Admitting: Bariatrics

## 2019-09-27 DIAGNOSIS — E559 Vitamin D deficiency, unspecified: Secondary | ICD-10-CM

## 2019-10-04 ENCOUNTER — Ambulatory Visit (INDEPENDENT_AMBULATORY_CARE_PROVIDER_SITE_OTHER): Payer: No Typology Code available for payment source | Admitting: Bariatrics

## 2019-11-08 ENCOUNTER — Other Ambulatory Visit: Payer: Self-pay

## 2019-11-08 ENCOUNTER — Ambulatory Visit (INDEPENDENT_AMBULATORY_CARE_PROVIDER_SITE_OTHER): Payer: No Typology Code available for payment source | Admitting: Adult Health

## 2019-11-08 ENCOUNTER — Telehealth: Payer: Self-pay | Admitting: Pulmonary Disease

## 2019-11-08 ENCOUNTER — Encounter: Payer: Self-pay | Admitting: Adult Health

## 2019-11-08 ENCOUNTER — Other Ambulatory Visit: Payer: Self-pay | Admitting: Adult Health

## 2019-11-08 DIAGNOSIS — G43809 Other migraine, not intractable, without status migrainosus: Secondary | ICD-10-CM

## 2019-11-08 DIAGNOSIS — R400 Somnolence: Secondary | ICD-10-CM

## 2019-11-08 DIAGNOSIS — R0683 Snoring: Secondary | ICD-10-CM

## 2019-11-08 DIAGNOSIS — G4733 Obstructive sleep apnea (adult) (pediatric): Secondary | ICD-10-CM

## 2019-11-08 MED ORDER — AMITRIPTYLINE HCL 10 MG PO TABS: 10.0000 mg | ORAL_TABLET | Freq: Every day | ORAL | 0 refills | Status: DC

## 2019-11-08 MED FILL — AMITRIPTYLINE HCL 10 MG TAB: 10 | 30 days supply | Qty: 30 | Fill #0

## 2019-11-08 NOTE — Telephone Encounter (Signed)
Looks like we are waitinng on the authorization, I let pt know once it is authorized, I will contact her to schedule an appt. Pt verbalized understanding & nothing further needed at this time.

## 2019-11-08 NOTE — Progress Notes (Signed)
Subjective:    Patient ID: Veronica Raymond, female    DOB: March 14, 1986, 34 y.o.   MRN: [id number]  HPI 34 year old female who  has a past medical history of Allergic rhinitis, Anxiety, Bartholin gland cyst (05/04/2013), GERD (gastroesophageal reflux disease), Migraine without aura, without mention of intractable migraine without mention of status migrainosus (07/09/2013), Ovarian cyst (2014), Polyarthralgia, Sjogren's syndrome ([place]), and Vaginal Pap smear, abnormal.   She is being evaluated today for migraine headache.  She has a history of migraine headaches starting in her late teenage years.  In the past has been tried on Topamax and Imitrex but found this to be a trigger for her migraines.  She has kept a migraine diary over the years and has been able to find specific triggers; she has stayed away from these triggers but over the last 2 to 3 weeks she has been having more chronic migraines, multiple in 1 week.  She has been taking over-the-counter ibuprofen without much relief.  Does endorse aura with her migraines.  Was recently evaluated at her eye doctor and changed her prescription, come to find out this was not the right prescription and she had the eyeglasses replaced last week but continues to have migraines.  She was also referred to pulmonary by her PCP for what sounds to be possible sleep apnea.  He does endorse photophobia and phonophobia  Review of Systems See HPI   Past Medical History:  Diagnosis Date  . Allergic rhinitis   . Anxiety   . Bartholin gland cyst 05/04/2013  . GERD (gastroesophageal reflux disease)   . Migraine without aura, without mention of intractable migraine without mention of status migrainosus 07/09/2013  . Ovarian cyst 2014  . Polyarthralgia   . Sjogren's syndrome ([place])   . Vaginal Pap smear, abnormal     Social History   Socioeconomic History  . Marital status: Single    Spouse name: Not on file  . Number of children: 3  . Years of  education: college  . Highest education level: Not on file  Occupational History  . Occupation: CMA    Employer: [employer]    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[employer]  . Smoking status: Never Smoker  . Smokeless tobacco: Never Used  Substance and Sexual Activity  . Alcohol use: Yes    Comment: 2/month  . Drug use: No  . Sexual activity: Yes    Birth control/protection: Surgical  Other Topics Concern  . Not on file  Social History Narrative   Lives with her 4 children      Lives in two story home      Right handed      Highest level of edu- [employer]      Social Determinants of Health   Financial Resource Strain:   . Difficulty of Paying Living Expenses: Not on file  Food Insecurity:   . Worried About Charity fundraiser in the Last Year: Not on file  . Ran Out of Food in the Last Year: Not on file  Transportation Needs:   . Lack of Transportation (Medical): Not on file  . Lack of Transportation (Non-Medical): Not on file  Physical Activity:   . Days of Exercise per Week: Not on file  . Minutes of Exercise per Session: Not on file  Stress:   . Feeling of Stress : Not on file  Social Connections:   . Frequency of Communication with Friends and Family: Not on file  .  Frequency of Social Gatherings with Friends and Family: Not on file  . Attends Religious Services: Not on file  . Active Member of Clubs or Organizations: Not on file  . Attends Archivist Meetings: Not on file  . Marital Status: Not on file  Intimate Partner Violence:   . Fear of Current or Ex-Partner: Not on file  . Emotionally Abused: Not on file  . Physically Abused: Not on file  . Sexually Abused: Not on file    Past Surgical History:  Procedure Laterality Date  . ADENOIDECTOMY    . ESSURE TUBAL LIGATION  07-05-2014  . THERAPEUTIC ABORTION    . TONSILLECTOMY    . TYMPANOSTOMY TUBE PLACEMENT      Family History  Problem Relation Age of Onset  . Hypertension Mother   .  Sleep apnea Mother   . Obesity Mother   . Heart disease Father   . Alcohol abuse Father        dying from this  . Diabetes Father   . High blood pressure Father   . Anxiety disorder Father   . AAA (abdominal aortic aneurysm) Father   . Mental retardation Maternal Uncle   . Diabetes Maternal Grandmother   . Hypertension Maternal Grandmother   . Kidney disease Maternal Grandmother   . Heart disease Maternal Grandmother   . Stroke Maternal Grandmother   . Prostate cancer Maternal Grandfather   . Alcohol abuse Other   . Hypertension Paternal Grandmother     Allergies  Allergen Reactions  . Decongestant [Pseudoephedrine] Other (See Comments)    SI   . Iodinated Diagnostic Agents Hives  . Percocet [Oxycodone-Acetaminophen] Itching    Current Outpatient Medications on File Prior to Visit  Medication Sig Dispense Refill  . meclizine (ANTIVERT) 25 MG tablet Take 1 tablet (25 mg total) by mouth 3 (three) times daily as needed for dizziness. 45 tablet 0  . Multiple Vitamin (MULTIVITAMIN) tablet Take 1 tablet by mouth daily.    . pantoprazole (PROTONIX) 40 MG tablet Take 1 tablet (40 mg total) by mouth daily. 30 tablet 1  . Vitamin D, Ergocalciferol, (DRISDOL) 1.25 MG (50000 UT) CAPS capsule Take 1 capsule (50,000 Units total) by mouth every 7 (seven) days. 4 capsule 0   No current facility-administered medications on file prior to visit.    There were no vitals taken for this visit.      Objective:   Physical Exam Vitals and nursing note reviewed.  Constitutional:      Appearance: Normal appearance.  HENT:     Nose: Nose normal. No congestion or rhinorrhea.     Mouth/Throat:     Mouth: Mucous membranes are moist.     Pharynx: Oropharynx is clear. No oropharyngeal exudate or posterior oropharyngeal erythema.  Eyes:     Extraocular Movements: Extraocular movements intact.     Pupils: Pupils are equal, round, and reactive to light.  Cardiovascular:     Rate and Rhythm:  Normal rate and regular rhythm.     Pulses: Normal pulses.     Heart sounds: Normal heart sounds.  Pulmonary:     Effort: Pulmonary effort is normal.     Breath sounds: Normal breath sounds.  Abdominal:     General: Abdomen is flat.     Palpations: Abdomen is soft.  Musculoskeletal:        General: Normal range of motion.  Skin:    Capillary Refill: Capillary refill takes less than 2 seconds.  Neurological:  General: No focal deficit present.     Mental Status: She is alert and oriented to person, place, and time.  Psychiatric:        Mood and Affect: Mood normal.        Behavior: Behavior normal.        Thought Content: Thought content normal.        Judgment: Judgment normal.       Assessment & Plan:  1. Other migraine without status migrainosus, not intractable -We trialed her on high flow oxygen for 20 minutes and she did have some minor improvement with this medication.  Will send in Elavil to take as a prophylactic medication and hopefully this will help with her migraine headaches.  She was advised to follow-up with her PCP  Dorothyann Peng, NP

## 2019-11-16 DIAGNOSIS — R0683 Snoring: Secondary | ICD-10-CM | POA: Diagnosis not present

## 2019-11-20 ENCOUNTER — Telehealth: Payer: Self-pay | Admitting: Pulmonary Disease

## 2019-11-20 NOTE — Telephone Encounter (Signed)
HST performed 11/08/19  Dr. Ander Slade has reviewed the home sleep test this test was negative for sleep apnea.  If there is significant concern for sleep disordered breathing, would suggest in lab polysomnogram, however, a negative sleep study will suggest absence of moderate to severe disease.  Sleep position optimization by encouraging sleep in a lateral position, elevating the head of the bed by about 30 degrees may help.     Called and spoke with pt letting her know the results of the HST and pt verbalized understanding. Nothing further needed.

## 2019-11-27 ENCOUNTER — Encounter: Payer: Self-pay | Admitting: Family Medicine

## 2019-11-27 ENCOUNTER — Telehealth (INDEPENDENT_AMBULATORY_CARE_PROVIDER_SITE_OTHER): Payer: No Typology Code available for payment source | Admitting: Family Medicine

## 2019-11-27 DIAGNOSIS — R519 Headache, unspecified: Secondary | ICD-10-CM | POA: Diagnosis not present

## 2019-11-27 DIAGNOSIS — E559 Vitamin D deficiency, unspecified: Secondary | ICD-10-CM | POA: Diagnosis not present

## 2019-11-27 DIAGNOSIS — R5383 Other fatigue: Secondary | ICD-10-CM | POA: Diagnosis not present

## 2019-11-27 DIAGNOSIS — M546 Pain in thoracic spine: Secondary | ICD-10-CM

## 2019-11-27 NOTE — Progress Notes (Signed)
Virtual Visit via Video Note   I connected with Veronica Raymond on 11/27/19 by a video enabled telemedicine application and verified that I am speaking with the correct person using two identifiers.  Location patient: home Location provider:work office Persons participating in the virtual visit: patient, provider  I discussed the limitations of evaluation and management by telemedicine and the availability of in person appointments. The patient expressed understanding and agreed to proceed.  Chief Complaint  Patient presents with  . Generalized Body Aches  . Headache    HPI: Veronica Raymond is a 34 yo female with above concerns. This morning she woke up with left frontal and maxillary sharp pain, constant. No nasal congestion or rhinorrhea. + Scratchy throat that has improved. Left swollen glands, better today. She has not noted sores in oral mucosa.  She has a month "funny" taste in her mouth that she cannot explain. Negative for dysphagia,abdominal pain,N/V,changes in bowel habits,or urinary symptoms. No sick contact or recent travel.  Also sudden onset of intense upper back pain, intermittent,exacerbated by LUE movement. Not elicited by deep breathing, cough, or palpation. She has not noted local skin rash. She had some cough yesterday, resolved.  Negative for fever, chills, oral lesions, dyspnea, wheezing, CP, or palpitations.  She also has left hemi craneal headache (frontal,parietal,and occipital). She was started on Amitriptyline about a month ago to treat chronic headache, she discontinued because next day drowsiness.  Yesterday headache was 8/10,now 5/10. Took Tylenol. Very fatigue,more than usual. She wonders if her vit D is still low. 08/16/19 25 OH vit D was 20. She took Ergocalciferol 50,000 U weekly x 4.  COVID 19 negative.   ROS: See pertinent positives and negatives per HPI.  Past Medical History:  Diagnosis Date  . Allergic rhinitis   . Anxiety   . Bartholin  gland cyst 05/04/2013  . GERD (gastroesophageal reflux disease)   . Migraine without aura, without mention of intractable migraine without mention of status migrainosus 07/09/2013  . Ovarian cyst 2014  . Polyarthralgia   . Sjogren's syndrome ([place])   . Vaginal Pap smear, abnormal     Past Surgical History:  Procedure Laterality Date  . ADENOIDECTOMY    . ESSURE TUBAL LIGATION  07-05-2014  . THERAPEUTIC ABORTION    . TONSILLECTOMY    . TYMPANOSTOMY TUBE PLACEMENT      Family History  Problem Relation Age of Onset  . Hypertension Mother   . Sleep apnea Mother   . Obesity Mother   . Heart disease Father   . Alcohol abuse Father        dying from this  . Diabetes Father   . High blood pressure Father   . Anxiety disorder Father   . AAA (abdominal aortic aneurysm) Father   . Mental retardation Maternal Uncle   . Diabetes Maternal Grandmother   . Hypertension Maternal Grandmother   . Kidney disease Maternal Grandmother   . Heart disease Maternal Grandmother   . Stroke Maternal Grandmother   . Prostate cancer Maternal Grandfather   . Alcohol abuse Other   . Hypertension Paternal Grandmother     Social History   Socioeconomic History  . Marital status: Single    Spouse name: Not on file  . Number of children: 3  . Years of education: college  . Highest education level: Not on file  Occupational History  . 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  Smoking status: Never Smoker  . Smokeless tobacco: Never Used  Substance and Sexual Activity  . Alcohol use: Yes    Comment: 2/month  . Drug use: No  . Sexual activity: Yes    Birth control/protection: Surgical  Other Topics Concern  . Not on file  Social History Narrative   Lives with her 4 children      Lives in two story home      Right handed      Highest level of edu- [employer]      Social Determinants of Health   Financial Resource Strain:   .  Difficulty of Paying Living Expenses: Not on file  Food Insecurity:   . Worried About Charity fundraiser in the Last Year: Not on file  . Ran Out of Food in the Last Year: Not on file  Transportation Needs:   . Lack of Transportation (Medical): Not on file  . Lack of Transportation (Non-Medical): Not on file  Physical Activity:   . Days of Exercise per Week: Not on file  . Minutes of Exercise per Session: Not on file  Stress:   . Feeling of Stress : Not on file  Social Connections:   . Frequency of Communication with Friends and Family: Not on file  . Frequency of Social Gatherings with Friends and Family: Not on file  . Attends Religious Services: Not on file  . Active Member of Clubs or Organizations: Not on file  . Attends Archivist Meetings: Not on file  . Marital Status: Not on file  Intimate Partner Violence:   . Fear of Current or Ex-Partner: Not on file  . Emotionally Abused: Not on file  . Physically Abused: Not on file  . Sexually Abused: Not on file      Current Outpatient Medications:  .  amitriptyline (ELAVIL) 10 MG tablet, Take 1 tablet (10 mg total) by mouth at bedtime., Disp: 30 tablet, Rfl: 0 .  Multiple Vitamin (MULTIVITAMIN) tablet, Take 1 tablet by mouth daily., Disp: , Rfl:  .  pantoprazole (PROTONIX) 40 MG tablet, Take 1 tablet (40 mg total) by mouth daily., Disp: 30 tablet, Rfl: 1  EXAM:  VITALS per patient if applicable:N/A  GENERAL: alert, oriented, appears well and in no acute distress  HEENT: atraumatic, conjunctiva clear, no obvious abnormalities on inspection.  NECK: normal movements of the head and neck. I do not appreciate neck masses or asymmetries.   LUNGS: on inspection no signs of respiratory distress, breathing rate appears normal, no obvious gross SOB, gasping or wheezing  CV: no obvious cyanosis  Veronica: moves all visible extremities without noticeable abnormality  PSYCH/NEURO: pleasant and cooperative, no obvious  depression or anxiety, speech and thought processing grossly intact  ASSESSMENT AND PLAN:  Discussed the following assessment and plan:  Fatigue, unspecified type - Plan: CBC with Differential/Platelet This seems to be a chronic problem. We discussed possible etiologies: Systemic illness, immunologic,endocrinology,sleep disorder, psychiatric/psychologic, infectious,medications side effects, and idiopathic.  I think anxiety could be a precipitating factor.       She has had myalgias and polyarthralgia, fibromyalgia is one to consider.She is not interested in trying Cymbalta for now.  Further recommendations will be given according to lab results.  Acute left-sided thoracic back pain - Plan: DG Chest 2 View Hx suggest musculoskeletal etiology. Other possible etiologies discussed. CXR order placed.  Vitamin D deficiency - Plan: VITAMIN D 25 Hydroxy (Vit-D Deficiency, Fractures) Further recommendations will be given according to 25  OH vit D result.  Headache, unspecified headache type ? Tension like headache. She did not tolerate Amitriptyline. Explained that drowsiness usually improves after a few weeks. Other treatment option,Topamax.    I discussed the assessment and treatment plan with the patient. Veronica Raymond was provided an opportunity to ask questions and all were answered. She agreed with the plan and demonstrated an understanding of the instructions.    Return in about 2 weeks (around 12/11/2019), or if symptoms worsen or fail to improve.    Betty Martinique, MD

## 2019-12-05 ENCOUNTER — Other Ambulatory Visit: Payer: Self-pay

## 2019-12-05 ENCOUNTER — Ambulatory Visit (INDEPENDENT_AMBULATORY_CARE_PROVIDER_SITE_OTHER): Payer: No Typology Code available for payment source

## 2019-12-05 ENCOUNTER — Other Ambulatory Visit (INDEPENDENT_AMBULATORY_CARE_PROVIDER_SITE_OTHER): Payer: No Typology Code available for payment source

## 2019-12-05 DIAGNOSIS — M546 Pain in thoracic spine: Secondary | ICD-10-CM

## 2019-12-05 DIAGNOSIS — E559 Vitamin D deficiency, unspecified: Secondary | ICD-10-CM | POA: Diagnosis not present

## 2019-12-05 DIAGNOSIS — R5383 Other fatigue: Secondary | ICD-10-CM

## 2019-12-05 LAB — CBC WITH DIFFERENTIAL/PLATELET
Basophils Absolute: 0 10*3/uL (ref 0.0–0.1)
Basophils Relative: 0.4 % (ref 0.0–3.0)
Eosinophils Absolute: 0 10*3/uL (ref 0.0–0.7)
Eosinophils Relative: 0.7 % (ref 0.0–5.0)
HCT: 37.6 % (ref 36.0–46.0)
Hemoglobin: 12.6 g/dL (ref 12.0–15.0)
Lymphocytes Relative: 49.3 % — ABNORMAL HIGH (ref 12.0–46.0)
Lymphs Abs: 3.1 10*3/uL (ref 0.7–4.0)
MCHC: 33.6 g/dL (ref 30.0–36.0)
MCV: 92.7 fl (ref 78.0–100.0)
Monocytes Absolute: 0.5 10*3/uL (ref 0.1–1.0)
Monocytes Relative: 7.9 % (ref 3.0–12.0)
Neutro Abs: 2.6 10*3/uL (ref 1.4–7.7)
Neutrophils Relative %: 41.7 % — ABNORMAL LOW (ref 43.0–77.0)
Platelets: 347 10*3/uL (ref 150.0–400.0)
RBC: 4.05 Mil/uL (ref 3.87–5.11)
RDW: 13 % (ref 11.5–15.5)
WBC: 6.4 10*3/uL (ref 4.0–10.5)

## 2019-12-05 LAB — VITAMIN D 25 HYDROXY (VIT D DEFICIENCY, FRACTURES): VITD: 30.56 ng/mL (ref 30.00–100.00)

## 2019-12-07 ENCOUNTER — Encounter: Payer: Self-pay | Admitting: Family Medicine

## 2019-12-11 ENCOUNTER — Encounter: Payer: Self-pay | Admitting: Family Medicine

## 2020-01-11 ENCOUNTER — Encounter: Payer: Self-pay | Admitting: Family Medicine

## 2020-01-11 ENCOUNTER — Telehealth (INDEPENDENT_AMBULATORY_CARE_PROVIDER_SITE_OTHER): Payer: No Typology Code available for payment source | Admitting: Family Medicine

## 2020-01-11 VITALS — Ht 64.0 in

## 2020-01-11 DIAGNOSIS — R430 Anosmia: Secondary | ICD-10-CM

## 2020-01-11 DIAGNOSIS — R5383 Other fatigue: Secondary | ICD-10-CM | POA: Diagnosis not present

## 2020-01-11 DIAGNOSIS — U071 COVID-19: Secondary | ICD-10-CM

## 2020-01-11 NOTE — Progress Notes (Signed)
Virtual Visit via Video Note   I connected with Veronica Raymond on 01/11/20 by a video enabled telemedicine application and verified that I am speaking with the correct person using two identifiers.  Location patient: home Location provider:work office Persons participating in the virtual visit: patient, provider  I discussed the limitations of evaluation and management by telemedicine and the availability of in person appointments. The patient expressed understanding and agreed to proceed.   HPI: Veronica Raymond is a 34 yo female with Hx of fatigue,anxiety,polyartrhalgias,and headache c/o fatigue. She was recently Dx'ed with COVID 19 infection. Started with productive cough,fatigue,and anosmia 12/27/19. Positive test on 12/28/19.  Fatigue improved and started back yesterday. She is taking care of her 2 children with covid infection and she is afraid of getting infected/symptomatic again.  Negative for sore throat,[place], CP,SOB,wheezing. Cough has improved. Anosmia has not resolved, some improvement.  Negative for abdominal pain,N/V,or changes in bowel habits.   ROS: See pertinent positives and negatives per HPI.  Past Medical History:  Diagnosis Date  . Allergic rhinitis   . Anxiety   . Bartholin gland cyst 05/04/2013  . GERD (gastroesophageal reflux disease)   . Migraine without aura, without mention of intractable migraine without mention of status migrainosus 07/09/2013  . Ovarian cyst 2014  . Polyarthralgia   . Sjogren's syndrome ([place])   . Vaginal Pap smear, abnormal     Past Surgical History:  Procedure Laterality Date  . ADENOIDECTOMY    . ESSURE TUBAL LIGATION  07-05-2014  . THERAPEUTIC ABORTION    . TONSILLECTOMY    . TYMPANOSTOMY TUBE PLACEMENT      Family History  Problem Relation Age of Onset  . Hypertension Mother   . Sleep apnea Mother   . Obesity Mother   . Heart disease Father   . Alcohol abuse Father        dying from this  . Diabetes Father   . High  blood pressure Father   . Anxiety disorder Father   . AAA (abdominal aortic aneurysm) Father   . Mental retardation Maternal Uncle   . Diabetes Maternal Grandmother   . Hypertension Maternal Grandmother   . Kidney disease Maternal Grandmother   . Heart disease Maternal Grandmother   . Stroke Maternal Grandmother   . Prostate cancer Maternal Grandfather   . Alcohol abuse Other   . Hypertension Paternal Grandmother     Social History   Socioeconomic History  . Marital status: Single    Spouse name: Not on file  . Number of children: 3  . Years of education: college  . Highest education level: Not on file  Occupational History  . 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 Smoking status: Never Smoker  . Smokeless tobacco: Never Used  Substance and Sexual Activity  . Alcohol use: Yes    Comment: 2/month  . Drug use: No  . Sexual activity: Yes    Birth control/protection: Surgical  Other Topics Concern  . Not on file  Social History Narrative   Lives with her 4 children      Lives in two story home      Right handed      Highest level of edu- [employer]      Social Determinants of Health   Financial Resource Strain:   . Difficulty of Paying Living Expenses:   Food Insecurity:   . Worried About Charity fundraiser in  the Last Year:   . [place] in the Last Year:   Transportation Needs:   . Film/video editor (Medical):   Marland Kitchen Lack of Transportation (Non-Medical):   Physical Activity:   . Days of Exercise per Week:   . Minutes of Exercise per Session:   Stress:   . Feeling of Stress :   Social Connections:   . Frequency of Communication with Friends and Family:   . Frequency of Social Gatherings with Friends and Family:   . Attends Religious Services:   . Active Member of Clubs or Organizations:   . Attends Archivist Meetings:   Marland Kitchen Marital Status:   Intimate Partner Violence:   . Fear of  Current or Ex-Partner:   . Emotionally Abused:   Marland Kitchen Physically Abused:   . Sexually Abused:      Current Outpatient Medications:  .  amitriptyline (ELAVIL) 10 MG tablet, Take 1 tablet (10 mg total) by mouth at bedtime., Disp: 30 tablet, Rfl: 0 .  Multiple Vitamin (MULTIVITAMIN) tablet, Take 1 tablet by mouth daily., Disp: , Rfl:  .  pantoprazole (PROTONIX) 40 MG tablet, Take 1 tablet (40 mg total) by mouth daily., Disp: 30 tablet, Rfl: 1  EXAM:  VITALS per patient if applicable:Ht 5\' 4"  (1.626 m)   BMI 32.44 kg/m   GENERAL: alert, oriented, appears well and in no acute distress  HEENT: atraumatic, conjunctiva clear, no obvious abnormalities on inspection of external nose and ears  LUNGS: on inspection no signs of respiratory distress, breathing rate appears normal, no obvious gross SOB, gasping or wheezing  CV: no obvious cyanosis  PSYCH/NEURO: pleasant and cooperative, no obvious depression or anxiety, speech and thought processing grossly intact  ASSESSMENT AND PLAN:  Discussed the following assessment and plan:  Fatigue, unspecified type She has hx of fatigue. Explained that it is not uncommon to have residual fatigue after COVID 19 infection.  COVID-19 virus infection Recovered. FMLA has been completed.  Anosmia Explained that it may take a few more weeks for problem to resolve, a few patients have reported having problems with his female after a few months of recovery. Continue monitoring.  I discussed the assessment and treatment plan with the patient. Veronica Raymond was provided an opportunity to ask questions and all were answered. She agreed with the plan and demonstrated an understanding of the instructions.    No follow-ups on file.    Kamren Heintzelman Martinique, MD

## 2020-02-07 ENCOUNTER — Encounter: Payer: Self-pay | Admitting: Family Medicine

## 2020-02-07 ENCOUNTER — Ambulatory Visit (INDEPENDENT_AMBULATORY_CARE_PROVIDER_SITE_OTHER): Payer: No Typology Code available for payment source | Admitting: Family Medicine

## 2020-02-07 ENCOUNTER — Other Ambulatory Visit: Payer: Self-pay

## 2020-02-07 VITALS — BP 102/74 | HR 90 | Temp 97.9°F | Wt 189.0 lb

## 2020-02-07 DIAGNOSIS — L509 Urticaria, unspecified: Secondary | ICD-10-CM

## 2020-02-07 DIAGNOSIS — R202 Paresthesia of skin: Secondary | ICD-10-CM | POA: Diagnosis not present

## 2020-02-07 DIAGNOSIS — R200 Anesthesia of skin: Secondary | ICD-10-CM

## 2020-02-07 DIAGNOSIS — J302 Other seasonal allergic rhinitis: Secondary | ICD-10-CM | POA: Diagnosis not present

## 2020-02-07 MED ORDER — LORATADINE 10 MG PO TABS: 10.0000 mg | ORAL_TABLET | Freq: Every day | ORAL | 1 refills | Status: DC

## 2020-02-07 NOTE — Progress Notes (Signed)
Subjective:    Patient ID: Veronica Raymond, female    DOB: [birth year], 34 y.o.   MRN: [id number]  No chief complaint on file.   HPI Patient was seen today for acute concern.  Pt notes intermittent rash and pruritis all over body.  Started 4 days ago.  Noticed at night.  Pt states the rash looked like small mosquito bites but then disappeared.  Patient initially concerned for bedbugs, but states her boyfriend denies similar symptoms.  Rash can randomly appear.  Pt stopped using a new lotion 2 wks ago but denies any other new soaps, lotions, detergents, or foods.  Pt has a h/o seasonal allergies.  Endorses sleeping with the windows open at night and a fan on.  Not currently taking Claritin.  Pt also notes having the sensation of water running down her thighs while walking.  Pt also notes numbness and tingling sensation in upper and lower extremities at times.  Pt denies lower extremity weakness.  Past Medical History:  Diagnosis Date  . Allergic rhinitis   . Anxiety   . Bartholin gland cyst 05/04/2013  . GERD (gastroesophageal reflux disease)   . Migraine without aura, without mention of intractable migraine without mention of status migrainosus 07/09/2013  . Ovarian cyst 2014  . Polyarthralgia   . Sjogren's syndrome ([place])   . Vaginal Pap smear, abnormal     Allergies  Allergen Reactions  . Decongestant [Pseudoephedrine] Other (See Comments)    SI   . Iodinated Diagnostic Agents Hives  . Percocet [Oxycodone-Acetaminophen] Itching    ROS General: Denies fever, chills, night sweats, changes in weight, changes in appetite  +heat/cold intolerance.  Changes in texture of hair-though recently dyed. HEENT: Denies headaches, ear pain, changes in vision, rhinorrhea, sore throat CV: Denies CP, SOB, orthopnea  +palpiations Pulm: Denies SOB, cough, wheezing GI: Denies abdominal pain, nausea, vomiting, diarrhea  +constipation GU: Denies dysuria, hematuria, frequency, vaginal discharge Msk:  Denies muscle cramps, joint pains Neuro: Denies weakness  + numbness, tingling Skin: Denies bruising  +intermittent rash and pruritis Psych: Denies depression, anxiety, hallucinations     Objective:    Blood pressure 102/74, pulse 90, temperature 97.9 F (36.6 C), temperature source Temporal, weight 189 lb (85.7 kg), SpO2 98 %.  Gen. Pleasant, well-nourished, in no distress, normal affect   HEENT: [place]/AT, face symmetric, no scleral icterus, PERRLA, EOMI, nares patent without drainage Lungs: no accessory muscle use Cardiovascular: RRR, no peripheral edema Musculoskeletal: No deformities, no cyanosis or clubbing, normal tone Neuro:  A&Ox3, CN II-XII intact, normal gait Skin:  Warm, no lesions/ rash   Wt Readings from Last 3 Encounters:  02/07/20 189 lb (85.7 kg)  09/14/19 189 lb (85.7 kg)  09/13/19 185 lb (83.9 kg)    Lab Results  Component Value Date   WBC 6.4 12/05/2019   HGB 12.6 12/05/2019   HCT 37.6 12/05/2019   PLT 347.0 12/05/2019   GLUCOSE 82 08/16/2019   CHOL 183 08/16/2019   TRIG 89 08/16/2019   HDL 55 08/16/2019   LDLCALC 112 (H) 08/16/2019   ALT 8 08/16/2019   AST 13 08/16/2019   NA 140 08/16/2019   K 4.5 08/16/2019   CL 102 08/16/2019   CREATININE 0.83 08/16/2019   BUN 13 08/16/2019   CO2 25 08/16/2019   TSH 1.050 08/16/2019   HGBA1C 5.5 08/16/2019    Assessment/Plan:  Urticaria -Discussed various causes including seasonal allergies, lotions and other products, medications, foods, idiopathic -Discussed using antihistamine to see if  symptoms improve -Given handout -We will continue to monitor  - Plan: loratadine (CLARITIN) 10 MG tablet, CBC (no diff)  Numbness and tingling  -Discussed possible causes including vitamin deficiencies, thyroid dysfunction -We will obtain labs -Given handout - Plan: Vitamin B12, TSH, T4, Free, Folate  Seasonal allergies -Discussed using Claritin regularly -Encouraged to close window at night 2/2 increased  pollen -We will continue to monitor -Consider nasal saline spray or Flonase for continued symptoms  F/u prn  Grier Mitts, MD

## 2020-02-07 NOTE — Patient Instructions (Signed)
Hives Hives (urticaria) are itchy, red, swollen areas on the skin. Hives can appear on any part of the body. Hives often fade within 24 hours (acute hives). Sometimes, new hives appear after old ones fade and the cycle can continue for several days or weeks (chronic hives). Hives do not spread from person to person (are not contagious). Hives come from the body's reaction to something a person is allergic to (allergen), something that causes irritation, or various other triggers. When a person is exposed to a trigger, his or her body releases a chemical (histamine) that causes redness, itching, and swelling. Hives can appear right after exposure to a trigger or hours later. What are the causes? This condition may be caused by:  Allergies to foods or ingredients.  Insect bites or stings.  Exposure to pollen or pets.  Contact with latex or chemicals.  Spending time in sunlight, heat, or cold (exposure).  Exercise.  Stress.  Certain medicines. You can also get hives from other medical conditions and treatments, such as:  Viruses, including the common cold.  Bacterial infections, such as urinary tract infections and strep throat.  Certain medicines.  Allergy shots.  Blood transfusions. Sometimes, the cause of this condition is not known (idiopathic hives). What increases the risk? You are more likely to develop this condition if you:  Are a woman.  Have food allergies, especially to citrus fruits, milk, eggs, peanuts, tree nuts, or shellfish.  Are allergic to: ? Medicines. ? Latex. ? Insects. ? Animals. ? Pollen. What are the signs or symptoms? Common symptoms of this condition include raised, itchy, red or white bumps or patches on your skin. These areas may:  Become large and swollen (welts).  Change in shape and location, quickly and repeatedly.  Be separate hives or connect over a large area of skin.  Sting or become painful.  Turn white when pressed in the  center (blanch). In severe cases, yourhands, feet, and face may also become swollen. This may occur if hives develop deeper in your skin. How is this diagnosed? This condition may be diagnosed by your symptoms, medical history, and physical exam.  Your skin, urine, or blood may be tested to find out what is causing your hives and to rule out other health issues.  Your health care provider may also remove a small sample of skin from the affected area and examine it under a microscope (biopsy). How is this treated? Treatment for this condition depends on the cause and severity of your symptoms. Your health care provider may recommend using cool, wet cloths (cool compresses) or taking cool showers to relieve itching. Treatment may include:  Medicines that help: ? Relieve itching (antihistamines). ? Reduce swelling (corticosteroids). ? Treat infection (antibiotics).  An injectable medicine (omalizumab). Your health care provider may prescribe this if you have chronic idiopathic hives and you continue to have symptoms even after treatment with antihistamines. Severe cases may require an emergency injection of adrenaline (epinephrine) to prevent a life-threatening allergic reaction (anaphylaxis). Follow these instructions at home: Medicines  Take and apply over-the-counter and prescription medicines only as told by your health care provider.  If you were prescribed an antibiotic medicine, take it as told by your health care provider. Do not stop using the antibiotic even if you start to feel better. Skin care  Apply cool compresses to the affected areas.  Do not scratch or rub your skin. General instructions  Do not take hot showers or baths. This can make itching   worse.  Do not wear tight-fitting clothing.  Use sunscreen and wear protective clothing when you are outside.  Avoid any substances that cause your hives. Keep a journal to help track what causes your hives. Write  down: ? What medicines you take. ? What you eat and drink. ? What products you use on your skin.  Keep all follow-up visits as told by your health care provider. This is important. Contact a health care provider if:  Your symptoms are not controlled with medicine.  Your joints are painful or swollen. Get help right away if:  You have a fever.  You have pain in your abdomen.  Your tongue or lips are swollen.  Your eyelids are swollen.  Your chest or throat feels tight.  You have trouble breathing or swallowing. These symptoms may represent a serious problem that is an emergency. Do not wait to see if the symptoms will go away. Get medical help right away. Call your local emergency services (911 in the [place] Do not drive yourself to the hospital. Summary  Hives (urticaria) are itchy, red, swollen areas on your skin. Hives come from the body's reaction to something a person is allergic to (allergen), something that causes irritation, or various other triggers.  Treatment for this condition depends on the cause and severity of your symptoms.  Avoid any substances that cause your hives. Keep a journal to help track what causes your hives.  Take and apply over-the-counter and prescription medicines only as told by your health care provider.  Keep all follow-up visits as told by your health care provider. This is important. This information is not intended to replace advice given to you by your health care provider. Make sure you discuss any questions you have with your health care provider. Document Revised: 03/29/2018 Document Reviewed: 03/29/2018 Elsevier Patient Education  [place].  Paresthesia Paresthesia is an abnormal burning or prickling sensation. It is usually felt in the hands, arms, legs, or feet. However, it may occur in any part of the body. Usually, paresthesia is not painful. It may feel like:  Tingling or numbness.  Buzzing.  Itching. Paresthesia  may occur without any clear cause, or it may be caused by:  Breathing too quickly (hyperventilation).  Pressure on a nerve.  An underlying medical condition.  Side effects of a medication.  Nutritional deficiencies.  Exposure to toxic chemicals. Most people experience temporary (transient) paresthesia at some time in their lives. For some people, it may be long-lasting (chronic) because of an underlying medical condition. If you have paresthesia that lasts a long time, you may need to be evaluated by your health care provider. Follow these instructions at home: Alcohol use   Do not drink alcohol if: ? Your health care provider tells you not to drink. ? You are pregnant, may be pregnant, or are planning to become pregnant.  If you drink alcohol: ? Limit how much you use to:  0-1 drink a day for women.  0-2 drinks a day for men. ? Be aware of how much alcohol is in your drink. In the [place] one drink equals one 12 oz bottle of beer (355 mL), one 5 oz glass of wine (148 mL), or one 1 oz glass of hard liquor (44 mL). Nutrition   Eat a healthy diet. This includes: ? Eating foods that are high in fiber, such as fresh fruits and vegetables, whole grains, and beans. ? Limiting foods that are high in fat and processed sugars,  such as fried or sweet foods. General instructions  Take over-the-counter and prescription medicines only as told by your health care provider.  Do not use any products that contain nicotine or tobacco, such as cigarettes and e-cigarettes. These can keep blood from reaching damaged nerves. If you need help quitting, ask your health care provider.  If you have diabetes, work closely with your health care provider to keep your blood sugar under control.  If you have numbness in your feet: ? Check every day for signs of injury or infection. Watch for redness, warmth, and swelling. ? Wear padded socks and comfortable shoes. These help protect your feet.  Keep  all follow-up visits as told by your health care provider. This is important. Contact a health care provider if you:  Have paresthesia that gets worse or does not go away.  Have a burning or prickling feeling that gets worse when you walk.  Have pain, cramps, or dizziness.  Develop a rash. Get help right away if you:  Feel weak.  Have trouble walking or moving.  Have problems with speech, understanding, or vision.  Feel confused.  Cannot control your bladder or bowel movements.  Have numbness after an injury.  Develop new weakness in an arm or leg.  Faint. Summary  Paresthesia is an abnormal burning or prickling sensation that is usually felt in the hands, arms, legs, or feet. It may also occur in other parts of the body.  Paresthesia may occur without any clear cause, or it may be caused by breathing too quickly (hyperventilation), pressure on a nerve, an underlying medical condition, side effects of a medication, nutritional deficiencies, or exposure to toxic chemicals.  If you have paresthesia that lasts a long time, you may need to be evaluated by your health care provider. This information is not intended to replace advice given to you by your health care provider. Make sure you discuss any questions you have with your health care provider. Document Revised: 10/09/2018 Document Reviewed: 09/22/2017 Elsevier Patient Education  2020 [employer].

## 2020-02-08 LAB — TSH: TSH: 1.09 u[IU]/mL (ref 0.35–4.50)

## 2020-02-08 LAB — CBC
HCT: 37.5 % (ref 36.0–46.0)
Hemoglobin: 12.3 g/dL (ref 12.0–15.0)
MCHC: 32.9 g/dL (ref 30.0–36.0)
MCV: 94.9 fl (ref 78.0–100.0)
Platelets: 406 10*3/uL — ABNORMAL HIGH (ref 150.0–400.0)
RBC: 3.95 Mil/uL (ref 3.87–5.11)
RDW: 12.8 % (ref 11.5–15.5)
WBC: 6.2 10*3/uL (ref 4.0–10.5)

## 2020-02-08 LAB — T4, FREE: Free T4: 0.68 ng/dL (ref 0.60–1.60)

## 2020-02-08 LAB — FOLATE: Folate: 22.2 ng/mL (ref >5.9)

## 2020-02-08 LAB — VITAMIN B12: Vitamin B-12: 343 pg/mL (ref 211–911)

## 2020-03-07 ENCOUNTER — Ambulatory Visit (INDEPENDENT_AMBULATORY_CARE_PROVIDER_SITE_OTHER): Payer: No Typology Code available for payment source | Admitting: *Deleted

## 2020-03-07 ENCOUNTER — Other Ambulatory Visit: Payer: Self-pay

## 2020-03-07 DIAGNOSIS — R200 Anesthesia of skin: Secondary | ICD-10-CM | POA: Diagnosis not present

## 2020-03-07 DIAGNOSIS — R202 Paresthesia of skin: Secondary | ICD-10-CM | POA: Diagnosis not present

## 2020-03-07 MED ORDER — CYANOCOBALAMIN 1000 MCG/ML IJ SOLN
1000.0000 ug | Freq: Once | INTRAMUSCULAR | Status: AC
  Administered 2020-03-07: 1000 ug via INTRAMUSCULAR

## 2020-03-07 NOTE — Progress Notes (Signed)
Veronica Raymond is a 34 y.o. female presents to the office today for B-12  injections, per physician's orders. Patient tolerated injection.  [name]

## 2020-03-21 ENCOUNTER — Other Ambulatory Visit: Payer: Self-pay

## 2020-03-21 ENCOUNTER — Ambulatory Visit (INDEPENDENT_AMBULATORY_CARE_PROVIDER_SITE_OTHER): Payer: No Typology Code available for payment source

## 2020-03-21 DIAGNOSIS — E538 Deficiency of other specified B group vitamins: Secondary | ICD-10-CM | POA: Diagnosis not present

## 2020-03-21 MED ORDER — CYANOCOBALAMIN 1000 MCG/ML IJ SOLN
1000.0000 ug | Freq: Once | INTRAMUSCULAR | Status: AC
  Administered 2020-03-21: 1000 ug via INTRAMUSCULAR

## 2020-03-21 NOTE — Progress Notes (Signed)
Per orders of Dr. Jordan, injection of B12 given by Miachel Nardelli. Patient tolerated injection well.  

## 2020-04-10 ENCOUNTER — Other Ambulatory Visit: Payer: Self-pay

## 2020-04-10 ENCOUNTER — Encounter: Payer: Self-pay | Admitting: Adult Health

## 2020-04-10 ENCOUNTER — Ambulatory Visit (INDEPENDENT_AMBULATORY_CARE_PROVIDER_SITE_OTHER): Payer: No Typology Code available for payment source | Admitting: Adult Health

## 2020-04-10 VITALS — BP 110/66 | HR 59 | Temp 98.6°F | Wt 190.0 lb

## 2020-04-10 DIAGNOSIS — M79675 Pain in left toe(s): Secondary | ICD-10-CM

## 2020-04-10 MED ORDER — DOXYCYCLINE HYCLATE 100 MG PO CAPS: 100.0000 mg | ORAL_CAPSULE | Freq: Two times a day (BID) | ORAL | 0 refills | Status: DC

## 2020-04-10 MED ORDER — PREDNISONE 20 MG PO TABS: 20.0000 mg | ORAL_TABLET | Freq: Every day | ORAL | 0 refills | Status: DC

## 2020-04-10 NOTE — Progress Notes (Addendum)
Subjective:    Patient ID: Veronica Raymond, female    DOB: 1985/11/28, 35 y.o.   MRN: [id number]  HPI 34 year old female who  has a past medical history of Allergic rhinitis, Anxiety, Bartholin gland cyst (05/04/2013), GERD (gastroesophageal reflux disease), Migraine without aura, without mention of intractable migraine without mention of status migrainosus (07/09/2013), Ovarian cyst (2014), Polyarthralgia, Sjogren's syndrome ([place]), and Vaginal Pap smear, abnormal.  She presents to the office today for an acute issue of left great toe pain.  She reports 2 days ago she believes she was bit by a mosquito along the medial aspect of her left foot above her left pinky toe.  Yesterday she developed pain on the plantar surface of her left great toe.  Pain is felt as a burning pain and is present with weightbearing and palpation.  He has found a small palpable mass on the plantar surface of the left great toe.  She denies history of gout or penetrating injury to the left great foot.   Review of Systems See HPI   Past Medical History:  Diagnosis Date  . Allergic rhinitis   . Anxiety   . Bartholin gland cyst 05/04/2013  . GERD (gastroesophageal reflux disease)   . Migraine without aura, without mention of intractable migraine without mention of status migrainosus 07/09/2013  . Ovarian cyst 2014  . Polyarthralgia   . Sjogren's syndrome ([place])   . Vaginal Pap smear, abnormal     Social History   Socioeconomic History  . Marital status: Single    Spouse name: Not on file  . Number of children: 3  . Years of education: college  . Highest education level: Not on file  Occupational History  . Occupation: CMA    Employer: [employer]    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[employer]  . Smoking status: Never Smoker  . Smokeless tobacco: Never Used  Vaping Use  . Vaping Use: Never used  Substance and Sexual Activity  . Alcohol use: Yes    Comment: 2/month  . Drug use: No  . Sexual  activity: Yes    Birth control/protection: Surgical  Other Topics Concern  . Not on file  Social History Narrative   Lives with her 4 children      Lives in two story home      Right handed      Highest level of edu- [employer]      Social Determinants of Health   Financial Resource Strain:   . Difficulty of Paying Living Expenses:   Food Insecurity:   . Worried About Charity fundraiser in the Last Year:   . Arboriculturist in the Last Year:   Transportation Needs:   . Film/video editor (Medical):   Marland Kitchen Lack of Transportation (Non-Medical):   Physical Activity:   . Days of Exercise per Week:   . Minutes of Exercise per Session:   Stress:   . Feeling of Stress :   Social Connections:   . Frequency of Communication with Friends and Family:   . Frequency of Social Gatherings with Friends and Family:   . Attends Religious Services:   . Active Member of Clubs or Organizations:   . Attends Archivist Meetings:   Marland Kitchen Marital Status:   Intimate Partner Violence:   . Fear of Current or Ex-Partner:   . Emotionally Abused:   Marland Kitchen Physically Abused:   . Sexually Abused:     Past  Surgical History:  Procedure Laterality Date  . ADENOIDECTOMY    . ESSURE TUBAL LIGATION  07-05-2014  . THERAPEUTIC ABORTION    . TONSILLECTOMY    . TYMPANOSTOMY TUBE PLACEMENT      Family History  Problem Relation Age of Onset  . Hypertension Mother   . Sleep apnea Mother   . Obesity Mother   . Heart disease Father   . Alcohol abuse Father        dying from this  . Diabetes Father   . High blood pressure Father   . Anxiety disorder Father   . AAA (abdominal aortic aneurysm) Father   . Mental retardation Maternal Uncle   . Diabetes Maternal Grandmother   . Hypertension Maternal Grandmother   . Kidney disease Maternal Grandmother   . Heart disease Maternal Grandmother   . Stroke Maternal Grandmother   . Prostate cancer Maternal Grandfather   . Alcohol abuse Other   .  Hypertension Paternal Grandmother     Allergies  Allergen Reactions  . Decongestant [Pseudoephedrine] Other (See Comments)    SI   . Iodinated Diagnostic Agents Hives  . Percocet [Oxycodone-Acetaminophen] Itching    Current Outpatient Medications on File Prior to Visit  Medication Sig Dispense Refill  . Multiple Vitamin (MULTIVITAMIN) tablet Take 1 tablet by mouth daily.     No current facility-administered medications on file prior to visit.    BP 110/66   Pulse (!) 59   Temp 98.6 F (37 C)   Wt 190 lb (86.2 kg)   SpO2 97%   BMI 32.61 kg/m       Objective:   Physical Exam Vitals and nursing note reviewed.  Constitutional:      Appearance: Normal appearance.  Musculoskeletal:        General: Tenderness present. Normal range of motion.  Skin:    General: Skin is warm and dry.     Findings: Erythema present.     Comments: Redness, warmth, and swelling noted to the plantar surface of the left great toe.  She does have a palpable mass about the size of a pencil eraser also noted on plantar surface below the left great toe.  She has full range of motion.  No sign of penetrating injury  Neurological:     General: No focal deficit present.     Mental Status: She is alert and oriented to person, place, and time.  Psychiatric:        Mood and Affect: Mood normal.        Behavior: Behavior normal.        Thought Content: Thought content normal.        Judgment: Judgment normal.        Assessment & Plan:  1. Great toe pain, right -Possible gout versus cellulitis versus abscess?  Will cover for both infection and gout with prednisone and doxycycline.  May need to consider x-ray in the future.  Follow-up if no improvement over the next 2 to 3 days - predniSONE (DELTASONE) 20 MG tablet; Take 1 tablet (20 mg total) by mouth daily with breakfast.  Dispense: 7 tablet; Refill: 0 - doxycycline (VIBRAMYCIN) 100 MG capsule; Take 1 capsule (100 mg total) by mouth 2 (two) times  daily.  Dispense: 14 capsule; Refill: 0 - Uric Acid  Dorothyann Peng, NP

## 2020-04-10 NOTE — Addendum Note (Signed)
Addended by: Apolinar Junes on: 04/10/2020 11:24 AM   Modules accepted: Orders

## 2020-04-11 ENCOUNTER — Ambulatory Visit (INDEPENDENT_AMBULATORY_CARE_PROVIDER_SITE_OTHER): Payer: No Typology Code available for payment source | Admitting: *Deleted

## 2020-04-11 DIAGNOSIS — E538 Deficiency of other specified B group vitamins: Secondary | ICD-10-CM

## 2020-04-11 MED ORDER — CYANOCOBALAMIN 1000 MCG/ML IJ SOLN
1000.0000 ug | Freq: Once | INTRAMUSCULAR | Status: AC
  Administered 2020-04-11: 1000 ug via INTRAMUSCULAR

## 2020-04-11 NOTE — Addendum Note (Signed)
Addended by: Marrion Coy on: 04/11/2020 04:21 PM   Modules accepted: Orders

## 2020-04-11 NOTE — Progress Notes (Signed)
Per orders of Dr. [place]ique, injection of B 12 given by Zacarias Pontes. Patient tolerated injection well.

## 2020-04-11 NOTE — Addendum Note (Signed)
Addended by: Marrion Coy on: 04/11/2020 10:44 AM   Modules accepted: Orders

## 2020-04-11 NOTE — Addendum Note (Signed)
Addended by: Marrion Coy on: 04/11/2020 10:41 AM   Modules accepted: Orders

## 2020-05-04 ENCOUNTER — Ambulatory Visit (HOSPITAL_COMMUNITY)
Admission: EM | Admit: 2020-05-04 | Discharge: 2020-05-04 | Disposition: A | Discharge status: Home / Self Care | Payer: No Typology Code available for payment source | Attending: Emergency Medicine | Admitting: Emergency Medicine

## 2020-05-04 ENCOUNTER — Other Ambulatory Visit: Payer: Self-pay

## 2020-05-04 ENCOUNTER — Encounter (HOSPITAL_COMMUNITY): Payer: Self-pay | Admitting: *Deleted

## 2020-05-04 DIAGNOSIS — Z885 Allergy status to narcotic agent status: Secondary | ICD-10-CM | POA: Insufficient documentation

## 2020-05-04 DIAGNOSIS — Z20822 Contact with and (suspected) exposure to covid-19: Secondary | ICD-10-CM | POA: Diagnosis not present

## 2020-05-04 DIAGNOSIS — J029 Acute pharyngitis, unspecified: Secondary | ICD-10-CM | POA: Diagnosis present

## 2020-05-04 DIAGNOSIS — Z792 Long term (current) use of antibiotics: Secondary | ICD-10-CM | POA: Insufficient documentation

## 2020-05-04 DIAGNOSIS — Z7952 Long term (current) use of systemic steroids: Secondary | ICD-10-CM | POA: Diagnosis not present

## 2020-05-04 NOTE — Discharge Instructions (Signed)
Monitor MyChart for COVID results

## 2020-05-04 NOTE — ED Triage Notes (Signed)
C/O scratchy throat onset 3 days ago; had negative Covid test per pt.  Sxs have resolved over past couple days.

## 2020-05-05 LAB — NOVEL CORONAVIRUS, NAA (HOSP ORDER, SEND-OUT TO REF LAB; TAT 18-24 HRS): SARS-CoV-2, NAA: NOT DETECTED

## 2020-05-05 NOTE — ED Provider Notes (Signed)
[place]    CSN: [id number] Arrival date & time: 05/04/20  1121      History   Chief Complaint Chief Complaint  Patient presents with  . Scratchy Throat    HPI Veronica Raymond is a 34 y.o. female no significant past medical history presenting today for evaluation of sore throat.  Patient had strep throat 3 days ago which has resolved.  She denies other URI symptoms.  Here with children who have also had similar symptoms times.  Reports daughter who had possible exposure to Covid at camp.  Has had recent negative Covid test, but concerned about accuracy.  HPI  Past Medical History:  Diagnosis Date  . Allergic rhinitis   . Anxiety   . Bartholin gland cyst 05/04/2013  . GERD (gastroesophageal reflux disease)   . Migraine without aura, without mention of intractable migraine without mention of status migrainosus 07/09/2013  . Ovarian cyst 2014  . Polyarthralgia   . Sjogren's syndrome ([place])   . Vaginal Pap smear, abnormal     Patient Active Problem List   Diagnosis Date Noted  . Vitamin D deficiency 08/20/2019  . Class 1 obesity due to excess calories without serious comorbidity with body mass index (BMI) of 31.0 to 31.9 in adult 08/16/2019  . Polyarthralgia 03/07/2019  . History of abnormal cervical Pap smear 01/28/2018  . Allergic rhinitis   . GERD (gastroesophageal reflux disease)   . Migraine without aura, without mention of intractable migraine without mention of status migrainosus 07/09/2013    Past Surgical History:  Procedure Laterality Date  . ADENOIDECTOMY    . ESSURE TUBAL LIGATION  07-05-2014  . THERAPEUTIC ABORTION    . TONSILLECTOMY    . TYMPANOSTOMY TUBE PLACEMENT      OB History    Gravida  5   Para  4   Term  4   Preterm      AB  1   Living  4     SAB      TAB  1   Ectopic      Multiple      Live Births  4            Home Medications    Prior to Admission medications   Medication Sig Start Date End Date  Taking? Authorizing Provider  doxycycline (VIBRAMYCIN) 100 MG capsule Take 1 capsule (100 mg total) by mouth 2 (two) times daily. 04/10/20   Nafziger, Tommi Rumps, NP  Multiple Vitamin (MULTIVITAMIN) tablet Take 1 tablet by mouth daily.    [provider]  predniSONE (DELTASONE) 20 MG tablet Take 1 tablet (20 mg total) by mouth daily with breakfast. 04/10/20   Dorothyann Peng, NP    Family History Family History  Problem Relation Age of Onset  . Hypertension Mother   . Sleep apnea Mother   . Obesity Mother   . Heart disease Father   . Alcohol abuse Father        dying from this  . Diabetes Father   . High blood pressure Father   . Anxiety disorder Father   . AAA (abdominal aortic aneurysm) Father   . Mental retardation Maternal Uncle   . Diabetes Maternal Grandmother   . Hypertension Maternal Grandmother   . Kidney disease Maternal Grandmother   . Heart disease Maternal Grandmother   . Stroke Maternal Grandmother   . Prostate cancer Maternal Grandfather   . Alcohol abuse Other   . Hypertension Paternal Grandmother  Social History Social History   Tobacco Use  . Smoking status: Never Smoker  . Smokeless tobacco: Never Used  Vaping Use  . Vaping Use: Never used  Substance Use Topics  . Alcohol use: Yes    Comment: rarely  . Drug use: No     Allergies   Decongestant [pseudoephedrine], Iodinated diagnostic agents, and Percocet [oxycodone-acetaminophen]   Review of Systems Review of Systems  Constitutional: Negative for activity change, appetite change, chills, fatigue and fever.  HENT: Negative for congestion, ear pain, rhinorrhea, sinus pressure, sore throat and trouble swallowing.   Eyes: Negative for discharge and redness.  Respiratory: Negative for cough, chest tightness and shortness of breath.   Cardiovascular: Negative for chest pain.  Gastrointestinal: Negative for abdominal pain, diarrhea, nausea and vomiting.  Musculoskeletal: Negative for myalgias.    Skin: Negative for rash.  Neurological: Negative for dizziness, light-headedness and headaches.     Physical Exam Triage Vital Signs ED Triage Vitals  Enc Vitals Group     BP 05/04/20 1155 118/84     Pulse Rate 05/04/20 1155 88     Resp 05/04/20 1155 16     Temp 05/04/20 1155 99.2 F (37.3 C)     Temp src --      SpO2 05/04/20 1155 99 %     Weight --      Height --      Head Circumference --      Peak Flow --      Pain Score 05/04/20 1158 0     Pain Loc --      Pain Edu? --      Excl. in [place]? --    No data found.  Updated Vital Signs BP 118/84   Pulse 88   Temp 99.2 F (37.3 C)   Resp 16   LMP 04/13/2020 (Approximate)   SpO2 99%   Visual Acuity Right Eye Distance:   Left Eye Distance:   Bilateral Distance:    Right Eye Near:   Left Eye Near:    Bilateral Near:     Physical Exam Vitals and nursing note reviewed.  Constitutional:      Appearance: She is well-developed.     Comments: No acute distress  HENT:     Head: Normocephalic and atraumatic.     Ears:     Comments: Bilateral ears without tenderness to palpation of external auricle, tragus and mastoid, EAC's without erythema or swelling, TM's with good bony landmarks and cone of light. Non erythematous.     Nose: Nose normal.     Mouth/Throat:     Comments: Oral mucosa pink and moist, no tonsillar enlargement or exudate. Posterior pharynx patent and nonerythematous, no uvula deviation or swelling. Normal phonation.  Eyes:     Conjunctiva/sclera: Conjunctivae normal.  Cardiovascular:     Rate and Rhythm: Normal rate.  Pulmonary:     Effort: Pulmonary effort is normal. No respiratory distress.     Comments: Breathing comfortably at rest, CTABL, no wheezing, rales or other adventitious sounds auscultated  Abdominal:     General: There is no distension.  Musculoskeletal:        General: Normal range of motion.     Cervical back: Neck supple.  Skin:    General: Skin is warm and dry.   Neurological:     Mental Status: She is alert and oriented to person, place, and time.      UC Treatments / Results  Labs (all labs ordered  are listed, but only abnormal results are displayed) Labs Reviewed  NOVEL CORONAVIRUS, NAA (HOSP ORDER, SEND-OUT TO REF LAB; TAT 18-24 HRS)    EKG   Radiology No results found.  Procedures Procedures (including critical care time)  Medications Ordered in UC Medications - No data to display  Initial Impression / Assessment and Plan / UC Course  I have reviewed the triage vital signs and the nursing notes.  Pertinent labs & imaging results that were available during my care of the patient were reviewed by me and considered in my medical decision making (see chart for details).     Prior sore throat, symptoms fully resolved.  Currently asymptomatic.  Repeat Covid test pending.  Recommending continued symptomatic and supportive care as needed.  Suspect likely was viral etiology.  Discussed strict return precautions. Patient verbalized understanding and is agreeable with plan.  Final Clinical Impressions(s) / UC Diagnoses   Final diagnoses:  Sore throat     Discharge Instructions     Monitor MyChart for COVID results   ED Prescriptions    None     PDMP not reviewed this encounter.   Janith Lima, [place] 05/05/20 1611

## 2020-05-20 ENCOUNTER — Encounter: Payer: Self-pay | Admitting: Family Medicine

## 2020-05-23 ENCOUNTER — Ambulatory Visit (INDEPENDENT_AMBULATORY_CARE_PROVIDER_SITE_OTHER): Payer: No Typology Code available for payment source | Admitting: Family Medicine

## 2020-05-23 ENCOUNTER — Other Ambulatory Visit: Payer: Self-pay

## 2020-05-23 ENCOUNTER — Encounter: Payer: Self-pay | Admitting: Family Medicine

## 2020-05-23 VITALS — BP 108/64 | HR 70 | Resp 12 | Ht 64.0 in | Wt 190.0 lb

## 2020-05-23 DIAGNOSIS — G43009 Migraine without aura, not intractable, without status migrainosus: Secondary | ICD-10-CM | POA: Diagnosis not present

## 2020-05-23 DIAGNOSIS — K5900 Constipation, unspecified: Secondary | ICD-10-CM

## 2020-05-23 DIAGNOSIS — R200 Anesthesia of skin: Secondary | ICD-10-CM | POA: Diagnosis not present

## 2020-05-23 DIAGNOSIS — R202 Paresthesia of skin: Secondary | ICD-10-CM | POA: Diagnosis not present

## 2020-05-23 MED ORDER — NORTRIPTYLINE HCL 10 MG PO CAPS: 10.0000 mg | ORAL_CAPSULE | Freq: Every day | ORAL | 1 refills | Status: DC

## 2020-05-23 MED ORDER — LINACLOTIDE 145 MCG PO CAPS: 145.0000 ug | ORAL_CAPSULE | Freq: Every day | ORAL | 1 refills | Status: DC

## 2020-05-23 NOTE — Progress Notes (Signed)
Chief Complaint  Patient presents with  . Migraine    numbness in left  hand   HPI: VeronicaVeronica Raymond is a 34 y.o. female with fatigue,vit D deficiency,polyarthralgia,fibromyalgia, and migraine here today with above concern.  Reporting increased migraine frequency, 3 migraines last week. This week 2 episodes. Right temporo frontal headache, pain is difficult to described. Associated dizziness, nausea, and photophobia.  "Double vision" that starts before headache. She has double vision with headaches in the past but usually it lasts a 4-5 hours but last time it last longer. She had eye exam in 08/2019 and it was normal.  Cervical pain associated with migraine, radiated to LUE. She tried Amitriptyline, stopped after a week because drowsiness.  Topamax worsened headache and caused suicidal thoughts. Similar side effects with Imitrex.  She is not taking OTC medications.  Brain MRI 04/27/20: Normal examination. No abnormality seen to explain the presenting symptoms. No evidence of demyelinating disease.  Constant finger numbness for 3 days, this is a new problem. No know alleviated factors. 02/07/20 seen for numbness and tingling in upper and lower extremities.  Lab Results  Component Value Date   TSH 1.09 02/07/2020   Lab Results  Component Value Date   WBC 6.2 02/07/2020   HGB 12.3 02/07/2020   HCT 37.5 02/07/2020   MCV 94.9 02/07/2020   PLT 406.0 (H) 02/07/2020   She received B12 1000 mcg x 4, she has not started oral B12 supplementation.  Lab Results  Component Value Date   [id number] 010 02/07/2020   Neuro evaluation on 03/28/19, Dr Posey Pronto, because numbness and tingling right upper and lower extremities.  Constipation for a month. Improved with increasing fiber intake. Abdominal generalized cramps that are not alleviated by defecation but sometimes by passing gas.  + Blood x 1 about 4 days ago, she strained, hard stool. She is now having 2 bowel movements  daily, small.  Lab Results  Component Value Date   TSH 1.09 02/07/2020   Lab Results  Component Value Date   WBC 6.2 02/07/2020   HGB 12.3 02/07/2020   HCT 37.5 02/07/2020   MCV 94.9 02/07/2020   PLT 406.0 (H) 02/07/2020    Review of Systems  Constitutional: Positive for fatigue. Negative for activity change, appetite change and fever.  HENT: Negative for mouth sores and nosebleeds.   Respiratory: Negative for cough, shortness of breath and wheezing.   Cardiovascular: Negative for chest pain, palpitations and leg swelling.  Genitourinary: Negative for decreased urine volume and hematuria.  Musculoskeletal: Positive for myalgias. Negative for gait problem and joint swelling.  Neurological: Negative for syncope, facial asymmetry and weakness.  Psychiatric/Behavioral: Negative for confusion. The patient is nervous/anxious.   Rest see pertinent poitives and negatives per HPI.  Current Outpatient Medications on File Prior to Visit  Medication Sig Dispense Refill  . Multiple Vitamin (MULTIVITAMIN) tablet Take 1 tablet by mouth daily.     No current facility-administered medications on file prior to visit.   Past Medical History:  Diagnosis Date  . Allergic rhinitis   . Anxiety   . Bartholin gland cyst 05/04/2013  . GERD (gastroesophageal reflux disease)   . Migraine without aura, without mention of intractable migraine without mention of status migrainosus 07/09/2013  . Ovarian cyst 2014  . Polyarthralgia   . Sjogren's syndrome ([place])   . Vaginal Pap smear, abnormal    Allergies  Allergen Reactions  . Decongestant [Pseudoephedrine] Other (See Comments)    SI   . Iodinated  Diagnostic Agents Hives  . Percocet [Oxycodone-Acetaminophen] Itching   Social History   Socioeconomic History  . Marital status: Single    Spouse name: Not on file  . Number of children: 3  . Years of education: college  . Highest education level: Not on file  Occupational History  . Occupation:  CMA    Employer: [employer]    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[employer]  . Smoking status: Never Smoker  . Smokeless tobacco: Never Used  Vaping Use  . Vaping Use: Never used  Substance and Sexual Activity  . Alcohol use: Yes    Comment: rarely  . Drug use: No  . Sexual activity: Not on file  Other Topics Concern  . Not on file  Social History Narrative   Lives with her 4 children      Lives in two story home      Right handed      Highest level of edu- [employer]      Social Determinants of Health   Financial Resource Strain:   . Difficulty of Paying Living Expenses: Not on file  Food Insecurity:   . Worried About Charity fundraiser in the Last Year: Not on file  . Ran Out of Food in the Last Year: Not on file  Transportation Needs:   . Lack of Transportation (Medical): Not on file  . Lack of Transportation (Non-Medical): Not on file  Physical Activity:   . Days of Exercise per Week: Not on file  . Minutes of Exercise per Session: Not on file  Stress:   . Feeling of Stress : Not on file  Social Connections:   . Frequency of Communication with Friends and Family: Not on file  . Frequency of Social Gatherings with Friends and Family: Not on file  . Attends Religious Services: Not on file  . Active Member of Clubs or Organizations: Not on file  . Attends Archivist Meetings: Not on file  . Marital Status: Not on file    Vitals:   05/23/20 1514  BP: 108/64  Pulse: 70  Resp: 12  SpO2: 99%   Body mass index is 32.61 kg/m.  Physical Exam Vitals and nursing note reviewed.  Constitutional:      General: She is not in acute distress.    Appearance: She is well-developed.  HENT:     Head: Normocephalic and atraumatic.     Mouth/Throat:     Mouth: Mucous membranes are moist.     Pharynx: Oropharynx is clear.  Eyes:     Conjunctiva/sclera: Conjunctivae normal.     Pupils: Pupils are equal, round, and reactive to light.  Cardiovascular:      Rate and Rhythm: Normal rate and regular rhythm.     Pulses:          Dorsalis pedis pulses are 2+ on the right side and 2+ on the left side.     Heart sounds: No murmur heard.   Pulmonary:     Effort: Pulmonary effort is normal. No respiratory distress.     Breath sounds: Normal breath sounds.  Abdominal:     Palpations: Abdomen is soft. There is no hepatomegaly or mass.     Tenderness: There is no abdominal tenderness.  Lymphadenopathy:     Cervical: No cervical adenopathy.  Skin:    General: Skin is warm.     Findings: No erythema or rash.  Neurological:     General: No focal deficit  present.     Mental Status: She is alert and oriented to person, place, and time.     Cranial Nerves: No cranial nerve deficit.     Gait: Gait normal.     Deep Tendon Reflexes:     Reflex Scores:      Bicep reflexes are 2+ on the right side and 2+ on the left side.      Patellar reflexes are 2+ on the right side and 2+ on the left side. Psychiatric:     Comments: Well groomed, good eye contact.   ASSESSMENT AND PLAN:  Veronica Raymond was seen today for migraine.  Diagnoses and all orders for this visit:  Migraine without aura and without status migrainosus, not intractable We discussed a few pharmacologic treatment options. She agrees with trying Nortriptyline 10 mg daily. She can increased dose of Nortriptyline from 10 mg to 20 mg in 10-14 days. If medication is not well tolerated , neuro evaluation will be recommended.  -     nortriptyline (PAMELOR) 10 MG capsule; Take 1 capsule (10 mg total) by mouth at bedtime.  Constipation, unspecified constipation type Adequate fiber and fluid intake. Linzess side effects discussed, recommend staying home the day she starts.  If she has another episode of rectal bleed with no associated constipation/hard stool, GI evaluation may be necessary.  -     linaclotide (LINZESS) 145 MCG CAPS capsule; Take 1 capsule (145 mcg total) by mouth daily before  breakfast.  Numbness and tingling We discussed possible etiologies. Not better with B12 supplementation.  Hx and examination today do not suggest a serious process. Could be related to fibromyalgia. For now I do not think imaging is needed. Instructed to monitor for new symptoms. If persistent we can have her following with neuro.   Return in about 2 months (around 07/23/2020) for HA, contipation..   Yamir Carignan G. [place], MD  [facility]. [place] office.   A few things to remember from today's visit:  Headache diary. If you tolerate Nortriptyline well you can increase dose for 10 mg to 20 mg in 10-14 days. If you do not tolerate we can place the referral for neuro. Linzaess for constipation, stay home day 1.   If you need refills please call your pharmacy. Do not use My Chart to request refills or for acute issues that need immediate attention.    Please be sure medication list is accurate. If a new problem present, please set up appointment sooner than planned today.

## 2020-05-23 NOTE — Patient Instructions (Signed)
A few things to remember from today's visit:  Headache diary. If you tolerate Nortriptyline well you can increase dose for 10 mg to 20 mg in 10-14 days. If you do not tolerate we can place the referral for neuro. Linzaess for constipation, stay home day 1.   If you need refills please call your pharmacy. Do not use My Chart to request refills or for acute issues that need immediate attention.    Please be sure medication list is accurate. If a new problem present, please set up appointment sooner than planned today.

## 2020-06-04 ENCOUNTER — Encounter: Payer: Self-pay | Admitting: Family Medicine

## 2020-06-04 ENCOUNTER — Other Ambulatory Visit: Payer: Self-pay

## 2020-06-04 ENCOUNTER — Ambulatory Visit (INDEPENDENT_AMBULATORY_CARE_PROVIDER_SITE_OTHER): Payer: No Typology Code available for payment source | Admitting: Family Medicine

## 2020-06-04 VITALS — BP 98/68 | HR 75 | Temp 98.3°F | Wt 192.0 lb

## 2020-06-04 DIAGNOSIS — T2045XA Corrosion of unspecified degree of scalp [any part], initial encounter: Secondary | ICD-10-CM | POA: Diagnosis not present

## 2020-06-04 DIAGNOSIS — T304 Corrosion of unspecified body region, unspecified degree: Secondary | ICD-10-CM
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
## 2020-06-04 NOTE — Patient Instructions (Signed)
Chemical Burn, Adult A chemical burn is an injury to the skin. The structures below the skin can also be injured, such as the lungs and other internal organs. This type of burn is caused by coming into contact with a chemical that can damage and kill tissue (caustic chemical). Common caustic chemicals are found in fertilizers, household cleaners, and drain cleaners. A chemical burn can be more serious than other burns. Some chemicals continue to cause damage even after they have been removed from the skin. What are the causes? This condition is caused by swallowing, breathing in, touching, or being touched by a caustic chemical. What increases the risk? You are more likely to get a chemical burn if you work in a place where chemicals are made, stored, or used. These include working in [employer], educational, [place], farming, and mining. What are the signs or symptoms? Symptoms of this condition depend on the type of chemical that caused the burn and the way the chemical came in contact with the body. Symptoms may continue to get worse even after the chemical has been removed.  Common symptoms include: ? Color changes of the skin. Your skin may lose color (blanch), turn red, or turn darker. ? Blistered skin. ? Rash. ? Dry, flaky skin. ? A type of acne (chloracne) that results from exposure to certain chemicals. ? Burning or aching pain. ? Itching.  If the chemical is breathed in, or inhaled, symptoms include eye or nose irritation, sore throat, or coughing.  If the chemical is swallowed (ingested) or absorbed into the body through a wound, it can damage: ? Organs, including the liver, kidneys, and bladder. ? The immune system, the nervous system, or the nose, throat, windpipe, and lungs (respiratory system). Later symptoms may include scarring, shrinking of the skin, and permanent change in skin color. How is this diagnosed? This condition is diagnosed with a medical history and physical exam. Your  health care provider will check how deep the burn is and how much of your skin surface it covers. You may be diagnosed with a:  First-degree burn, if the burn only affects the outer layer of skin (epidermis).  Second-degree burn, if the burn extends into the second layer of skin (dermis).  Third-degree burn, if the burn extends through the dermis and into deeper tissue (hypodermis). Your blood pressure, heart rate, and urine output may also be measured. If the injury is severe, you may also have:  Blood tests.  A test to check the heart's electrical activity (electrocardiogram, or ECG).  A chest X-ray. How is this treated? This condition may be treated by removing the caustic chemical. The skin will be washed or brushed to remove the chemical. Clothes will be removed if they have the chemical on them. After the chemical is removed, you may receive:  Oxygen to help you breathe.  Antibiotic medicine to fight infection.  Pain medicine.  Fluids through an IV.  Bandages (dressings).  A procedure to remove dead tissue (debridement).  A tetanus shot. Long-term burn care may include:  Breathing support. You may be given oxygen using a machine (ventilator).  Frequent wound dressing changes.  Antibiotics.  Surgery, including debridement, skin graft, or repairing of damaged tissue or structures.  Physical therapy. Follow these instructions at home: Medicines  Take and apply over-the-counter and prescription medicines only as told by your health care provider.  If you were prescribed an antibiotic medicine, take or apply it as told by your health care provider. Do not stop  using the antibiotic even if you start to feel better. Burn care  Follow instructions from your health care provider about how to take care of your burn, including: ? How to clean your burn. ? When and how you should remove or change your dressing.  Check your burn every day for signs of infection. Check  for: ? More redness, swelling, or pain. ? Fluid or blood. ? Warmth. ? Pus or a bad smell.  Keep the dressing dry until your health care provider says it can be removed.  Do not take baths, swim, use a hot tub, or do anything that would put your burn underwater until your health care provider approves. Ask your health care provider if you may take showers. You may only be allowed to take sponge baths.  Do not put ice on your burn. This can cause more damage. Activity  Rest as told by your health care provider. Do not exercise until your health care provider approves.  Do range-of-motion movements, if told by your health care provider. General instructions  Raise (elevate) the injured area above the level of your heart while you are sitting or lying down.  Do not scratch or pick at the burn.  Do not break any blisters you may have. Do not peel any skin.  Protect your burn from the sun.  Drink enough fluid to keep your urine pale yellow.  Do not put butter, oil, or other home remedies on your burn. This can lead to an infection.  Do not use any products that contain nicotine or tobacco, such as cigarettes, e-cigarettes, and chewing tobacco. These can delay healing. If you need help quitting, ask your health care provider.  Keep all follow-up visits as told by your health care provider. This is important. How is this prevented?  Avoid exposure to caustic chemicals.  Wear protective gloves and equipment when you handle caustic chemicals.  Make sure all caustic chemicals are labeled.  Talk to your employer about the caustic chemicals that they use. Ask whether those can be replaced with chemicals that are less harmful.  Make sure there is proper airflow (ventilation) in any area with caustic chemicals.  Keep your skin clean and moisturized. Dry skin is more likely to be damaged by chemicals. Contact a health care provider if:  You received a tetanus shot and you have any of  the following symptoms at the injection site: ? Swelling. ? Severe pain. ? Redness. ? Bleeding.  Your symptoms do not improve with treatment.  Your pain is not controlled with medicine.  You have more redness, swelling, or pain around your burn.  Your burn feels warm to the touch. Get help right away if you:  Develop any signs of infection such as: ? Red streaks near the burn. ? Fluid, blood, or pus coming from the burn. ? A bad smell coming from your burn.  Develop severe swelling.  Develop severe pain.  Have a fever.  Have numbness or tingling in the burned area or farther down your legs or arms.  Have trouble breathing, or you develop coughing or noisy breathing (wheezing).  Have chest pain. Summary  A chemical burn is an injury to the skin that is caused by a caustic chemical. Some chemicals continue to cause damage even after they have been removed from the skin.  This condition is more likely to develop in people who are exposed to chemicals at work.  Avoid exposure to caustic chemicals that can cause burns.  Wear protective gloves and equipment when you handle dangerous chemicals. This information is not intended to replace advice given to you by your health care provider. Make sure you discuss any questions you have with your health care provider. Document Revised: 02/27/2019 Document Reviewed: 02/27/2019 Elsevier Patient Education  [place], Adult A burn is an injury to the skin or the tissues under the skin. There are three types of burns:  First degree. These burns may cause the skin to be red and a bit swollen.  Second degree. These burns are very painful and cause the skin to be very red. The skin may also leak fluid, look shiny, and start to have blisters.  Third degree. These burns cause permanent damage. They turn the skin white or black and make it look charred, dry, and leathery. Taking care of your burn properly can help to  prevent pain and infection. It can also help the burn to heal more quickly. How is this treated? Right after a burn:  Rinse or soak the burn under cool water. Do this for several minutes. Do not put ice on your burn. That can cause more damage.  Lightly cover the burn with a clean (sterile) cloth (dressing). Burn care  Raise (elevate) the injured area above the level of your heart while sitting or lying down.  Follow instructions from your doctor about: ? How to clean and take care of the burn. ? When to change and remove the cloth.  Check your burn every day for signs of infection. Check for: ? More redness, swelling, or pain. ? Warmth. ? Pus or a bad smell. Medicine   Take over-the-counter and prescription medicines only as told by your doctor.  If you were prescribed antibiotic medicine, take or apply it as told by your doctor. Do not stop using the antibiotic even if your condition improves. General instructions  To prevent infection: ? Do not put butter, oil, or other home treatments on the burn. ? Do not scratch or pick at the burn. ? Do not break any blisters. ? Do not peel skin.  Do not rub your burn, even when you are cleaning it.  Protect your burn from the sun. Contact a doctor if:  Your condition does not get better.  Your condition gets worse.  You have a fever.  Your burn looks different or starts to have black or red spots on it.  Your burn feels warm to the touch.  Your pain is not controlled with medicine. Get help right away if:  You have redness, swelling, or pain at the site of the burn.  You have fluid, blood, or pus coming from your burn.  You have red streaks near the burn.  You have very bad pain. This information is not intended to replace advice given to you by your health care provider. Make sure you discuss any questions you have with your health care provider. Document Revised: 01/03/2019 Document Reviewed: 03/02/2016 Elsevier  Patient Education  [place].

## 2020-06-12 ENCOUNTER — Encounter: Payer: Self-pay | Admitting: Family Medicine

## 2020-06-12 ENCOUNTER — Other Ambulatory Visit: Payer: Self-pay

## 2020-06-12 ENCOUNTER — Ambulatory Visit (INDEPENDENT_AMBULATORY_CARE_PROVIDER_SITE_OTHER): Payer: No Typology Code available for payment source | Admitting: Family Medicine

## 2020-06-12 VITALS — BP 98/74 | HR 95

## 2020-06-12 DIAGNOSIS — R0789 Other chest pain: Secondary | ICD-10-CM

## 2020-06-12 DIAGNOSIS — Z789 Other specified health status: Secondary | ICD-10-CM

## 2020-06-12 DIAGNOSIS — D473 Essential (hemorrhagic) thrombocythemia: Secondary | ICD-10-CM | POA: Diagnosis not present

## 2020-06-12 DIAGNOSIS — K219 Gastro-esophageal reflux disease without esophagitis: Secondary | ICD-10-CM | POA: Diagnosis not present

## 2020-06-12 DIAGNOSIS — D75839 Thrombocytosis, unspecified: Secondary | ICD-10-CM

## 2020-06-12 NOTE — Progress Notes (Signed)
Subjective:    Patient ID: Veronica Raymond, female    DOB: [birth year], 34 y.o.   MRN: [id number]  No chief complaint on file.   HPI Patient was seen today for acute concern.  Pt endorses flank pain started over a month ago.  Sensation noted as a "hollow feeling".  Pt then developed epigastric pain and chest pain 2 wks ago noted as something "squeezing heart".  Pt tried pantoprazole for symptoms and diet modification with little relief.  Pt notes squeezing sensation worse with lying flat.  Pt ran an EKG this am and has the copy with her.  Pt inquires about having labs to check plts as they were elevated in the past.  Also interested to know if she has abs to COVID.  Past Medical History:  Diagnosis Date  . Allergic rhinitis   . Anxiety   . Bartholin gland cyst 05/04/2013  . GERD (gastroesophageal reflux disease)   . Migraine without aura, without mention of intractable migraine without mention of status migrainosus 07/09/2013  . Ovarian cyst 2014  . Polyarthralgia   . Sjogren's syndrome ([place])   . Vaginal Pap smear, abnormal     Allergies  Allergen Reactions  . Decongestant [Pseudoephedrine] Other (See Comments)    SI   . Iodinated Diagnostic Agents Hives  . Percocet [Oxycodone-Acetaminophen] Itching    ROS General: Denies fever, chills, night sweats, changes in weight, changes in appetite HEENT: Denies headaches, ear pain, changes in vision, rhinorrhea, sore throat CV: Denies palpitations, SOB, orthopnea  + CP Pulm: Denies SOB, cough, wheezing GI: Denies abdominal pain, nausea, vomiting, diarrhea, constipation +GERD, L flank pain GU: Denies dysuria, hematuria, frequency, vaginal discharge Msk: Denies muscle cramps, joint pains Neuro: Denies weakness, numbness, tingling Skin: Denies rashes, bruising Psych: Denies depression, anxiety, hallucinations     Objective:    Blood pressure 98/74, pulse 95, last menstrual period 06/11/2020, SpO2 99 %.   Gen. Pleasant,  well-nourished, in no distress, normal affect   HEENT: [place]/AT, face symmetric, conjunctiva clear, no scleral icterus, PERRLA, EOMI, nares patent without drainage Lungs: no accessory muscle use, CTAB, no wheezes or rales Cardiovascular: RRR, no m/r/g, no peripheral edema Abdomen: BS present, soft, NT/ND, no hepatosplenomegaly. Musculoskeletal: No TTP of chest wall, No deformities, no cyanosis or clubbing, normal tone Neuro:  A&Ox3, CN II-XII intact, normal gait Skin:  Warm, no lesions/ rash   Wt Readings from Last 3 Encounters:  06/04/20 192 lb (87.1 kg)  05/23/20 190 lb (86.2 kg)  04/10/20 190 lb (86.2 kg)    Lab Results  Component Value Date   WBC 6.2 02/07/2020   HGB 12.3 02/07/2020   HCT 37.5 02/07/2020   PLT 406.0 (H) 02/07/2020   GLUCOSE 82 08/16/2019   CHOL 183 08/16/2019   TRIG 89 08/16/2019   HDL 55 08/16/2019   LDLCALC 112 (H) 08/16/2019   ALT 8 08/16/2019   AST 13 08/16/2019   NA 140 08/16/2019   K 4.5 08/16/2019   CL 102 08/16/2019   CREATININE 0.83 08/16/2019   BUN 13 08/16/2019   CO2 25 08/16/2019   TSH 1.09 02/07/2020   HGBA1C 5.5 08/16/2019    Assessment/Plan:  Other chest pain  -discussed possible causes. EKG from pt reviewed and normal.  Prior studies viewed for comparison. -discussed obtaining CXR to evaluate for infection, enlargement of heart, etc. - Plan: DG Chest 2 View, CBC with Differential/Platelet, CMP with eGFR([facility]), Lipase  Thrombocytosis (HCC)  -plts mildly elevated 02/07/20 at 406 - Plan:  CBC with Differential/Platelet  Gastroesophageal reflux disease, unspecified whether esophagitis present  -continue PPI -avoid foods known to cause problems - Plan: CMP with eGFR([facility]), Lipase  Unknown status of immunity to COVID-19 virus  - Plan: SARS CoV2 Serology(COVID19) AB(IgG,IgM),Immunoassay  F/u  prn  Grier Mitts, MD

## 2020-06-12 NOTE — Patient Instructions (Signed)

## 2020-06-13 ENCOUNTER — Ambulatory Visit (INDEPENDENT_AMBULATORY_CARE_PROVIDER_SITE_OTHER)
Admission: RE | Admit: 2020-06-13 | Discharge: 2020-06-13 | Disposition: A | Discharge status: Home / Self Care | Payer: No Typology Code available for payment source | Source: Ambulatory Visit | Attending: Family Medicine | Admitting: Family Medicine

## 2020-06-13 DIAGNOSIS — R0789 Other chest pain: Secondary | ICD-10-CM

## 2020-06-18 ENCOUNTER — Encounter: Payer: Self-pay | Admitting: Family Medicine

## 2020-06-20 LAB — COMPLETE METABOLIC PANEL WITH GFR
AG Ratio: 1.3 (calc) (ref 1.0–2.5)
ALT: 10 U/L (ref 6–29)
AST: 13 U/L (ref 10–30)
Albumin: 4.2 g/dL (ref 3.6–5.1)
Alkaline phosphatase (APISO): 56 U/L (ref 31–125)
BUN: 15 mg/dL (ref 7–25)
CO2: 29 mmol/L (ref 20–32)
Calcium: 9.7 mg/dL (ref 8.6–10.2)
Chloride: 102 mmol/L (ref 98–110)
Creat: 0.84 mg/dL (ref 0.50–1.10)
GFR, Est African American: 106 mL/min/{1.73_m2} (ref >=60)
GFR, Est Non African American: 91 mL/min/{1.73_m2} (ref >=60)
Globulin: 3.2 g/dL (calc) (ref 1.9–3.7)
Glucose, Bld: 84 mg/dL (ref 65–99)
Potassium: 4 mmol/L (ref 3.5–5.3)
Sodium: 138 mmol/L (ref 135–146)
Total Bilirubin: 0.3 mg/dL (ref 0.2–1.2)
Total Protein: 7.4 g/dL (ref 6.1–8.1)

## 2020-06-20 LAB — CBC WITH DIFFERENTIAL/PLATELET
Absolute Monocytes: 506 cells/uL (ref 200–950)
Basophils Absolute: 38 cells/uL (ref 0–200)
Basophils Relative: 0.6 %
Eosinophils Absolute: 90 cells/uL (ref 15–500)
Eosinophils Relative: 1.4 %
HCT: 36.1 % (ref 35.0–45.0)
Hemoglobin: 12.2 g/dL (ref 11.7–15.5)
Lymphs Abs: 3034 cells/uL (ref 850–3900)
MCH: 30.7 pg (ref 27.0–33.0)
MCHC: 33.8 g/dL (ref 32.0–36.0)
MCV: 90.9 fL (ref 80.0–100.0)
MPV: 9.5 fL (ref 7.5–12.5)
Monocytes Relative: 7.9 %
Neutro Abs: 2733 cells/uL (ref 1500–7800)
Neutrophils Relative %: 42.7 %
Platelets: 349 10*3/uL (ref 140–400)
RBC: 3.97 10*6/uL (ref 3.80–5.10)
RDW: 12 % (ref 11.0–15.0)
Total Lymphocyte: 47.4 %
WBC: 6.4 10*3/uL (ref 3.8–10.8)

## 2020-06-20 LAB — TEST AUTHORIZATION

## 2020-06-20 LAB — SARS COV-2 SEROLOGY(COVID-19)AB(IGG,IGM),IMMUNOASSAY
SARS CoV-2 AB IgG: POSITIVE — AB
SARS CoV-2 IgM: NEGATIVE

## 2020-06-20 LAB — LIPASE: Lipase: 46 U/L (ref 7–60)

## 2020-06-24 ENCOUNTER — Encounter: Payer: Self-pay | Admitting: Family Medicine

## 2020-06-24 DIAGNOSIS — M3500 Sicca syndrome, unspecified: Secondary | ICD-10-CM | POA: Insufficient documentation

## 2020-06-24 DIAGNOSIS — M797 Fibromyalgia: Secondary | ICD-10-CM | POA: Insufficient documentation

## 2020-06-25 ENCOUNTER — Encounter: Payer: Self-pay | Admitting: Family Medicine

## 2020-08-27 ENCOUNTER — Encounter: Payer: Self-pay | Admitting: Family Medicine

## 2020-08-29 ENCOUNTER — Ambulatory Visit (INDEPENDENT_AMBULATORY_CARE_PROVIDER_SITE_OTHER): Payer: Medicaid Other | Admitting: Family Medicine

## 2020-08-29 ENCOUNTER — Other Ambulatory Visit: Payer: Self-pay

## 2020-08-29 ENCOUNTER — Encounter: Payer: Self-pay | Admitting: Family Medicine

## 2020-08-29 VITALS — BP 98/78 | HR 81 | Temp 98.4°F | Wt 184.0 lb

## 2020-08-29 DIAGNOSIS — H6121 Impacted cerumen, right ear: Secondary | ICD-10-CM

## 2020-08-29 DIAGNOSIS — H6502 Acute serous otitis media, left ear: Secondary | ICD-10-CM

## 2020-08-29 DIAGNOSIS — R0982 Postnasal drip: Secondary | ICD-10-CM | POA: Diagnosis not present

## 2020-08-29 DIAGNOSIS — H9319 Tinnitus, unspecified ear: Secondary | ICD-10-CM | POA: Diagnosis not present

## 2020-08-29 MED ORDER — FLUTICASONE PROPIONATE 50 MCG/ACT NA SUSP: 1.0000 | Freq: Every day | NASAL | 0 refills | Status: DC

## 2020-08-29 MED ORDER — AMOXICILLIN-POT CLAVULANATE 875-125 MG PO TABS: 1.0000 | ORAL_TABLET | Freq: Two times a day (BID) | ORAL | 0 refills | Status: AC

## 2020-08-29 NOTE — Progress Notes (Signed)
Subjective:    Patient ID: Veronica Raymond, female    DOB: 1986/03/30, 34 y.o.   MRN: [id number]  No chief complaint on file.   HPI Patient was seen today for acute concern.  Patient endorses left ear ringing/whooshing sound causing headache.  Symptoms began after patient had a cold last week.  Pt endorses post nasal drainage and tension in neck causing a HA.  PT states the constant "whooshing" sound is also causing a HA.  Pt used Afrin nasal spray for her cold symptoms.  Past Medical History:  Diagnosis Date  . Allergic rhinitis   . Anxiety   . Bartholin gland cyst 05/04/2013  . GERD (gastroesophageal reflux disease)   . Migraine without aura, without mention of intractable migraine without mention of status migrainosus 07/09/2013  . Ovarian cyst 2014  . Polyarthralgia   . Sjogren's syndrome ([place])   . Vaginal Pap smear, abnormal     Allergies  Allergen Reactions  . Decongestant [Pseudoephedrine] Other (See Comments)    SI   . Iodinated Diagnostic Agents Hives  . Percocet [Oxycodone-Acetaminophen] Itching    ROS General: Denies fever, chills, night sweats, changes in weight, changes in appetite HEENT: Denies headaches, ear pain, changes in vision, rhinorrhea, sore throat  +"whoosing in ears", HA, nasal congestion, post nasal drainage CV: Denies CP, palpitations, SOB, orthopnea Pulm: Denies SOB, cough, wheezing GI: Denies abdominal pain, nausea, vomiting, diarrhea, constipation GU: Denies dysuria, hematuria, frequency, vaginal discharge Msk: Denies muscle cramps, joint pains  +tension HA Neuro: Denies weakness, numbness, tingling Skin: Denies rashes, bruising Psych: Denies depression, anxiety, hallucinations     Objective:    Blood pressure 98/78, pulse 81, temperature 98.4 F (36.9 C), temperature source Oral, weight 184 lb (83.5 kg), SpO2 99 %.  Gen. Pleasant, well-nourished, in no distress, normal affect   HEENT: [place]/AT, face symmetric, conjunctiva clear, no scleral  icterus, PERRLA, EOMI, nares patent without drainage, pharynx with mild erythema, no exudate.  L TM full with suppurative fluid and mild erythema.  R canal partially occluded with cerumen.  R TM normal.  B/l cervical lymphadenopathy. Lungs: no accessory muscle use, CTAB, no wheezes or rales Cardiovascular: RRR, no m/r/g, no peripheral edema Musculoskeletal: No deformities, no cyanosis or clubbing, normal tone Neuro:  A&Ox3, CN II-XII intact, normal gait Skin:  Warm, no lesions/ rash  Wt Readings from Last 3 Encounters:  08/29/20 184 lb (83.5 kg)  06/04/20 192 lb (87.1 kg)  05/23/20 190 lb (86.2 kg)    Lab Results  Component Value Date   WBC 6.4 06/12/2020   HGB 12.2 06/12/2020   HCT 36.1 06/12/2020   PLT 349 06/12/2020   GLUCOSE 84 06/12/2020   CHOL 183 08/16/2019   TRIG 89 08/16/2019   HDL 55 08/16/2019   LDLCALC 112 (H) 08/16/2019   ALT 10 06/12/2020   AST 13 06/12/2020   NA 138 06/12/2020   K 4.0 06/12/2020   CL 102 06/12/2020   CREATININE 0.84 06/12/2020   BUN 15 06/12/2020   CO2 29 06/12/2020   TSH 1.09 02/07/2020   HGBA1C 5.5 08/16/2019    Assessment/Plan:  Non-recurrent acute serous otitis media of left ear -Discussed supportive care: NSAIDs as needed for any pain/discomfort - Plan: amoxicillin-clavulanate (AUGMENTIN) 875-125 MG tablet  Post-nasal drainage -Discussed using Flonase nasal spray prn -Plan: Flonase  Tinnitus, unspecified laterality -Likely 2/2 AOM -We will reevaluate if not resolved after completion of antibiotic  Impacted cerumen of right ear  F/u prrn  Grier Mitts,  MD

## 2020-08-29 NOTE — Patient Instructions (Signed)
Otitis Media, Adult  Otitis media occurs when there is inflammation and fluid in the middle ear. Your middle ear is a part of the ear that contains bones for hearing as well as air that helps send sounds to your brain. What are the causes? This condition is caused by a blockage in the eustachian tube. This tube drains fluid from the ear to the back of the nose (nasopharynx). A blockage in this tube can be caused by an object or by swelling (edema) in the tube. Problems that can cause a blockage include:  A cold or other upper respiratory infection.  Allergies.  An irritant, such as tobacco smoke.  Enlarged adenoids. The adenoids are areas of soft tissue located high in the back of the throat, behind the nose and the roof of the mouth.  A mass in the nasopharynx.  Damage to the ear caused by pressure changes (barotrauma). What are the signs or symptoms? Symptoms of this condition include:  Ear pain.  A fever.  Decreased hearing.  A headache.  Tiredness (lethargy).  Fluid leaking from the ear.  Ringing in the ear. How is this diagnosed? This condition is diagnosed with a physical exam. During the exam your health care provider will use an instrument called an otoscope to look into your ear and check for redness, swelling, and fluid. He or she will also ask about your symptoms. Your health care provider may also order tests, such as:  A test to check the movement of the eardrum (pneumatic otoscopy). This test is done by squeezing a small amount of air into the ear.  A test that changes air pressure in the middle ear to check how well the eardrum moves and whether the eustachian tube is working (tympanogram). How is this treated? This condition usually goes away on its own within 3-5 days. But if the condition is caused by a bacteria infection and does not go away own its own, or keeps coming back, your health care provider may:  Prescribe antibiotic medicines to treat the  infection.  Prescribe or recommend medicines to control pain. Follow these instructions at home:  Take over-the-counter and prescription medicines only as told by your health care provider.  If you were prescribed an antibiotic medicine, take it as told by your health care provider. Do not stop taking the antibiotic even if you start to feel better.  Keep all follow-up visits as told by your health care provider. This is important. Contact a health care provider if:  You have bleeding from your nose.  There is a lump on your neck.  You are not getting better in 5 days.  You feel worse instead of better. Get help right away if:  You have severe pain that is not controlled with medicine.  You have swelling, redness, or pain around your ear.  You have stiffness in your neck.  A part of your face is paralyzed.  The bone behind your ear (mastoid) is tender when you touch it.  You develop a severe headache. Summary  Otitis media is redness, soreness, and swelling of the middle ear.  This condition usually goes away on its own within 3-5 days.  If the problem does not go away in 3-5 days, your health care provider may prescribe or recommend medicines to treat your symptoms.  If you were prescribed an antibiotic medicine, take it as told by your health care provider. This information is not intended to replace advice given to you by   your health care provider. Make sure you discuss any questions you have with your health care provider. Document Revised: 08/26/2017 Document Reviewed: 09/03/2016 Elsevier Patient Education  [place].  Postnasal Drip Postnasal drip is the feeling of mucus going down the back of your throat. Mucus is a slimy substance that moistens and cleans your nose and throat, as well as the air pockets in face bones near your forehead and cheeks (sinuses). Small amounts of mucus pass from your nose and sinuses down the back of your throat all the time.  This is normal. When you produce too much mucus or the mucus gets too thick, you can feel it. Some common causes of postnasal drip include:  Having more mucus because of: ? A cold or the flu. ? Allergies. ? Cold air. ? Certain medicines.  Having more mucus that is thicker because of: ? A sinus or nasal infection. ? Dry air. ? A food allergy. Follow these instructions at home: Relieving discomfort   Gargle with a salt-water mixture 3-4 times a day or as needed. To make a salt-water mixture, completely dissolve -1 tsp of salt in 1 cup of warm water.  If the air in your home is dry, use a humidifier to add moisture to the air.  Use a saline spray or container (neti pot) to flush out the nose (nasal irrigation). These methods can help clear away mucus and keep the nasal passages moist. General instructions  Take over-the-counter and prescription medicines only as told by your health care provider.  Follow instructions from your health care provider about eating or drinking restrictions. You may need to avoid caffeine.  Avoid things that you know you are allergic to (allergens), like dust, mold, pollen, pets, or certain foods.  Drink enough fluid to keep your urine pale yellow.  Keep all follow-up visits as told by your health care provider. This is important. Contact a health care provider if:  You have a fever.  You have a sore throat.  You have difficulty swallowing.  You have headache.  You have sinus pain.  You have a cough that does not go away.  The mucus from your nose becomes thick and is green or yellow in color.  You have cold or flu symptoms that last more than 10 days. Summary  Postnasal drip is the feeling of mucus going down the back of your throat.  If your health care provider approves, use nasal irrigation or a nasal spray 2?4 times a day.  Avoid things that you know you are allergic to (allergens), like dust, mold, pollen, pets, or certain  foods. This information is not intended to replace advice given to you by your health care provider. Make sure you discuss any questions you have with your health care provider. Document Revised: 01/05/2019 Document Reviewed: 12/27/2016 Elsevier Patient Education  [place].  Tinnitus Tinnitus refers to hearing a sound when there is no actual source for that sound. This is often described as ringing in the ears. However, people with this condition may hear a variety of noises, in one ear or in both ears. The sounds of tinnitus can be soft, loud, or somewhere in between. Tinnitus can last for a few seconds or can be constant for days. It may go away without treatment and come back at various times. When tinnitus is constant or happens often, it can lead to other problems, such as trouble sleeping and trouble concentrating. Almost everyone experiences tinnitus at some point.  Tinnitus that is long-lasting (chronic) or comes back often (recurs) may require medical attention. What are the causes? The cause of tinnitus is often not known. In some cases, it can result from:  Exposure to loud noises from machinery, music, or other sources.  An object (foreign body) stuck in the ear.  Earwax buildup.  Drinking alcohol or caffeine.  Taking certain medicines.  Age-related hearing loss. It may also be caused by medical conditions such as:  Ear or sinus infections.  High blood pressure.  Heart diseases.  Anemia.  Allergies.  Meniere's disease.  Thyroid problems.  Tumors.  A weak, bulging blood vessel (aneurysm) near the ear. What are the signs or symptoms? The main symptom of tinnitus is hearing a sound when there is no source for that sound. It may sound like:  Buzzing.  Roaring.  Ringing.  Blowing air.  Hissing.  Whistling.  Sizzling.  Humming.  Running water.  A musical note.  Tapping. Symptoms may affect only one ear (unilateral) or both ears  (bilateral). How is this diagnosed? Tinnitus is diagnosed based on your symptoms, your medical history, and a physical exam. Your health care provider may do a thorough hearing test (audiologic exam) if your tinnitus:  Is unilateral.  Causes hearing difficulties.  Lasts 6 months or longer. You may work with a health care provider who specializes in hearing disorders (audiologist). You may be asked questions about your symptoms and how they affect your daily life. You may have other tests done, such as:  CT scan.  MRI.  An imaging test of how blood flows through your blood vessels (angiogram). How is this treated? Treating an underlying medical condition can sometimes make tinnitus go away. If your tinnitus continues, other treatments may include:  Medicines.  Therapy and counseling to help you manage the stress of living with tinnitus.  Sound generators to mask the tinnitus. These include: ? Tabletop sound machines that play relaxing sounds to help you fall asleep. ? Wearable devices that fit in your ear and play sounds or music. ? Acoustic neural stimulation. This involves using headphones to listen to music that contains an auditory signal. Over time, listening to this signal may change some pathways in your brain and make you less sensitive to tinnitus. This treatment is used for very severe cases when no other treatment is working.  Using hearing aids or cochlear implants if your tinnitus is related to hearing loss. Hearing aids are worn in the outer ear. Cochlear implants are surgically placed in the inner ear. Follow these instructions at home: Managing symptoms      When possible, avoid being in loud places and being exposed to loud sounds.  Wear hearing protection, such as earplugs, when you are exposed to loud noises.  Use a white noise machine, a humidifier, or other devices to mask the sound of tinnitus.  Practice techniques for reducing stress, such as meditation,  yoga, or deep breathing. Work with your health care provider if you need help with managing stress.  Sleep with your head slightly raised. This may reduce the impact of tinnitus. General instructions  Do not use stimulants, such as nicotine, alcohol, or caffeine. Talk with your health care provider about other stimulants to avoid. Stimulants are substances that can make you feel alert and attentive by increasing certain activities in the body (such as heart rate and blood pressure). These substances may make tinnitus worse.  Take over-the-counter and prescription medicines only as told by your health  care provider.  Try to get plenty of sleep each night.  Keep all follow-up visits as told by your health care provider. This is important. Contact a health care provider if:  Your tinnitus continues for 3 weeks or longer without stopping.  You develop sudden hearing loss.  Your symptoms get worse or do not get better with home care.  You feel you are not able to manage the stress of living with tinnitus. Get help right away if:  You develop tinnitus after a head injury.  You have tinnitus along with any of the following: ? Dizziness. ? Loss of balance. ? Nausea and vomiting. ? Sudden, severe headache. These symptoms may represent a serious problem that is an emergency. Do not wait to see if the symptoms will go away. Get medical help right away. Call your local emergency services (911 in the [place] Do not drive yourself to the hospital. Summary  Tinnitus refers to hearing a sound when there is no actual source for that sound. This is often described as ringing in the ears.  Symptoms may affect only one ear (unilateral) or both ears (bilateral).  Use a white noise machine, a humidifier, or other devices to mask the sound of tinnitus.  Do not use stimulants, such as nicotine, alcohol, or caffeine. Talk with your health care provider about other stimulants to avoid. These substances  may make tinnitus worse. This information is not intended to replace advice given to you by your health care provider. Make sure you discuss any questions you have with your health care provider. Document Revised: 03/28/2019 Document Reviewed: 06/23/2017 Elsevier Patient Education  2020 [employer].

## 2020-09-11 ENCOUNTER — Other Ambulatory Visit: Payer: Self-pay

## 2020-09-11 ENCOUNTER — Ambulatory Visit (HOSPITAL_COMMUNITY)
Admission: EM | Admit: 2020-09-11 | Discharge: 2020-09-11 | Disposition: A | Discharge status: Home / Self Care | Payer: Medicaid Other | Attending: Internal Medicine | Admitting: Internal Medicine

## 2020-09-11 ENCOUNTER — Encounter: Payer: Self-pay | Admitting: Family Medicine

## 2020-09-11 ENCOUNTER — Encounter (HOSPITAL_COMMUNITY): Payer: Self-pay | Admitting: Emergency Medicine

## 2020-09-11 DIAGNOSIS — Z3202 Encounter for pregnancy test, result negative: Secondary | ICD-10-CM | POA: Diagnosis not present

## 2020-09-11 DIAGNOSIS — R519 Headache, unspecified: Secondary | ICD-10-CM | POA: Insufficient documentation

## 2020-09-11 DIAGNOSIS — R300 Dysuria: Secondary | ICD-10-CM | POA: Insufficient documentation

## 2020-09-11 DIAGNOSIS — H6592 Unspecified nonsuppurative otitis media, left ear: Secondary | ICD-10-CM | POA: Diagnosis present

## 2020-09-11 LAB — POCT URINALYSIS DIPSTICK, ED / UC
Bilirubin Urine: NEGATIVE
Glucose, UA: NEGATIVE mg/dL
Hgb urine dipstick: NEGATIVE
Ketones, ur: NEGATIVE mg/dL
Nitrite: NEGATIVE
Protein, ur: NEGATIVE mg/dL
Specific Gravity, Urine: 1.02 (ref 1.005–1.030)
Urobilinogen, UA: 0.2 mg/dL (ref 0.0–1.0)
pH: 7 (ref 5.0–8.0)

## 2020-09-11 LAB — POC URINE PREG, ED
Preg Test, Ur: NEGATIVE
Preg Test, Ur: NEGATIVE

## 2020-09-11 MED ORDER — SULFAMETHOXAZOLE-TRIMETHOPRIM 800-160 MG PO TABS: 1.0000 | ORAL_TABLET | Freq: Two times a day (BID) | ORAL | 0 refills | Status: AC

## 2020-09-11 MED ORDER — FLUCONAZOLE 150 MG PO TABS: 150.0000 mg | ORAL_TABLET | Freq: Once | ORAL | 0 refills | Status: AC

## 2020-09-11 NOTE — ED Provider Notes (Signed)
[facility]    CSN: [id number] Arrival date & time: 09/11/20  4235      History   Chief Complaint Chief Complaint  Patient presents with  . Dysuria  . Headache    HPI Veronica Raymond is a 34 y.o. female presents with abdominal cramping, dysuria and headache.  Patient reports lower abdominal cramping and left lower quadrant cramping x1 week.  Symptoms have progressed to pain with urination and low back pain.  Patient also reports intermittent throbbing headache x2 days.  Patient states she was recently treated for AOM by PCP.  She denies any recent fever or chills, blurred vision, weakness or fatigue, gross hematuria, vaginal discharge or itching, no suspicious rashes or lesions.  No abnormal vaginal bleeding. Patient requesting STI screening.   Past Medical History:  Diagnosis Date  . Allergic rhinitis   . Anxiety   . Bartholin gland cyst 05/04/2013  . GERD (gastroesophageal reflux disease)   . Migraine without aura, without mention of intractable migraine without mention of status migrainosus 07/09/2013  . Ovarian cyst 2014  . Polyarthralgia   . Sjogren's syndrome ([place])   . Vaginal Pap smear, abnormal     Patient Active Problem List   Diagnosis Date Noted  . Fibromyalgia 06/24/2020  . Sjogren's disease ([place]) 06/24/2020  . Vitamin D deficiency 08/20/2019  . Class 1 obesity due to excess calories without serious comorbidity with body mass index (BMI) of 31.0 to 31.9 in adult 08/16/2019  . Polyarthralgia 03/07/2019  . History of abnormal cervical Pap smear 01/28/2018  . Allergic rhinitis   . GERD (gastroesophageal reflux disease)   . Migraine without aura 07/09/2013    Past Surgical History:  Procedure Laterality Date  . ADENOIDECTOMY    . ESSURE TUBAL LIGATION  07-05-2014  . THERAPEUTIC ABORTION    . TONSILLECTOMY    . TYMPANOSTOMY TUBE PLACEMENT      OB History    Gravida  5   Para  4   Term  4   Preterm      AB  1   Living  4     SAB       IAB  1   Ectopic      Multiple      Live Births  4            Home Medications    Prior to Admission medications   Medication Sig Start Date End Date Taking? Authorizing Provider  fluticasone (FLONASE) 50 MCG/ACT nasal spray Place 1 spray into both nostrils daily. 08/29/20  Yes Billie Ruddy, MD  Multiple Vitamin (MULTIVITAMIN) tablet Take 1 tablet by mouth daily.   Yes [provider]  fluconazole (DIFLUCAN) 150 MG tablet Take 1 tablet (150 mg total) by mouth once for 1 dose. 09/11/20 09/11/20  Rudolpho Sevin, NP  nortriptyline (PAMELOR) 10 MG capsule Take 1 capsule (10 mg total) by mouth at bedtime. 05/23/20   [place], Betty G, MD  sulfamethoxazole-trimethoprim (BACTRIM DS) 800-160 MG tablet Take 1 tablet by mouth 2 (two) times daily for 3 days. 09/11/20 09/14/20  Rudolpho Sevin, NP    Family History Family History  Problem Relation Age of Onset  . Hypertension Mother   . Sleep apnea Mother   . Obesity Mother   . Heart disease Father   . Alcohol abuse Father        dying from this  . Diabetes Father   . High blood pressure Father   .  Anxiety disorder Father   . AAA (abdominal aortic aneurysm) Father   . Mental retardation Maternal Uncle   . Diabetes Maternal Grandmother   . Hypertension Maternal Grandmother   . Kidney disease Maternal Grandmother   . Heart disease Maternal Grandmother   . Stroke Maternal Grandmother   . Prostate cancer Maternal Grandfather   . Alcohol abuse Other   . Hypertension Paternal Grandmother     Social History Social History   Tobacco Use  . Smoking status: Never Smoker  . Smokeless tobacco: Never Used  Vaping Use  . Vaping Use: Never used  Substance Use Topics  . Alcohol use: Yes    Comment: rarely  . Drug use: No     Allergies   Decongestant [pseudoephedrine], Iodinated diagnostic agents, and Percocet [oxycodone-acetaminophen]   Review of Systems As stated in HPI otherwise negative   Physical  Exam Triage Vital Signs ED Triage Vitals  Enc Vitals Group     BP 09/11/20 0922 107/63     Pulse Rate 09/11/20 0922 73     Resp 09/11/20 0922 17     Temp 09/11/20 0922 98.4 F (36.9 C)     Temp Source 09/11/20 0922 Oral     SpO2 09/11/20 0922 100 %     Weight --      Height --      Head Circumference --      Peak Flow --      Pain Score 09/11/20 0919 9     Pain Loc --      Pain Edu? --      Excl. in [place]? --    No data found.  Updated Vital Signs BP 107/63 (BP Location: Right Arm)   Pulse 73   Temp 98.4 F (36.9 C) (Oral)   Resp 17   LMP 08/26/2020   SpO2 100%   Visual Acuity Right Eye Distance:   Left Eye Distance:   Bilateral Distance:    Right Eye Near:   Left Eye Near:    Bilateral Near:     Physical Exam Constitutional:      General: She is not in acute distress.    Appearance: She is well-developed. She is not ill-appearing.  HENT:     Head:     Comments: Patient with diffuse purulent effusion.  No erythema    Mouth/Throat:     Mouth: Mucous membranes are moist.     Pharynx: Oropharynx is clear.  Eyes:     Extraocular Movements: Extraocular movements intact.  Abdominal:     General: Bowel sounds are normal. There is no distension.     Palpations: Abdomen is soft.     Tenderness: There is no abdominal tenderness. There is no guarding.  Musculoskeletal:        General: Normal range of motion.     Cervical back: Normal range of motion and neck supple.  Lymphadenopathy:     Cervical: No cervical adenopathy.  Skin:    General: Skin is warm and dry.  Neurological:     Mental Status: She is alert.     UC Treatments / Results  Labs (all labs ordered are listed, but only abnormal results are displayed) Labs Reviewed  POCT URINALYSIS DIPSTICK, ED / UC - Abnormal; Notable for the following components:      Result Value   Leukocytes,Ua TRACE (*)    All other components within normal limits  URINE CULTURE  POC URINE PREG, ED  CERVICOVAGINAL  ANCILLARY ONLY  EKG   Radiology No results found.  Procedures Procedures (including critical care time)  Medications Ordered in UC Medications - No data to display  Initial Impression / Assessment and Plan / UC Course  I have reviewed the triage vital signs and the nursing notes.  Pertinent labs & imaging results that were available during my care of the patient were reviewed by me and considered in my medical decision making (see chart for details).  Dysuria -Exam benign, patient afebrile and nontoxic-appearing.  VSS -Trace leukocytes in urine.  Will treat empirically with Septra DS twice daily x3 days.  Sent for culture -We will also give Diflucan to take as needed given antibiotic treatment earlier this month for AOM and again today -STI screening  Serous otitis media -Recent treatment for same 1 week ago and may take time to resolve.  No recent fever and no erythema suggesting acute otitis media -Restart Flonase daily -Fluids -Follow-up PCP  Headache -No neuro deficits on exam.  Could be related to above issue -Tylenol or Motrin as needed.  Patient declined IM Toradol in office  Reviewed expections re: course of current medical issues. Questions answered. Outlined signs and symptoms indicating need for more acute intervention. Pt verbalized understanding. AVS given  Final Clinical Impressions(s) / UC Diagnoses   Final diagnoses:  Dysuria  Left serous otitis media, unspecified chronicity  Acute nonintractable headache, unspecified headache type     Discharge Instructions     I am treating you today for UTI.  Take antibiotic until complete.  I will also call in prescription for Diflucan for any yeast infection.  We have also checked you today for sexually transmitted infections.  You will receive follow-up if any treatment is indicated.  In regards to your left ear, add Flonase to daily regimen and increase fluids.  Follow-up for any worsening or persistent  symptoms    ED Prescriptions    Medication Sig Dispense Auth. Provider   sulfamethoxazole-trimethoprim (BACTRIM DS) 800-160 MG tablet Take 1 tablet by mouth 2 (two) times daily for 3 days. 6 tablet Rudolpho Sevin, NP   fluconazole (DIFLUCAN) 150 MG tablet Take 1 tablet (150 mg total) by mouth once for 1 dose. 1 tablet Rudolpho Sevin, NP     PDMP not reviewed this encounter.   Rudolpho Sevin, NP 09/11/20 1040

## 2020-09-11 NOTE — ED Triage Notes (Signed)
Pt c/o dysuria x 1 week and headache x 2 days. Pt denies discharge or vaginal spotting. Pt states it burns with urination and she has some abd pain and lower back pain.

## 2020-09-11 NOTE — Discharge Instructions (Addendum)
I am treating you today for UTI.  Take antibiotic until complete.  I will also call in prescription for Diflucan for any yeast infection.  We have also checked you today for sexually transmitted infections.  You will receive follow-up if any treatment is indicated.  In regards to your left ear, add Flonase to daily regimen and increase fluids.  Follow-up for any worsening or persistent symptoms

## 2020-09-12 LAB — CERVICOVAGINAL ANCILLARY ONLY
Bacterial Vaginitis (gardnerella): NEGATIVE
Candida Glabrata: NEGATIVE
Candida Vaginitis: NEGATIVE
Chlamydia: NEGATIVE
Comment: NEGATIVE
Comment: NEGATIVE
Comment: NEGATIVE
Comment: NEGATIVE
Comment: NEGATIVE
Comment: NORMAL
Neisseria Gonorrhea: NEGATIVE
Trichomonas: NEGATIVE

## 2020-09-12 LAB — URINE CULTURE: Culture: <10000 — AB

## 2020-09-13 ENCOUNTER — Ambulatory Visit (HOSPITAL_COMMUNITY)
Admission: EM | Admit: 2020-09-13 | Discharge: 2020-09-13 | Disposition: A | Discharge status: Home / Self Care | Payer: Medicaid Other | Attending: Family Medicine | Admitting: Family Medicine

## 2020-09-13 ENCOUNTER — Encounter (HOSPITAL_COMMUNITY): Payer: Self-pay | Admitting: Emergency Medicine

## 2020-09-13 ENCOUNTER — Other Ambulatory Visit: Payer: Self-pay

## 2020-09-13 DIAGNOSIS — H6121 Impacted cerumen, right ear: Secondary | ICD-10-CM | POA: Diagnosis not present

## 2020-09-13 DIAGNOSIS — G44209 Tension-type headache, unspecified, not intractable: Secondary | ICD-10-CM

## 2020-09-13 DIAGNOSIS — H9203 Otalgia, bilateral: Secondary | ICD-10-CM

## 2020-09-13 MED ORDER — PREDNISONE 50 MG PO TABS: ORAL_TABLET | ORAL | 0 refills | Status: DC

## 2020-09-13 MED ORDER — CETIRIZINE HCL 10 MG PO TABS: 10.0000 mg | ORAL_TABLET | Freq: Every day | ORAL | 1 refills | Status: AC

## 2020-09-13 NOTE — ED Provider Notes (Signed)
[place]    CSN: [id number] Arrival date & time: 09/13/20  1113      History   Chief Complaint Chief Complaint  Patient presents with  . Otalgia  . Headache    HPI Veronica Raymond is a 34 y.o. female.   Patient presenting today with ongoing b/l ear pain, pressure and fullness and now also a diffuse headache and feeling like the right side of her neck is stiff and sore. Was treated by PCP several weeks ago for otitis media with augmentin with a short period of relief, then treated with bactrim for a UTI a few days ago. Only new thing recently is bleaching and dying her hair this week which she did state caused some irritation but she is unsure if that is related at all to her current sxs. Does have a hx of allergic rhinitis, takes flonase typically for this.      Past Medical History:  Diagnosis Date  . Allergic rhinitis   . Anxiety   . Bartholin gland cyst 05/04/2013  . GERD (gastroesophageal reflux disease)   . Migraine without aura, without mention of intractable migraine without mention of status migrainosus 07/09/2013  . Ovarian cyst 2014  . Polyarthralgia   . Sjogren's syndrome ([place])   . Vaginal Pap smear, abnormal     Patient Active Problem List   Diagnosis Date Noted  . Fibromyalgia 06/24/2020  . Sjogren's disease ([place]) 06/24/2020  . Vitamin D deficiency 08/20/2019  . Class 1 obesity due to excess calories without serious comorbidity with body mass index (BMI) of 31.0 to 31.9 in adult 08/16/2019  . Polyarthralgia 03/07/2019  . History of abnormal cervical Pap smear 01/28/2018  . Allergic rhinitis   . GERD (gastroesophageal reflux disease)   . Migraine without aura 07/09/2013    Past Surgical History:  Procedure Laterality Date  . ADENOIDECTOMY    . ESSURE TUBAL LIGATION  07-05-2014  . THERAPEUTIC ABORTION    . TONSILLECTOMY    . TYMPANOSTOMY TUBE PLACEMENT      OB History    Gravida  5   Para  4   Term  4   Preterm      AB   1   Living  4     SAB      IAB  1   Ectopic      Multiple      Live Births  4            Home Medications    Prior to Admission medications   Medication Sig Start Date End Date Taking? Authorizing Provider  fluticasone (FLONASE) 50 MCG/ACT nasal spray Place 1 spray into both nostrils daily. 08/29/20  Yes Billie Ruddy, MD  cetirizine (ZYRTEC ALLERGY) 10 MG tablet Take 1 tablet (10 mg total) by mouth daily. 09/13/20   Volney American, PA-C  Multiple Vitamin (MULTIVITAMIN) tablet Take 1 tablet by mouth daily.    [provider]  nortriptyline (PAMELOR) 10 MG capsule Take 1 capsule (10 mg total) by mouth at bedtime. 05/23/20   [place], Betty G, MD  predniSONE (DELTASONE) 50 MG tablet Take 1 tab daily for three days with breakfast 09/13/20   Volney American, PA-C  sulfamethoxazole-trimethoprim (BACTRIM DS) 800-160 MG tablet Take 1 tablet by mouth 2 (two) times daily for 3 days. 09/11/20 09/14/20  Rudolpho Sevin, NP    Family History Family History  Problem Relation Age of Onset  . Hypertension Mother   .  Sleep apnea Mother   . Obesity Mother   . Heart disease Father   . Alcohol abuse Father        dying from this  . Diabetes Father   . High blood pressure Father   . Anxiety disorder Father   . AAA (abdominal aortic aneurysm) Father   . Mental retardation Maternal Uncle   . Diabetes Maternal Grandmother   . Hypertension Maternal Grandmother   . Kidney disease Maternal Grandmother   . Heart disease Maternal Grandmother   . Stroke Maternal Grandmother   . Prostate cancer Maternal Grandfather   . Alcohol abuse Other   . Hypertension Paternal Grandmother     Social History Social History   Tobacco Use  . Smoking status: Never Smoker  . Smokeless tobacco: Never Used  Vaping Use  . Vaping Use: Never used  Substance Use Topics  . Alcohol use: Yes    Comment: rarely  . Drug use: No     Allergies   Decongestant [pseudoephedrine],  Iodinated diagnostic agents, and Percocet [oxycodone-acetaminophen]   Review of Systems Review of Systems PER HPI   Physical Exam Triage Vital Signs ED Triage Vitals  Enc Vitals Group     BP 09/13/20 1222 110/61     Pulse Rate 09/13/20 1222 68     Resp 09/13/20 1222 12     Temp 09/13/20 1222 99.3 F (37.4 C)     Temp src --      SpO2 09/13/20 1222 100 %     Weight --      Height --      Head Circumference --      Peak Flow --      Pain Score 09/13/20 1220 8     Pain Loc --      Pain Edu? --      Excl. in [place]? --    No data found.  Updated Vital Signs BP 110/61 (BP Location: Left Arm)   Pulse 68   Temp 99.3 F (37.4 C)   Resp 12   LMP 08/26/2020   SpO2 100%   Visual Acuity Right Eye Distance:   Left Eye Distance:   Bilateral Distance:    Right Eye Near:   Left Eye Near:    Bilateral Near:     Physical Exam Vitals and nursing note reviewed.  Constitutional:      Appearance: Normal appearance. She is not ill-appearing.  HENT:     Head: Atraumatic.     Ears:     Comments: B/l TM scarring, right sided cerumen impaction that was cleared fully with lavage    Nose:     Comments: Nasal turbinates boggy and erythematous    Mouth/Throat:     Mouth: Mucous membranes are moist.     Pharynx: Oropharynx is clear.  Eyes:     Extraocular Movements: Extraocular movements intact.     Conjunctiva/sclera: Conjunctivae normal.     Pupils: Pupils are equal, round, and reactive to light.  Cardiovascular:     Rate and Rhythm: Normal rate and regular rhythm.     Heart sounds: Normal heart sounds.  Pulmonary:     Effort: Pulmonary effort is normal.     Breath sounds: Normal breath sounds.  Musculoskeletal:        General: Tenderness (right SCM ttp and mild contraction) present. No swelling. Normal range of motion.     Cervical back: Normal range of motion and neck supple.  Skin:    General:  Skin is warm and dry.  Neurological:     Mental Status: She is alert and  oriented to person, place, and time.     Motor: No weakness.     Gait: Gait normal.  Psychiatric:        Mood and Affect: Mood normal.        Thought Content: Thought content normal.        Judgment: Judgment normal.      UC Treatments / Results  Labs (all labs ordered are listed, but only abnormal results are displayed) Labs Reviewed - No data to display  EKG   Radiology No results found.  Procedures Procedures (including critical care time)  Medications Ordered in UC Medications - No data to display  Initial Impression / Assessment and Plan / UC Course  I have reviewed the triage vital signs and the nursing notes.  Pertinent labs & imaging results that were available during my care of the patient were reviewed by me and considered in my medical decision making (see chart for details).     Lavage performed today with successful clearance of cerumen impaction right ear. B/l TMs scarred, suspect from chronic eustachian tube dysfunction. Recommended adding zyrtec to flonase regimen and quick prednisone burst to help with current flare and ear pain. No evidence of bacterial infection at this time. Strict PCP f/u recommended for recheck sxs.   Final Clinical Impressions(s) / UC Diagnoses   Final diagnoses:  Otalgia of both ears  Acute non intractable tension-type headache  Impacted cerumen of right ear   Discharge Instructions   None    ED Prescriptions    Medication Sig Dispense Auth. Provider   predniSONE (DELTASONE) 50 MG tablet Take 1 tab daily for three days with breakfast 3 tablet Volney American, PA-C   cetirizine (ZYRTEC ALLERGY) 10 MG tablet Take 1 tablet (10 mg total) by mouth daily. 30 tablet Volney American, [place]     PDMP not reviewed this encounter.   Volney American, [place] 09/13/20 1517

## 2020-09-13 NOTE — ED Triage Notes (Signed)
Patient c/o bilateral ear pain and headache x 3 weeks.   Patient endorses ringing w/ ears and RT sided neck stiffness.   Patient denies ear drainage or fever.   Patient was treated previously and told "the ear infection will clear on its own".   Patient hasn't used any medication's for symptoms.   Patient endorses being on antibiotic for UTI symptoms previously.

## 2020-09-18 ENCOUNTER — Telehealth (INDEPENDENT_AMBULATORY_CARE_PROVIDER_SITE_OTHER): Payer: Medicaid Other | Admitting: Family Medicine

## 2020-09-18 ENCOUNTER — Encounter (HOSPITAL_BASED_OUTPATIENT_CLINIC_OR_DEPARTMENT_OTHER): Payer: Self-pay | Admitting: *Deleted

## 2020-09-18 ENCOUNTER — Emergency Department (HOSPITAL_BASED_OUTPATIENT_CLINIC_OR_DEPARTMENT_OTHER)
Admission: EM | Admit: 2020-09-18 | Discharge: 2020-09-19 | Disposition: A | Discharge status: Home / Self Care | Payer: Medicaid Other | Attending: Emergency Medicine | Admitting: Emergency Medicine

## 2020-09-18 ENCOUNTER — Other Ambulatory Visit: Payer: Self-pay

## 2020-09-18 ENCOUNTER — Emergency Department (HOSPITAL_BASED_OUTPATIENT_CLINIC_OR_DEPARTMENT_OTHER): Payer: Medicaid Other

## 2020-09-18 DIAGNOSIS — H9202 Otalgia, left ear: Secondary | ICD-10-CM | POA: Insufficient documentation

## 2020-09-18 DIAGNOSIS — R42 Dizziness and giddiness: Secondary | ICD-10-CM | POA: Insufficient documentation

## 2020-09-18 DIAGNOSIS — R519 Headache, unspecified: Secondary | ICD-10-CM | POA: Insufficient documentation

## 2020-09-18 DIAGNOSIS — Z8669 Personal history of other diseases of the nervous system and sense organs: Secondary | ICD-10-CM | POA: Diagnosis not present

## 2020-09-18 DIAGNOSIS — E876 Hypokalemia: Secondary | ICD-10-CM | POA: Insufficient documentation

## 2020-09-18 LAB — COMPREHENSIVE METABOLIC PANEL
ALT: 14 U/L (ref 0–44)
AST: 16 U/L (ref 15–41)
Albumin: 4 g/dL (ref 3.5–5.0)
Alkaline Phosphatase: 50 U/L (ref 38–126)
Anion gap: 9 (ref 5–15)
BUN: 13 mg/dL (ref 6–20)
CO2: 26 mmol/L (ref 22–32)
Calcium: 8.9 mg/dL (ref 8.9–10.3)
Chloride: 99 mmol/L (ref 98–111)
Creatinine, Ser: 0.99 mg/dL (ref 0.44–1.00)
GFR, Estimated: >60 mL/min (ref >60)
Glucose, Bld: 106 mg/dL — ABNORMAL HIGH (ref 70–99)
Potassium: 3.3 mmol/L — ABNORMAL LOW (ref 3.5–5.1)
Sodium: 134 mmol/L — ABNORMAL LOW (ref 135–145)
Total Bilirubin: 0.5 mg/dL (ref 0.3–1.2)
Total Protein: 7.5 g/dL (ref 6.5–8.1)

## 2020-09-18 LAB — CBC WITH DIFFERENTIAL/PLATELET
Abs Immature Granulocytes: 0.03 10*3/uL (ref 0.00–0.07)
Basophils Absolute: 0 10*3/uL (ref 0.0–0.1)
Basophils Relative: 0 %
Eosinophils Absolute: 0.1 10*3/uL (ref 0.0–0.5)
Eosinophils Relative: 1 %
HCT: 39.3 % (ref 36.0–46.0)
Hemoglobin: 13.1 g/dL (ref 12.0–15.0)
Immature Granulocytes: 0 %
Lymphocytes Relative: 47 %
Lymphs Abs: 4.3 10*3/uL — ABNORMAL HIGH (ref 0.7–4.0)
MCH: 30.3 pg (ref 26.0–34.0)
MCHC: 33.3 g/dL (ref 30.0–36.0)
MCV: 91 fL (ref 80.0–100.0)
Monocytes Absolute: 0.6 10*3/uL (ref 0.1–1.0)
Monocytes Relative: 7 %
Neutro Abs: 4.2 10*3/uL (ref 1.7–7.7)
Neutrophils Relative %: 45 %
Platelets: 374 10*3/uL (ref 150–400)
RBC: 4.32 MIL/uL (ref 3.87–5.11)
RDW: 12.1 % (ref 11.5–15.5)
WBC: 9.3 10*3/uL (ref 4.0–10.5)
nRBC: 0 % (ref 0.0–0.2)

## 2020-09-18 MED ORDER — DIPHENHYDRAMINE HCL 50 MG/ML IJ SOLN
25.0000 mg | Freq: Once | INTRAMUSCULAR | Status: AC
  Administered 2020-09-18: 25 mg via INTRAVENOUS
  Filled 2020-09-18: qty 1

## 2020-09-18 MED ORDER — LACTATED RINGERS IV BOLUS
1000.0000 mL | Freq: Once | INTRAVENOUS | Status: AC
  Administered 2020-09-18: 1000 mL via INTRAVENOUS

## 2020-09-18 MED ORDER — PROCHLORPERAZINE EDISYLATE 10 MG/2ML IJ SOLN
10.0000 mg | Freq: Once | INTRAMUSCULAR | Status: AC
  Administered 2020-09-18: 10 mg via INTRAVENOUS
  Filled 2020-09-18: qty 2

## 2020-09-18 NOTE — Progress Notes (Signed)
Virtual Visit via Video Note  I connected with Veronica Raymond  on 09/18/20 at 12:20 PM EST by a video enabled telemedicine application and verified that I am speaking with the correct person using two identifiers.  Location patient: home, [place] Location provider:work or home office Persons participating in the virtual visit: patient, provider  I discussed the limitations of evaluation and management by telemedicine and the availability of in person appointments. The patient expressed understanding and agreed to proceed.   HPI:  Acute telemedicine visit for headaches: -Onset: headaches worsening for about 1 month -treated for ear infection a few weeks ago, but headaches have not resolved, has been to [facility] twice and to PCP but nothing is helping -Symptoms include: bilateral headache, throbbing and sharp, moderate pain, more severe in the mornings, sometimes one side or sometimes both side, sometimes frontal, usually temporal, had some diarrhea initially, vision seems a little off - blurry at times  -has constant headache all day, worse today -Denies:NV, denies light or sound sensitivity, weakness, numbness -Has tried: antibiotic, analgesics and her migraine medication -Pertinent past medical history: migraine with aura per prior notes and cluster headaches in the past - saw neurologist in the past - she thinks this is worse then any prior headaches, she reports did O2 treatments in the past, she is also on Pamalor   ROS: See pertinent positives and negatives per HPI.  Past Medical History:  Diagnosis Date  . Allergic rhinitis   . Anxiety   . Bartholin gland cyst 05/04/2013  . GERD (gastroesophageal reflux disease)   . Migraine without aura, without mention of intractable migraine without mention of status migrainosus 07/09/2013  . Ovarian cyst 2014  . Polyarthralgia   . Sjogren's syndrome ([place])   . Vaginal Pap smear, abnormal     Past Surgical History:  Procedure Laterality Date  .  ADENOIDECTOMY    . ESSURE TUBAL LIGATION  07-05-2014  . THERAPEUTIC ABORTION    . TONSILLECTOMY    . TYMPANOSTOMY TUBE PLACEMENT       Current Outpatient Medications:  .  cetirizine (ZYRTEC ALLERGY) 10 MG tablet, Take 1 tablet (10 mg total) by mouth daily., Disp: 30 tablet, Rfl: 1 .  fluticasone (FLONASE) 50 MCG/ACT nasal spray, Place 1 spray into both nostrils daily., Disp: 16 g, Rfl: 0 .  Multiple Vitamin (MULTIVITAMIN) tablet, Take 1 tablet by mouth daily., Disp: , Rfl:  .  nortriptyline (PAMELOR) 10 MG capsule, Take 1 capsule (10 mg total) by mouth at bedtime., Disp: 30 capsule, Rfl: 1 .  predniSONE (DELTASONE) 50 MG tablet, Take 1 tab daily for three days with breakfast, Disp: 3 tablet, Rfl: 0  EXAM:  VITALS per patient if applicable:  GENERAL: alert, oriented, appears well and in no acute distress  HEENT: atraumatic, conjunttiva clear, no obvious abnormalities on inspection of external nose and ears  NECK: normal movements of the head and neck  LUNGS: on inspection no signs of respiratory distress, breathing rate appears normal, no obvious gross SOB, gasping or wheezing  CV: no obvious cyanosis  MS: moves all visible extremities without noticeable abnormality  PSYCH/NEURO: pleasant and cooperative, no obvious depression or anxiety, speech and thought processing grossly intact  ASSESSMENT AND PLAN:  Discussed the following assessment and plan:  Severe headache  -we discussed possible serious and likely etiologies, options for evaluation and workup, limitations of telemedicine visit vs in person visit, treatment, treatment risks and precautions. This may be a worsening of her underlying headache pathology, but given  reported severe headache with visual disturbance that she reports is uncharacteristic of prior headaches and worst HA advised prompt inperson evaluation. Discussed options for inperson evaluation, she is considering going to [facility].  Advised she will need  neurology follow up once acute underlying serious etiologies ruled out to get control of headaches if this is deemed migraine or cluster - discussed various new treatments coming out for this and dietary triggers. She agrees to call her neurologist and seek inperson care if can't see neurologist promptly.   I discussed the assessment and treatment plan with the patient. The patient was provided an opportunity to ask questions and all were answered. The patient agreed with the plan and demonstrated an understanding of the instructions.     Lucretia Kern, DO

## 2020-09-18 NOTE — Patient Instructions (Signed)
Seek inperson care as we discussed either with your neurologist, primary care doctor or at the emergency room if severe or worst headache or can not get in with one of your doctors for evaluation.

## 2020-09-18 NOTE — ED Notes (Signed)
Patient transported to CT 

## 2020-09-18 NOTE — ED Triage Notes (Signed)
Headache for a month. She was seen at [facility] for same 12/18. She was told to take Tylenol. She had a video call with her MD who advised her to come to the ER for blood work.

## 2020-09-18 NOTE — ED Provider Notes (Signed)
[place] EMERGENCY DEPARTMENT Provider Note   CSN: [id number] Arrival date & time: 09/18/20  2025  K History Chief Complaint  Patient presents with  . Headache    TAURUS WILLIS is a 34 y.o. female.  The history is provided by the patient.  Headache She has history of GERD, migraines and comes in because headache for the last 2 days.  Headache has been bitemporal and she describes it as a sharp pain is constant.  It has been a severe 10/10, but she currently rates it at 7/10.  She denies any visual change, nausea, vomiting.  There has been no photophobia or phonophobia, but headache seems to be worse at night.  She has taken acetaminophen without relief.  She has been going to urgent care center for ear infection which preceded the headache.  She states that she has been put on antibiotics and 3 days later, she started having left ear pain and had a headache started.  She has noted some intermittent dizziness.  She had a virtual visit with her primary care provider who suggested she come and stay for this also blood testing.  Past Medical History:  Diagnosis Date  . Allergic rhinitis   . Anxiety   . Bartholin gland cyst 05/04/2013  . GERD (gastroesophageal reflux disease)   . Migraine without aura, without mention of intractable migraine without mention of status migrainosus 07/09/2013  . Ovarian cyst 2014  . Polyarthralgia   . Sjogren's syndrome ([place])   . Vaginal Pap smear, abnormal     Patient Active Problem List   Diagnosis Date Noted  . Fibromyalgia 06/24/2020  . Sjogren's disease ([place]) 06/24/2020  . Vitamin D deficiency 08/20/2019  . Class 1 obesity due to excess calories without serious comorbidity with body mass index (BMI) of 31.0 to 31.9 in adult 08/16/2019  . Polyarthralgia 03/07/2019  . History of abnormal cervical Pap smear 01/28/2018  . Allergic rhinitis   . GERD (gastroesophageal reflux disease)   . Migraine without aura 07/09/2013    Past  Surgical History:  Procedure Laterality Date  . ADENOIDECTOMY    . ESSURE TUBAL LIGATION  07-05-2014  . THERAPEUTIC ABORTION    . TONSILLECTOMY    . TYMPANOSTOMY TUBE PLACEMENT       OB History    Gravida  5   Para  4   Term  4   Preterm      AB  1   Living  4     SAB      IAB  1   Ectopic      Multiple      Live Births  4           Family History  Problem Relation Age of Onset  . Hypertension Mother   . Sleep apnea Mother   . Obesity Mother   . Heart disease Father   . Alcohol abuse Father        dying from this  . Diabetes Father   . High blood pressure Father   . Anxiety disorder Father   . AAA (abdominal aortic aneurysm) Father   . Mental retardation Maternal Uncle   . Diabetes Maternal Grandmother   . Hypertension Maternal Grandmother   . Kidney disease Maternal Grandmother   . Heart disease Maternal Grandmother   . Stroke Maternal Grandmother   . Prostate cancer Maternal Grandfather   . Alcohol abuse Other   . Hypertension Paternal Grandmother     Social  History   Tobacco Use  . Smoking status: Never Smoker  . Smokeless tobacco: Never Used  Vaping Use  . Vaping Use: Never used  Substance Use Topics  . Alcohol use: Yes    Comment: rarely  . Drug use: No    Home Medications Prior to Admission medications   Medication Sig Start Date End Date Taking? Authorizing Provider  cetirizine (ZYRTEC ALLERGY) 10 MG tablet Take 1 tablet (10 mg total) by mouth daily. 09/13/20  Yes Particia Nearing, PA-C  fluticasone [facility]) 50 MCG/ACT nasal spray Place 1 spray into both nostrils daily. 08/29/20  Yes Deeann Saint, MD  Multiple Vitamin (MULTIVITAMIN) tablet Take 1 tablet by mouth daily.   Yes [provider]  nortriptyline (PAMELOR) 10 MG capsule Take 1 capsule (10 mg total) by mouth at bedtime. 05/23/20  Yes [place], Betty G, MD  predniSONE (DELTASONE) 50 MG tablet Take 1 tab daily for three days with breakfast 09/13/20   Particia Nearing, PA-C    Allergies    Decongestant [pseudoephedrine], Iodinated diagnostic agents, and Percocet [oxycodone-acetaminophen]  Review of Systems   Review of Systems  Neurological: Positive for headaches.  All other systems reviewed and are negative.   Physical Exam Updated Vital Signs BP 109/69 (BP Location: Right Arm)   Pulse 79   Temp 98.4 F (36.9 C) (Oral)   Resp 18   Ht 5\' 4"  (1.626 m)   Wt 85.1 kg   LMP 08/26/2020   SpO2 100%   BMI 32.20 kg/m   Physical Exam Vitals and nursing note reviewed.   34 year old female, resting comfortably and in no acute distress. Vital signs are normal. Oxygen saturation is 100%, which is normal. Head is normocephalic and atraumatic. PERRLA, EOMI. Oropharynx is clear.  Left tympanic membrane is scarred and retracted, right tabetic membrane is normal.  Fundi show no hemorrhage, exudate, papilledema.  There is tenderness palpation of the temporalis muscles bilaterally. Neck is nontender and supple without adenopathy or JVD. Back is nontender and there is no CVA tenderness. Lungs are clear without rales, wheezes, or rhonchi. Chest is nontender. Heart has regular rate and rhythm without murmur. Abdomen is soft, flat, nontender without masses or hepatosplenomegaly and peristalsis is normoactive. Extremities have no cyanosis or edema, full range of motion is present. Skin is warm and dry without rash. Neurologic: Mental status is normal, cranial nerves are intact, there are no motor or sensory deficits.  Strength is 5/5 in both arms and both legs.  ED Results / Procedures / Treatments   Labs (all labs ordered are listed, but only abnormal results are displayed) Labs Reviewed  CBC WITH DIFFERENTIAL/PLATELET - Abnormal; Notable for the following components:      Result Value   Lymphs Abs 4.3 (*)    All other components within normal limits  COMPREHENSIVE METABOLIC PANEL - Abnormal; Notable for the following components:   Sodium  134 (*)    Potassium 3.3 (*)    Glucose, Bld 106 (*)    All other components within normal limits  URINALYSIS, ROUTINE W REFLEX MICROSCOPIC  PREGNANCY, URINE   Radiology CT Head Wo Contrast  Result Date: 09/18/2020 CLINICAL DATA:  Headache for 1 month. EXAM: CT HEAD WITHOUT CONTRAST TECHNIQUE: Contiguous axial images were obtained from the base of the skull through the vertex without intravenous contrast. COMPARISON:  Head CT 08/12/2010 and MRI 04/28/2019 FINDINGS: Brain: There is no evidence of an acute infarct, intracranial hemorrhage, mass, midline shift, or  extra-axial fluid collection. The ventricles and sulci are normal. Vascular: No hyperdense vessel. Skull: No fracture or suspicious osseous lesion. Sinuses/Orbits: Minimal fluid in the right sphenoid sinus. Clear mastoid air cells. Unremarkable visualized orbits. Other: None. IMPRESSION: Negative head CT. Electronically Signed   By: Logan Bores M.D.   On: 09/18/2020 23:53   Procedures Procedures  Medications Ordered in ED Medications  dexamethasone (DECADRON) injection 10 mg (has no administration in time range)  lactated ringers bolus 1,000 mL (0 mLs Intravenous Stopped 09/19/20 0022)  prochlorperazine (COMPAZINE) injection 10 mg (10 mg Intravenous Given 09/18/20 2356)  diphenhydrAMINE (BENADRYL) injection 25 mg (25 mg Intravenous Given 09/18/20 2356)    ED Course  I have reviewed the triage vital signs and the nursing notes.  Pertinent labs & imaging results that were available during my care of the patient were reviewed by me and considered in my medical decision making (see chart for details).  MDM Rules/Calculators/A&P Headache of uncertain cause.  Tenderness of the temporalis muscles suggestive of muscle contraction headache.  No red flags to suggest more serious causes of headache.  However, but given ongoing headache and in usual pattern, will get CT of head.  We will also give trial of a headache cocktail of lactated  Ringer's, prochlorperazine, diphenhydramine.  Patient states that she had problems with nausea with prior headache cocktails.  Review of old records shows that the prior headache cocktail was with metoclopramide.  Labs are obtained and showed mild hypokalemia and borderline hyponatremia, not felt to be clinically significant.  CT shows no acute process.  She had excellent relief of headache with above-noted treatment, but is complaining of nausea and jitteriness.  We will give dexamethasone to try to help prevent rebound headache, and also give a short course of prednisone.  Patient states that she has an appointment with a neurologist in about 5 weeks, she is to keep that appointment.  Final Clinical Impression(s) / ED Diagnoses Final diagnoses:  Bad headache  Hypokalemia    Rx / DC Orders ED Discharge Orders         Ordered    predniSONE (DELTASONE) 50 MG tablet        09/19/20 3300           Delora Fuel, MD 76/22/63 0109

## 2020-09-18 NOTE — ED Notes (Signed)
Pt denies need to urinate states she had recent specimen results from urgent care x2 days ago.

## 2020-09-19 MED ORDER — PREDNISONE 50 MG PO TABS: ORAL_TABLET | ORAL | 0 refills | Status: DC

## 2020-09-19 MED ORDER — DEXAMETHASONE SODIUM PHOSPHATE 10 MG/ML IJ SOLN
10.0000 mg | Freq: Once | INTRAMUSCULAR | Status: DC
  Filled 2020-09-19: qty 1

## 2020-09-19 NOTE — ED Notes (Signed)
Gave pt saltines/ginger ale c/o nausea. IV removed, refused decadron, left ED w/o incident.

## 2020-09-19 NOTE — Discharge Instructions (Addendum)
Keep your appointment with the neurologist.    Return if you have any new or concerning symptoms.

## 2020-09-29 ENCOUNTER — Other Ambulatory Visit: Payer: Self-pay

## 2020-09-29 ENCOUNTER — Other Ambulatory Visit: Payer: Self-pay | Admitting: Family Medicine

## 2020-09-29 DIAGNOSIS — R0982 Postnasal drip: Secondary | ICD-10-CM

## 2020-09-29 MED ORDER — FLUTICASONE PROPIONATE 50 MCG/ACT NA SUSP: 1.0000 | Freq: Every day | NASAL | 0 refills | Status: DC

## 2020-10-17 ENCOUNTER — Ambulatory Visit: Payer: Medicaid Other | Admitting: Neurology

## 2020-11-17 ENCOUNTER — Other Ambulatory Visit: Payer: Self-pay | Admitting: Family Medicine

## 2020-11-17 DIAGNOSIS — R0982 Postnasal drip: Secondary | ICD-10-CM

## 2020-12-18 ENCOUNTER — Other Ambulatory Visit: Payer: Self-pay

## 2020-12-18 ENCOUNTER — Ambulatory Visit (HOSPITAL_COMMUNITY)
Admission: EM | Admit: 2020-12-18 | Discharge: 2020-12-18 | Disposition: A | Discharge status: Home / Self Care | Payer: Medicaid Other | Attending: Emergency Medicine | Admitting: Emergency Medicine

## 2020-12-18 ENCOUNTER — Encounter (HOSPITAL_COMMUNITY): Payer: Self-pay

## 2020-12-18 DIAGNOSIS — H9202 Otalgia, left ear: Secondary | ICD-10-CM

## 2020-12-18 DIAGNOSIS — H6690 Otitis media, unspecified, unspecified ear: Secondary | ICD-10-CM

## 2020-12-18 DIAGNOSIS — H6692 Otitis media, unspecified, left ear: Secondary | ICD-10-CM

## 2020-12-18 MED ORDER — AMOXICILLIN 500 MG PO CAPS: 500.0000 mg | ORAL_CAPSULE | Freq: Two times a day (BID) | ORAL | 0 refills | Status: AC

## 2020-12-18 NOTE — ED Triage Notes (Signed)
Pt reports pain and fullness sensation in left ear x 2 days. Denies fever, chills, drainage.

## 2020-12-18 NOTE — ED Provider Notes (Signed)
[facility]    CSN: [id number] Arrival date & time: 12/18/20  1146      History   Chief Complaint Chief Complaint  Patient presents with  . Otalgia    HPI Veronica Raymond is a 35 y.o. female.   Veronica Raymond is a 35 y.o. here with chief complaint of left ear pain that has been going on for the past 2-3 days.  History of eustachian tube dysfunction.  Reports ear pain as aching and throbbing.  Symptoms are constant with no defined alleviating or aggravating factors.  Has tried OTC medication with no relief.   Denies any fevers, chest pain, shortness of breath, N/V/D, abdominal pain, or headaches.    ROS: As per HPI, all other pertinent ROS negative   The history is provided by the patient.  Otalgia   Past Medical History:  Diagnosis Date  . Allergic rhinitis   . Anxiety   . Bartholin gland cyst 05/04/2013  . GERD (gastroesophageal reflux disease)   . Migraine without aura, without mention of intractable migraine without mention of status migrainosus 07/09/2013  . Ovarian cyst 2014  . Polyarthralgia   . Sjogren's syndrome ([place])   . Vaginal Pap smear, abnormal     Patient Active Problem List   Diagnosis Date Noted  . Fibromyalgia 06/24/2020  . Sjogren's disease ([place]) 06/24/2020  . Vitamin D deficiency 08/20/2019  . Class 1 obesity due to excess calories without serious comorbidity with body mass index (BMI) of 31.0 to 31.9 in adult 08/16/2019  . Polyarthralgia 03/07/2019  . History of abnormal cervical Pap smear 01/28/2018  . Allergic rhinitis   . GERD (gastroesophageal reflux disease)   . Migraine without aura 07/09/2013    Past Surgical History:  Procedure Laterality Date  . ADENOIDECTOMY    . ESSURE TUBAL LIGATION  07-05-2014  . THERAPEUTIC ABORTION    . TONSILLECTOMY    . TYMPANOSTOMY TUBE PLACEMENT      OB History    Gravida  5   Para  4   Term  4   Preterm      AB  1   Living  4     SAB      IAB  1   Ectopic       Multiple      Live Births  4            Home Medications    Prior to Admission medications   Medication Sig Start Date End Date Taking? Authorizing Provider  amoxicillin (AMOXIL) 500 MG capsule Take 1 capsule (500 mg total) by mouth 2 (two) times daily for 7 days. 12/18/20 12/25/20 Yes Pearson Forster, NP  cetirizine (ZYRTEC ALLERGY) 10 MG tablet Take 1 tablet (10 mg total) by mouth daily. 09/13/20   Volney American, PA-C  fluticasone [facility]) 50 MCG/ACT nasal spray SHAKE LIQUID AND USE 1 SPRAY IN [facility] NOSTRIL DAILY 11/17/20   Martinique, Betty G, MD  Multiple Vitamin (MULTIVITAMIN) tablet Take 1 tablet by mouth daily.    [provider]  nortriptyline (PAMELOR) 10 MG capsule Take 1 capsule (10 mg total) by mouth at bedtime. 05/23/20   Martinique, Betty G, MD  predniSONE (DELTASONE) 50 MG tablet Take 1 tab daily for three days with breakfast 26/37/85   Delora Fuel, MD    Family History Family History  Problem Relation Age of Onset  . Hypertension Mother   . Sleep apnea Mother   . Obesity Mother   .  Heart disease Father   . Alcohol abuse Father        dying from this  . Diabetes Father   . High blood pressure Father   . Anxiety disorder Father   . AAA (abdominal aortic aneurysm) Father   . Mental retardation Maternal Uncle   . Diabetes Maternal Grandmother   . Hypertension Maternal Grandmother   . Kidney disease Maternal Grandmother   . Heart disease Maternal Grandmother   . Stroke Maternal Grandmother   . Prostate cancer Maternal Grandfather   . Alcohol abuse Other   . Hypertension Paternal Grandmother     Social History Social History   Tobacco Use  . Smoking status: Never Smoker  . Smokeless tobacco: Never Used  Vaping Use  . Vaping Use: Never used  Substance Use Topics  . Alcohol use: Yes    Comment: rarely  . Drug use: No     Allergies   Decongestant [pseudoephedrine], Iodinated diagnostic agents, Percocet [oxycodone-acetaminophen], Iohexol,  Oxycodone-acetaminophen, and Sulfa antibiotics   Review of Systems Review of Systems  HENT: Positive for ear pain.      Physical Exam Triage Vital Signs ED Triage Vitals  Enc Vitals Group     BP 12/18/20 1244 (!) 113/57     Pulse Rate 12/18/20 1244 85     Resp 12/18/20 1244 18     Temp 12/18/20 1244 98.4 F (36.9 C)     Temp Source 12/18/20 1244 Oral     SpO2 12/18/20 1244 98 %     Weight --      Height --      Head Circumference --      Peak Flow --      Pain Score 12/18/20 1242 4     Pain Loc --      Pain Edu? --      Excl. in [place]? --    No data found.  Updated Vital Signs BP (!) 113/57 (BP Location: Right Arm)   Pulse 85   Temp 98.4 F (36.9 C) (Oral)   Resp 18   LMP  (Within Weeks) Comment: 2weeks  SpO2 98%   Visual Acuity Right Eye Distance:   Left Eye Distance:   Bilateral Distance:    Right Eye Near:   Left Eye Near:    Bilateral Near:     Physical Exam Vitals and nursing note reviewed.  Constitutional:      General: She is not in acute distress.    Appearance: Normal appearance. She is not ill-appearing, toxic-appearing or diaphoretic.  HENT:     Head: Normocephalic and atraumatic.     Right Ear: Hearing and external ear normal. Tympanic membrane is scarred.     Left Ear: Hearing and external ear normal. Tympanic membrane is scarred, erythematous and bulging.  Eyes:     Conjunctiva/sclera: Conjunctivae normal.  Cardiovascular:     Rate and Rhythm: Normal rate.     Pulses: Normal pulses.  Pulmonary:     Effort: Pulmonary effort is normal.  Abdominal:     General: Abdomen is flat.  Musculoskeletal:        General: Normal range of motion.     Cervical back: Normal range of motion.  Skin:    General: Skin is warm and dry.  Neurological:     General: No focal deficit present.     Mental Status: She is alert and oriented to person, place, and time.  Psychiatric:        Mood  and Affect: Mood normal.      UC Treatments / Results   Labs (all labs ordered are listed, but only abnormal results are displayed) Labs Reviewed - No data to display  EKG   Radiology No results found.  Procedures Procedures (including critical care time)  Medications Ordered in UC Medications - No data to display  Initial Impression / Assessment and Plan / UC Course  I have reviewed the triage vital signs and the nursing notes.  Pertinent labs & imaging results that were available during my care of the patient were reviewed by me and considered in my medical decision making (see chart for details).     Otitis media  Assessment negative for red flags or concerns.  Take amoxicillin twice a day for 7 days.  Continue other medications.   Tylenol and/or ibuprofen as needed Follow up with PCP.  Final Clinical Impressions(s) / UC Diagnoses   Final diagnoses:  Acute otitis media, unspecified otitis media type  Otalgia of left ear     Discharge Instructions     Take the Amoxicillin twice a day for 7 days.   Continue to take Flonase and Zyrtec.  You may take ibuprofen and/or tylenol as needed for pain relief and fever reduction.    Follow up with your PCP or go to the Emergency Department if symptoms worsen or do not improve in the next few days.      ED Prescriptions    Medication Sig Dispense Auth. Provider   amoxicillin (AMOXIL) 500 MG capsule Take 1 capsule (500 mg total) by mouth 2 (two) times daily for 7 days. 14 capsule Pearson Forster, NP     PDMP not reviewed this encounter.   Pearson Forster, NP 12/18/20 [phone]

## 2020-12-18 NOTE — Discharge Instructions (Addendum)
Take the Amoxicillin twice a day for 7 days.   Continue to take Flonase and Zyrtec.  You may take ibuprofen and/or tylenol as needed for pain relief and fever reduction.    Follow up with your PCP or go to the Emergency Department if symptoms worsen or do not improve in the next few days.

## 2021-04-14 ENCOUNTER — Encounter: Payer: Self-pay | Admitting: Family Medicine

## 2021-04-15 ENCOUNTER — Encounter: Payer: Self-pay | Admitting: Family Medicine

## 2021-04-15 ENCOUNTER — Ambulatory Visit (INDEPENDENT_AMBULATORY_CARE_PROVIDER_SITE_OTHER): Payer: Medicaid Other | Admitting: Family Medicine

## 2021-04-15 VITALS — BP 130/70 | HR 85 | Resp 16 | Ht 64.0 in | Wt 187.0 lb

## 2021-04-15 DIAGNOSIS — B001 Herpesviral vesicular dermatitis: Secondary | ICD-10-CM | POA: Diagnosis not present

## 2021-04-15 MED ORDER — ACYCLOVIR 400 MG PO TABS: 400.0000 mg | ORAL_TABLET | Freq: Every day | ORAL | 1 refills | Status: DC

## 2021-04-15 NOTE — Telephone Encounter (Signed)
Acyclovir is not even on her medication list, so I need to see her. Thanks, BJ

## 2021-04-15 NOTE — Telephone Encounter (Signed)
Patient has an appointment today to see PCP.

## 2021-04-15 NOTE — Progress Notes (Signed)
Chief Complaint  Patient presents with   Medication Refill   HPI: Veronica Raymond is a 35 y.o. female with history of vitamin D deficiency, Sjogren's disease, GERD, and fibromyalgia here today complaining of tender lesion on upper lip.  She had mildly tingling before lesion appeared. She noted problem yesterday. She has similar problem about 4 years ago, former PCP prescribed acyclovir. She thinks problem is exacerbated by stress. Negative for sore throat, oral lesions, or swelling glands. She has used OTC Abreva. Problem is stable.  Review of Systems  Constitutional:  Negative for activity change, appetite change, chills and fever.  HENT:  Negative for trouble swallowing.   Gastrointestinal:  Negative for nausea and vomiting.  Skin:  Negative for pallor and rash.  Neurological:  Negative for syncope and weakness.  Rest see pertinent positives and negatives per HPI.  Current Outpatient Medications on File Prior to Visit  Medication Sig Dispense Refill   cetirizine (ZYRTEC ALLERGY) 10 MG tablet Take 1 tablet (10 mg total) by mouth daily. 30 tablet 1   fluticasone (FLONASE) 50 MCG/ACT nasal spray SHAKE LIQUID AND USE 1 SPRAY IN EACH NOSTRIL DAILY 16 g 0   Multiple Vitamin (MULTIVITAMIN) tablet Take 1 tablet by mouth daily.     nortriptyline (PAMELOR) 10 MG capsule Take 1 capsule (10 mg total) by mouth at bedtime. 30 capsule 1   No current facility-administered medications on file prior to visit.   Past Medical History:  Diagnosis Date   Allergic rhinitis    Anxiety    Bartholin gland cyst 05/04/2013   GERD (gastroesophageal reflux disease)    Migraine without aura, without mention of intractable migraine without mention of status migrainosus 07/09/2013   Ovarian cyst 2014   Polyarthralgia    Sjogren's syndrome ([place])    Vaginal Pap smear, abnormal    Allergies  Allergen Reactions   Decongestant [Pseudoephedrine] Other (See Comments)    SI    Iodinated Diagnostic  Agents Hives   Percocet [Oxycodone-Acetaminophen] Itching   Iohexol Hives and Itching   Oxycodone-Acetaminophen Other (See Comments)   Sulfa Antibiotics Hives and Itching    Social History   Socioeconomic History   Marital status: Single    Spouse name: Not on file   Number of children: 3   Years of education: college   Highest education level: Not on file  Occupational History   Occupation: CMA    Employer: [employer]    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[employer]  Tobacco Use   Smoking status: Never   Smokeless tobacco: Never  Vaping Use   Vaping Use: Never used  Substance and Sexual Activity   Alcohol use: Yes    Comment: rarely   Drug use: No   Sexual activity: Not on file  Other Topics Concern   Not on file  Social History Narrative   Lives with her 4 children      Lives in two story home      Right handed      Highest level of edu- [employer]      Social Determinants of Health   Financial Resource Strain: Not on file  Food Insecurity: Not on file  Transportation Needs: Not on file  Physical Activity: Not on file  Stress: Not on file  Social Connections: Not on file   Vitals:   04/15/21 1156  BP: 130/70  Pulse: 85  Resp: 16  SpO2: 99%   Body mass index is 32.1 kg/m.  Physical Exam Constitutional:  General: She is not in acute distress.    Appearance: She is well-developed.  HENT:     Head: Atraumatic.     Mouth/Throat:   Eyes:     Conjunctiva/sclera: Conjunctivae normal.  Pulmonary:     Effort: Pulmonary effort is normal. No respiratory distress.     Breath sounds: Normal breath sounds.  Lymphadenopathy:     Cervical: No cervical adenopathy.  Skin:    General: Skin is warm.     Findings: Lesion present. No rash.  Neurological:     Mental Status: She is alert and oriented to person, place, and time.  Psychiatric:        Speech: Speech normal.     Comments: Well groomed, good eye contact.   ASSESSMENT AND PLAN:  Veronica Raymond was seen today  for medication refill.  Diagnoses and all orders for this visit:  Herpes labialis without complication -     acyclovir (ZOVIRAX) 400 MG tablet; Take 1 tablet (400 mg total) by mouth 5 (five) times daily.  2nd episode within the past 4 years. Acyclovir helped in the past, so start treatment as soon as possible, 5 times daily x 5 days. Monitor for new symptoms. Follow-up as needed.  Return if symptoms worsen or fail to improve.  Janeisha Ryle G. Martinique, MD  [facility]. [place] office.

## 2021-04-16 ENCOUNTER — Other Ambulatory Visit: Payer: Self-pay | Admitting: Adult Health

## 2021-04-16 MED ORDER — VALACYCLOVIR HCL 1 G PO TABS: 1000.0000 mg | ORAL_TABLET | Freq: Two times a day (BID) | ORAL | 0 refills | Status: AC

## 2021-05-22 ENCOUNTER — Encounter: Payer: Self-pay | Admitting: Family Medicine

## 2021-05-22 ENCOUNTER — Ambulatory Visit (INDEPENDENT_AMBULATORY_CARE_PROVIDER_SITE_OTHER): Payer: Medicaid Other | Admitting: Family Medicine

## 2021-05-22 ENCOUNTER — Other Ambulatory Visit: Payer: Self-pay

## 2021-05-22 VITALS — BP 122/70 | HR 86 | Temp 98.6°F | Resp 16 | Ht 64.0 in | Wt 187.0 lb

## 2021-05-22 DIAGNOSIS — H6502 Acute serous otitis media, left ear: Secondary | ICD-10-CM | POA: Diagnosis not present

## 2021-05-22 DIAGNOSIS — H938X2 Other specified disorders of left ear: Secondary | ICD-10-CM | POA: Diagnosis not present

## 2021-05-22 DIAGNOSIS — R519 Headache, unspecified: Secondary | ICD-10-CM | POA: Diagnosis not present

## 2021-05-22 MED ORDER — AMOXICILLIN-POT CLAVULANATE 875-125 MG PO TABS: 1.0000 | ORAL_TABLET | Freq: Two times a day (BID) | ORAL | 0 refills | Status: AC

## 2021-05-22 NOTE — Patient Instructions (Signed)
A few things to remember from today's visit:  Sensation of fullness in left ear  Left chronic serous otitis media - Plan: amoxicillin-clavulanate (AUGMENTIN) 875-125 MG tablet  If you need refills please call your pharmacy. Do not use My Chart to request refills or for acute issues that need immediate attention.   Tympanic membrane slightly erythematous and bulge, you can hold on antibiotic for 48 hours and start if problem is not improved or gets worse.  Please be sure medication list is accurate. If a new problem present, please set up appointment sooner than planned today.

## 2021-05-22 NOTE — Progress Notes (Signed)
Chief Complaint  Patient presents with   Ear Fullness   HPI: [name] is a 35 y.o. female with history of vitamin D deficiency, Sjogren's disease, polyarthralgia, GERD, fibromyalgia, and allergy rhinitis here today complaining of 2-3 days of left ear discomfort, fullness sensation. She has not identified exacerbating or alleviating factors.  She has had similar problem in the past and caused by OM, which usually presents with headache and fullness sensation.  Hx of ear/TM surgeries.  Ear Fullness  There is pain in the left ear. The current episode started in the past 7 days. The problem occurs constantly. The problem has been gradually worsening. There has been no fever. Associated symptoms include headaches. Pertinent negatives include no abdominal pain, coughing, diarrhea, ear discharge, hearing loss, neck pain, rash, rhinorrhea, sore throat or vomiting. She has tried nothing for the symptoms.  Negative for fever, change in appetite, sore throat, earache, or ear drainage. Negative for recent URI or travel.  Allergy rhinitis on Flonase nasal spray and Zyrtec 10 mg daily.  Left frontal throbbing headache. No associated visual changes, photophobia, nausea, vomiting, or focal neurologic deficit. Head CT done in 08/2020 in the ER because of headache was negative. She has not tried OTC analgesics.  Review of Systems  Constitutional:  Positive for fatigue. Negative for activity change and appetite change.  HENT:  Negative for ear discharge, hearing loss, rhinorrhea and sore throat.   Respiratory:  Negative for cough, shortness of breath and wheezing.   Gastrointestinal:  Negative for abdominal pain, diarrhea and vomiting.  Musculoskeletal:  Negative for gait problem and neck pain.  Skin:  Negative for rash.  Allergic/Immunologic: Positive for environmental allergies.  Neurological:  Positive for headaches. Negative for syncope and weakness.  Rest see pertinent positives  and negatives per HPI.  Current Outpatient Medications on File Prior to Visit  Medication Sig Dispense Refill   cetirizine (ZYRTEC ALLERGY) 10 MG tablet Take 1 tablet (10 mg total) by mouth daily. 30 tablet 1   fluticasone (FLONASE) 50 MCG/ACT nasal spray SHAKE LIQUID AND USE 1 SPRAY IN EACH NOSTRIL DAILY 16 g 0   Multiple Vitamin (MULTIVITAMIN) tablet Take 1 tablet by mouth daily.     nortriptyline (PAMELOR) 10 MG capsule Take 1 capsule (10 mg total) by mouth at bedtime. 30 capsule 1   No current facility-administered medications on file prior to visit.   Past Medical History:  Diagnosis Date   Allergic rhinitis    Anxiety    Bartholin gland cyst 05/04/2013   GERD (gastroesophageal reflux disease)    Migraine without aura, without mention of intractable migraine without mention of status migrainosus 07/09/2013   Ovarian cyst 2014   Polyarthralgia    Sjogren's syndrome ([place])    Vaginal Pap smear, abnormal    Allergies  Allergen Reactions   Decongestant [Pseudoephedrine] Other (See Comments)    SI    Iodinated Diagnostic Agents Hives   Percocet [Oxycodone-Acetaminophen] Itching   Iohexol Hives and Itching   Oxycodone-Acetaminophen Other (See Comments)   Sulfa Antibiotics Hives and Itching   Social History   Socioeconomic History   Marital status: Single    Spouse name: Not on file   Number of children: 3   Years of education: college   Highest education level: Not on file  Occupational History   Occupation: CMA    Employer: [employer]    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[employer]  Tobacco Use   Smoking status: Never   Smokeless tobacco:  Never  Vaping Use   Vaping Use: Never used  Substance and Sexual Activity   Alcohol use: Yes    Comment: rarely   Drug use: No   Sexual activity: Not on file  Other Topics Concern   Not on file  Social History Narrative   Lives with her 4 children      Lives in two story home      Right handed      Highest level of edu- [employer]       Social Determinants of Health   Financial Resource Strain: Not on file  Food Insecurity: Not on file  Transportation Needs: Not on file  Physical Activity: Not on file  Stress: Not on file  Social Connections: Not on file   Vitals:   05/22/21 1556  BP: 122/70  Pulse: 86  Resp: 16  Temp: 98.6 F (37 C)  SpO2: 99%   Body mass index is 32.1 kg/m.  Physical Exam Vitals and nursing note reviewed.  Constitutional:      General: She is not in acute distress.    Appearance: She is well-developed. She is not ill-appearing.  HENT:     Head: Normocephalic and atraumatic.     Right Ear: Ear canal and external ear normal. Tympanic membrane is scarred.     Left Ear: Ear canal and external ear normal. A middle ear effusion is present. Tympanic membrane is scarred, erythematous (slightly.) and bulging.     Ears:      Comments: Gross hearing otherwise intact.    Nose: No rhinorrhea.     Right Sinus: No frontal sinus tenderness.     Left Sinus: No frontal sinus tenderness.  Eyes:     Conjunctiva/sclera: Conjunctivae normal.  Cardiovascular:     Rate and Rhythm: Normal rate and regular rhythm.     Heart sounds: No murmur heard. Pulmonary:     Effort: Pulmonary effort is normal. No respiratory distress.     Breath sounds: Normal breath sounds. No stridor.  Lymphadenopathy:     Head:     Left side of head: No preauricular or posterior auricular adenopathy.     Cervical: No cervical adenopathy.  Skin:    General: Skin is warm.     Findings: No erythema or rash.  Neurological:     General: No focal deficit present.     Mental Status: She is alert and oriented to person, place, and time.  Psychiatric:        Speech: Speech normal.     Comments: Well groomed, good eye contact.   ASSESSMENT AND PLAN:  Veronica Raymond was seen today for ear fullness.  Diagnoses and all orders for this visit:  Sensation of fullness in left ear We discussed possible etiologies. ?Eustachian tube  dysfunction,auto inflation maneuvers had worsened problem in the past and has had side effects with decongestants in the past. Continue Flonase nasal spray. Monitor for new symptoms.  Acute serous otitis media of left ear, recurrence not specified I do not think she needs antibiotic treatment at this time, recommend holding on Augmentin for 72 hours and is started if problem gets worse or if it is not any better. Instructed about warning signs.  -     amoxicillin-clavulanate (AUGMENTIN) 875-125 MG tablet; Take 1 tablet by mouth 2 (two) times daily for 7 days.  Frontal headache Reported similar problem in the past when she has been diagnosed with OM. Monitor for new symptoms. ?  Tension headache. Instructed about  warning signs.  Return if symptoms worsen or fail to improve.  Sahith Nurse G. Martinique, MD  [facility]. [place] office.

## 2021-05-29 ENCOUNTER — Encounter: Payer: Self-pay | Admitting: Adult Health

## 2021-05-29 ENCOUNTER — Other Ambulatory Visit: Payer: Self-pay

## 2021-05-29 ENCOUNTER — Ambulatory Visit (INDEPENDENT_AMBULATORY_CARE_PROVIDER_SITE_OTHER): Payer: Medicaid Other | Admitting: Adult Health

## 2021-05-29 VITALS — BP 120/82 | HR 75 | Temp 98.6°F

## 2021-05-29 DIAGNOSIS — R519 Headache, unspecified: Secondary | ICD-10-CM | POA: Diagnosis not present

## 2021-05-29 NOTE — Progress Notes (Signed)
Subjective:    Patient ID: Veronica Raymond, female    DOB: 31-Mar-1986, 35 y.o.   MRN: [id number]  HPI 35 year old female who  has a past medical history of Allergic rhinitis, Anxiety, Bartholin gland cyst (05/04/2013), GERD (gastroesophageal reflux disease), Migraine without aura, without mention of intractable migraine without mention of status migrainosus (07/09/2013), Ovarian cyst (2014), Polyarthralgia, Sjogren's syndrome ([place]), and Vaginal Pap smear, abnormal.  She is being evaluated today for headaches. She has a history of migraine and cluster headaches. Has been prescribed Nortriptyline 10 mg in the past but stopped taking this about 8 months ago do to drowsiness in the morning.   Headaches started yesterday and lasted to some degree for the entire day .Was feeling better this morning and started to feel the pain behind the right eye. Pain is felt as a poking sensation. + blurred vision in right eye. + photophobia but no phonophobia or eye drainage.   She has responded well to oxygen therapy in the past.   Review of Systems See HPI   Past Medical History:  Diagnosis Date   Allergic rhinitis    Anxiety    Bartholin gland cyst 05/04/2013   GERD (gastroesophageal reflux disease)    Migraine without aura, without mention of intractable migraine without mention of status migrainosus 07/09/2013   Ovarian cyst 2014   Polyarthralgia    Sjogren's syndrome ([place])    Vaginal Pap smear, abnormal     Social History   Socioeconomic History   Marital status: Single    Spouse name: Not on file   Number of children: 3   Years of education: college   Highest education level: Not on file  Occupational History   Occupation: CMA    Employer: [employer]    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[employer]  Tobacco Use   Smoking status: Never   Smokeless tobacco: Never  Vaping Use   Vaping Use: Never used  Substance and Sexual Activity   Alcohol use: Yes    Comment: rarely   Drug use: No   Sexual  activity: Not on file  Other Topics Concern   Not on file  Social History Narrative   Lives with her 4 children      Lives in two story home      Right handed      Highest level of edu- [employer]      Social Determinants of Health   Financial Resource Strain: Not on file  Food Insecurity: Not on file  Transportation Needs: Not on file  Physical Activity: Not on file  Stress: Not on file  Social Connections: Not on file  Intimate Partner Violence: Not on file    Past Surgical History:  Procedure Laterality Date   ADENOIDECTOMY     ESSURE TUBAL LIGATION  07-05-2014   THERAPEUTIC ABORTION     TONSILLECTOMY     TYMPANOSTOMY TUBE PLACEMENT      Family History  Problem Relation Age of Onset   Hypertension Mother    Sleep apnea Mother    Obesity Mother    Heart disease Father    Alcohol abuse Father        dying from this   Diabetes Father    High blood pressure Father    Anxiety disorder Father    AAA (abdominal aortic aneurysm) Father    Mental retardation Maternal Uncle    Diabetes Maternal Grandmother    Hypertension Maternal Grandmother    Kidney disease Maternal  Grandmother    Heart disease Maternal Grandmother    Stroke Maternal Grandmother    Prostate cancer Maternal Grandfather    Alcohol abuse Other    Hypertension Paternal Grandmother     Allergies  Allergen Reactions   Decongestant [Pseudoephedrine] Other (See Comments)    SI    Iodinated Diagnostic Agents Hives   Percocet [Oxycodone-Acetaminophen] Itching   Iohexol Hives and Itching   Oxycodone-Acetaminophen Other (See Comments)   Sulfa Antibiotics Hives and Itching    Current Outpatient Medications on File Prior to Visit  Medication Sig Dispense Refill   amoxicillin-clavulanate (AUGMENTIN) 875-125 MG tablet Take 1 tablet by mouth 2 (two) times daily for 7 days. 14 tablet 0   cetirizine (ZYRTEC ALLERGY) 10 MG tablet Take 1 tablet (10 mg total) by mouth daily. 30 tablet 1   fluticasone  (FLONASE) 50 MCG/ACT nasal spray SHAKE LIQUID AND USE 1 SPRAY IN EACH NOSTRIL DAILY 16 g 0   Multiple Vitamin (MULTIVITAMIN) tablet Take 1 tablet by mouth daily.     nortriptyline (PAMELOR) 10 MG capsule Take 1 capsule (10 mg total) by mouth at bedtime. 30 capsule 1   No current facility-administered medications on file prior to visit.    BP 120/82   Pulse 75   Temp 98.6 F (37 C)   LMP 05/01/2021   SpO2 100%       Objective:   Physical Exam Vitals and nursing note reviewed.  Constitutional:      Appearance: Normal appearance.  Eyes:     General:        Right eye: No discharge.        Left eye: No discharge.     Extraocular Movements: Extraocular movements intact.     Conjunctiva/sclera: Conjunctivae normal.     Pupils: Pupils are equal, round, and reactive to light.  Skin:    General: Skin is warm and dry.  Neurological:     General: No focal deficit present.     Mental Status: She is alert and oriented to person, place, and time.  Psychiatric:        Mood and Affect: Mood normal.        Behavior: Behavior normal.        Thought Content: Thought content normal.        Judgment: Judgment normal.      Assessment & Plan:  1. Frontal headache -We will place on high-dose oxygen therapy at 15 L via facemask in the office. -Sample of Nurtec given. - Follow up as needed  [employer]

## 2021-06-01 ENCOUNTER — Other Ambulatory Visit: Payer: Self-pay | Admitting: Family Medicine

## 2021-06-01 DIAGNOSIS — H6502 Acute serous otitis media, left ear: Secondary | ICD-10-CM

## 2021-06-17 ENCOUNTER — Emergency Department (HOSPITAL_COMMUNITY)
Admission: EM | Admit: 2021-06-17 | Discharge: 2021-06-17 | Disposition: A | Discharge status: Home / Self Care | Payer: Medicaid Other | Attending: Emergency Medicine | Admitting: Emergency Medicine

## 2021-06-17 ENCOUNTER — Other Ambulatory Visit: Payer: Self-pay

## 2021-06-17 ENCOUNTER — Encounter (HOSPITAL_COMMUNITY): Payer: Self-pay

## 2021-06-17 DIAGNOSIS — R197 Diarrhea, unspecified: Secondary | ICD-10-CM | POA: Diagnosis not present

## 2021-06-17 DIAGNOSIS — N9489 Other specified conditions associated with female genital organs and menstrual cycle: Secondary | ICD-10-CM | POA: Diagnosis not present

## 2021-06-17 DIAGNOSIS — Z5321 Procedure and treatment not carried out due to patient leaving prior to being seen by health care provider: Secondary | ICD-10-CM | POA: Diagnosis not present

## 2021-06-17 DIAGNOSIS — K219 Gastro-esophageal reflux disease without esophagitis: Secondary | ICD-10-CM | POA: Insufficient documentation

## 2021-06-17 DIAGNOSIS — R682 Dry mouth, unspecified: Secondary | ICD-10-CM | POA: Diagnosis not present

## 2021-06-17 DIAGNOSIS — R1030 Lower abdominal pain, unspecified: Secondary | ICD-10-CM | POA: Insufficient documentation

## 2021-06-17 DIAGNOSIS — R1013 Epigastric pain: Secondary | ICD-10-CM | POA: Diagnosis not present

## 2021-06-17 DIAGNOSIS — R002 Palpitations: Secondary | ICD-10-CM | POA: Insufficient documentation

## 2021-06-17 DIAGNOSIS — R110 Nausea: Secondary | ICD-10-CM | POA: Insufficient documentation

## 2021-06-17 DIAGNOSIS — R251 Tremor, unspecified: Secondary | ICD-10-CM | POA: Diagnosis not present

## 2021-06-17 LAB — COMPREHENSIVE METABOLIC PANEL
ALT: 14 U/L (ref 0–44)
AST: 15 U/L (ref 15–41)
Albumin: 4 g/dL (ref 3.5–5.0)
Alkaline Phosphatase: 49 U/L (ref 38–126)
Anion gap: 11 (ref 5–15)
BUN: 10 mg/dL (ref 6–20)
CO2: 23 mmol/L (ref 22–32)
Calcium: 9.5 mg/dL (ref 8.9–10.3)
Chloride: 100 mmol/L (ref 98–111)
Creatinine, Ser: 0.82 mg/dL (ref 0.44–1.00)
GFR, Estimated: >60 mL/min (ref >60)
Glucose, Bld: 131 mg/dL — ABNORMAL HIGH (ref 70–99)
Potassium: 3.6 mmol/L (ref 3.5–5.1)
Sodium: 134 mmol/L — ABNORMAL LOW (ref 135–145)
Total Bilirubin: 0.7 mg/dL (ref 0.3–1.2)
Total Protein: 7.5 g/dL (ref 6.5–8.1)

## 2021-06-17 LAB — URINALYSIS, MICROSCOPIC (REFLEX): Bacteria, UA: NONE SEEN

## 2021-06-17 LAB — URINALYSIS, ROUTINE W REFLEX MICROSCOPIC
Bilirubin Urine: NEGATIVE
Glucose, UA: NEGATIVE mg/dL
Ketones, ur: NEGATIVE mg/dL
Leukocytes,Ua: NEGATIVE
Nitrite: NEGATIVE
Protein, ur: NEGATIVE mg/dL
Specific Gravity, Urine: >1.03 — ABNORMAL HIGH (ref 1.005–1.030)
pH: 6 (ref 5.0–8.0)

## 2021-06-17 LAB — I-STAT BETA HCG BLOOD, ED (MC, WL, AP ONLY): I-stat hCG, quantitative: <5 m[IU]/mL (ref <5)

## 2021-06-17 LAB — CBC
HCT: 37.7 % (ref 36.0–46.0)
Hemoglobin: 12.6 g/dL (ref 12.0–15.0)
MCH: 31.2 pg (ref 26.0–34.0)
MCHC: 33.4 g/dL (ref 30.0–36.0)
MCV: 93.3 fL (ref 80.0–100.0)
Platelets: 354 10*3/uL (ref 150–400)
RBC: 4.04 MIL/uL (ref 3.87–5.11)
RDW: 11.9 % (ref 11.5–15.5)
WBC: 7.6 10*3/uL (ref 4.0–10.5)
nRBC: 0 % (ref 0.0–0.2)

## 2021-06-17 LAB — LIPASE, BLOOD: Lipase: 33 U/L (ref 11–51)

## 2021-06-17 MED ORDER — DIPHENHYDRAMINE HCL 25 MG PO CAPS
50.0000 mg | ORAL_CAPSULE | Freq: Once | ORAL | Status: AC
  Administered 2021-06-17: 50 mg via ORAL
  Filled 2021-06-17: qty 2

## 2021-06-17 MED ORDER — LORAZEPAM 1 MG PO TABS
1.0000 mg | ORAL_TABLET | Freq: Once | ORAL | Status: AC
  Administered 2021-06-17: 1 mg via ORAL
  Filled 2021-06-17: qty 1

## 2021-06-17 MED ORDER — ACETAMINOPHEN 325 MG PO TABS
650.0000 mg | ORAL_TABLET | Freq: Once | ORAL | Status: AC
  Administered 2021-06-17: 650 mg via ORAL
  Filled 2021-06-17: qty 2

## 2021-06-17 MED ORDER — ONDANSETRON 4 MG PO TBDP
4.0000 mg | ORAL_TABLET | Freq: Once | ORAL | Status: AC
  Administered 2021-06-17: 4 mg via ORAL
  Filled 2021-06-17: qty 1

## 2021-06-17 MED ORDER — METOCLOPRAMIDE HCL 10 MG PO TABS: 10.0000 mg | ORAL_TABLET | Freq: Once | ORAL | Status: DC

## 2021-06-17 NOTE — ED Notes (Signed)
Called patient for vitals, no response.

## 2021-06-17 NOTE — ED Notes (Signed)
Pt states she is going to leave as she feels better.

## 2021-06-17 NOTE — ED Provider Notes (Signed)
Emergency Medicine Provider Triage Evaluation Note  Veronica Raymond , a 35 y.o. female  was evaluated in triage.  The patient reports that around 9 PM she developed nausea.  She has a history of GERD and took a tablet of metoclopramide from a previous prescription.  She then developed diarrhea, nausea, dry mouth, palpitations, and diffuse body tremors.  She states that she also developed some epigastric burning pain as well as some lower abdominal pain.  She denies fever, chills, shortness of breath, vomiting, rash, numbness, weakness.  No other treatment prior to arrival.  No history of tremors.    Review of Systems  Positive: Tremors, nausea, diarrhea, dry mouth, palpitations, abdominal pain Negative: Fever, chills, shortness of breath, vomiting, rash, numbness, weakness  Physical Exam  BP 112/78   Pulse 68   Temp 97.9 F (36.6 C)   Resp 18   Ht 5\' 5"  (1.651 m)   Wt 83.9 kg   SpO2 98%   BMI 30.79 kg/m  Gen:   Awake, no distress   Resp:  Normal effort  MSK:   Moves extremities without difficulty  Other:  Rhythmic shaking noted in the bilateral upper lower extremities.  Medical Decision Making  Medically screening exam initiated at 1:31 AM.  Appropriate orders placed.  BRYNLEA SPINDLER was informed that the remainder of the evaluation will be completed by another provider, this initial triage assessment does not replace that evaluation, and the importance of remaining in the ED until their evaluation is complete.  On initial evaluation, patient was unable to tell me the name of the medication that she had taken prior to arrival.  Initially thought it was methocarbamol.  Labs were ordered.  However, patient continued to have nausea and on reevaluation offered to order metoclopramide, but patient now informs me that this is the medication that she took at home prior to arrival.  Suspect she may be having a dystonic reaction.  We will give Ativan for nausea and a dose of Benadryl.  She  will require further work-up and evaluation in the emergency department.    Joanne Gavel, PA-C 06/17/21 0523    Ezequiel Essex, MD 06/17/21 [phone]

## 2021-06-17 NOTE — ED Notes (Signed)
Called pt 3x for update vitals, no response. Went outside to look for pt and call name, no response.

## 2021-06-17 NOTE — ED Notes (Signed)
Requesting more nausea medication at this time

## 2021-06-17 NOTE — ED Notes (Addendum)
Pt returned to lobby after leaving. Pt stated she went home, but is back to still be seen. Will get get and updated vitals

## 2021-06-17 NOTE — ED Triage Notes (Signed)
Drove self. Around 2100 developed nausea. Dx with GERD. Took a medication that she was prescribed a year ago. Developed diarrhea, nausea, dry mouth, felt like her heart rate increased, developed the shakes.that she feels really bad. Called EMS pt refused EMS tried to wait it out and see if it would pass. C/o abd pain lower abd, burning sensation in the chest

## 2021-08-04 ENCOUNTER — Other Ambulatory Visit: Payer: Self-pay

## 2021-08-05 ENCOUNTER — Encounter: Payer: Self-pay | Admitting: Family Medicine

## 2021-08-05 ENCOUNTER — Ambulatory Visit (INDEPENDENT_AMBULATORY_CARE_PROVIDER_SITE_OTHER): Payer: Medicaid Other | Admitting: Family Medicine

## 2021-08-05 VITALS — BP 122/82 | HR 70 | Resp 16 | Ht 65.0 in | Wt 181.0 lb

## 2021-08-05 DIAGNOSIS — H5319 Other subjective visual disturbances: Secondary | ICD-10-CM

## 2021-08-05 DIAGNOSIS — R110 Nausea: Secondary | ICD-10-CM | POA: Diagnosis not present

## 2021-08-05 DIAGNOSIS — K219 Gastro-esophageal reflux disease without esophagitis: Secondary | ICD-10-CM

## 2021-08-05 DIAGNOSIS — R519 Headache, unspecified: Secondary | ICD-10-CM

## 2021-08-05 DIAGNOSIS — R109 Unspecified abdominal pain: Secondary | ICD-10-CM | POA: Diagnosis not present

## 2021-08-05 MED ORDER — NORTRIPTYLINE HCL 10 MG PO CAPS
10.0000 mg | ORAL_CAPSULE | Freq: Every day | ORAL | 1 refills | Status: DC
Start: 2021-08-05 — End: 2022-03-01

## 2021-08-05 MED ORDER — PANTOPRAZOLE SODIUM 40 MG PO TBEC: 40.0000 mg | DELAYED_RELEASE_TABLET | Freq: Every day | ORAL | 0 refills | Status: DC

## 2021-08-05 NOTE — Patient Instructions (Addendum)
A few things to remember from today's visit:   Headache, unspecified headache type - Plan: nortriptyline (PAMELOR) 10 MG capsule  Visual distortion  Nausea without vomiting - Plan: Ambulatory referral to Gastroenterology, US Abdomen Limited RUQ (LIVER/GB)  Left sided abdominal pain - Plan: Ambulatory referral to Gastroenterology  Gastroesophageal reflux disease without esophagitis - Plan: pantoprazole (PROTONIX) 40 MG tablet  If you need refills please call your pharmacy. Do not use My Chart to request refills or for acute issues that need immediate attention.   Resume Nortriptyline and protonix. Eye flash symptoms could be an ophthalmic migraine. Headache diary.  Please be sure medication list is accurate. If a new problem present, please set up appointment sooner than planned today.

## 2021-08-05 NOTE — Progress Notes (Signed)
ACUTE VISIT Chief Complaint  Patient presents with   Photophobia   HPI: [name] is a 35 y.o. female with hx of migraine headaches, allergies,fibromyalgia,vit D def,and Sjogren synd  here today complaining of "flashing light" on peripheral vision of left eye. Problem started about 2-3 weeks. No associated conjunctival erythema,eye pain,epiphora,N/V,or focal neurologic deficit.  New problem. + Photophobia.  Sudden onset and last a few seconds. She has not identified exacerbating or alleviating factors. It is not followed by headache. Evaluated by her eye care provider and examination was negative.  -Migraine headache: She has had "a lot of migraines." Temporal and sometimes retroocular. Sharp/pressure like pain. Associated photophobia and sometimes nausea. No visual aura or vomiting.  She has been taking samples of Nortec and Ibuprofen. She was on Nortriptyline before and she thinks it was helping with headaches, not sure why she discontinued.  Head CT in 08/2020: Negative. Brain MRI in 10/2018: Normal examination. No abnormality seen to explain the presenting symptoms. No evidence of demyelinating disease.  Lab Results  Component Value Date   WBC 7.6 06/17/2021   HGB 12.6 06/17/2021   HCT 37.7 06/17/2021   MCV 93.3 06/17/2021   PLT 354 06/17/2021   -She has a "lot of nausea" for the past 3 weeks.This is not related to above problems.  She has not identified exacerbating or alleviating factors. Abdominal pain, "little" sharp left-sided for pain for about 10 days. It is not alleviated by defecation. Burning like sensation in chest. No associated dyspnea,CP,or diaphoresis.  + Heartburn. Pepcid is not helping. She was on Protonix before.  Diarrhea when she is nauseated. + Stress. She has daily bowel movements. Lab Results  Component Value Date   CREATININE 0.82 06/17/2021   BUN 10 06/17/2021   NA 134 (L) 06/17/2021   K 3.6 06/17/2021   CL 100  06/17/2021   CO2 23 06/17/2021   Lab Results  Component Value Date   ALT 14 06/17/2021   AST 15 06/17/2021   ALKPHOS 49 06/17/2021   BILITOT 0.7 06/17/2021   Review of Systems  Constitutional:  Positive for fatigue. Negative for activity change, appetite change and fever.  HENT:  Negative for mouth sores, nosebleeds, sore throat and trouble swallowing.   Respiratory:  Negative for cough and wheezing.   Gastrointestinal:        Negative for changes in bowel habits.  Genitourinary:  Negative for decreased urine volume, dysuria and hematuria.  Neurological:  Negative for syncope and facial asymmetry.  Psychiatric/Behavioral:  Negative for confusion. The patient is nervous/anxious.   Rest see pertinent positives and negatives per HPI.  Current Outpatient Medications on File Prior to Visit  Medication Sig Dispense Refill   cetirizine (ZYRTEC ALLERGY) 10 MG tablet Take 1 tablet (10 mg total) by mouth daily. 30 tablet 1   fluticasone (FLONASE) 50 MCG/ACT nasal spray SHAKE LIQUID AND USE 1 SPRAY IN EACH NOSTRIL DAILY 16 g 0   Multiple Vitamin (MULTIVITAMIN) tablet Take 1 tablet by mouth daily.     No current facility-administered medications on file prior to visit.   Past Medical History:  Diagnosis Date   Allergic rhinitis    Anxiety    Bartholin gland cyst 05/04/2013   GERD (gastroesophageal reflux disease)    Migraine without aura, without mention of intractable migraine without mention of status migrainosus 07/09/2013   Ovarian cyst 2014   Polyarthralgia    Sjogren's syndrome ([place])    Vaginal Pap smear, abnormal    Allergies  Allergen Reactions   Decongestant [Pseudoephedrine] Other (See Comments)    SI    Iodinated Diagnostic Agents Hives   Percocet [Oxycodone-Acetaminophen] Itching   Iohexol Hives and Itching   Oxycodone-Acetaminophen Other (See Comments)   Sulfa Antibiotics Hives and Itching    Social History   Socioeconomic History   Marital status: Single     Spouse name: Not on file   Number of children: 3   Years of education: college   Highest education level: Not on file  Occupational History   Occupation: CMA    Employer: [employer]    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[employer]  Tobacco Use   Smoking status: Never   Smokeless tobacco: Never  Vaping Use   Vaping Use: Never used  Substance and Sexual Activity   Alcohol use: Yes    Comment: rarely   Drug use: No   Sexual activity: Not on file  Other Topics Concern   Not on file  Social History Narrative   Lives with her 4 children      Lives in two story home      Right handed      Highest level of edu- [employer]      Social Determinants of Health   Financial Resource Strain: Not on file  Food Insecurity: Not on file  Transportation Needs: Not on file  Physical Activity: Not on file  Stress: Not on file  Social Connections: Not on file   Vitals:   08/05/21 1216  BP: 122/82  Pulse: 70  Resp: 16  SpO2: 98%   Body mass index is 30.12 kg/m.  Physical Exam Vitals and nursing note reviewed.  Constitutional:      General: She is not in acute distress.    Appearance: She is well-developed.  HENT:     Head: Normocephalic and atraumatic.     Mouth/Throat:     Mouth: Mucous membranes are moist.     Pharynx: Oropharynx is clear.  Eyes:     Conjunctiva/sclera: Conjunctivae normal.  Cardiovascular:     Rate and Rhythm: Normal rate and regular rhythm.     Pulses:          Dorsalis pedis pulses are 2+ on the right side and 2+ on the left side.     Heart sounds: No murmur heard. Pulmonary:     Effort: Pulmonary effort is normal. No respiratory distress.     Breath sounds: Normal breath sounds.  Abdominal:     Palpations: Abdomen is soft. There is no hepatomegaly or mass.     Tenderness: There is abdominal tenderness in the epigastric area. There is no guarding or rebound. Negative signs include Murphy's sign.  Lymphadenopathy:     Cervical: No cervical adenopathy.  Skin:     General: Skin is warm.     Findings: No erythema or rash.  Neurological:     General: No focal deficit present.     Mental Status: She is alert and oriented to person, place, and time.     Cranial Nerves: No cranial nerve deficit.     Gait: Gait normal.  Psychiatric:     Comments: Well groomed, good eye contact.   ASSESSMENT AND PLAN:  [name] was seen today for photophobia.  Diagnoses and all orders for this visit: Orders Placed This Encounter  Procedures   US Abdomen Limited RUQ (LIVER/GB)   Ambulatory referral to Gastroenterology   Visual distortion Reporting normal eye examination. ? Ophthalmic migraine. Nortriptyline may help. Monitor for  new symptoms. Instructed about warning signs.  Headache, unspecified headache type Nortriptyline helped, she agrees with resuming it.Some side effects discussed. Recommend starting a headache diary. Instructed about warning signs.  -     nortriptyline (PAMELOR) 10 MG capsule; Take 1 capsule (10 mg total) by mouth at bedtime.  Nausea without vomiting We discussed possible etiologies. ? GERD,gall bladder disease. PPI started today. RUQ [name] will be arranged. If not resolved she may need EGD.  Left sided abdominal pain Hx and examination do to suggest a serious process. She would like GI evaluation. Adequate hydration and fluid intake. Instructed about warning signs.  Gastroesophageal reflux disease without esophagitis Resume Protonix 40 mg daily 30 min before breakfast for 6-8 weeks. GERD precautions also recommended.  -     pantoprazole (PROTONIX) 40 MG tablet; Take 1 tablet (40 mg total) by mouth daily.  Return in about 6 weeks (around 09/16/2021).  Aahil Fredin G. Martinique, MD  [facility]. [place] office.

## 2021-08-13 ENCOUNTER — Ambulatory Visit
Admission: RE | Admit: 2021-08-13 | Discharge: 2021-08-13 | Disposition: A | Discharge status: Home / Self Care | Payer: Medicaid Other | Source: Ambulatory Visit | Attending: Family Medicine | Admitting: Family Medicine

## 2021-08-13 DIAGNOSIS — R110 Nausea: Secondary | ICD-10-CM

## 2021-08-14 ENCOUNTER — Other Ambulatory Visit: Payer: Self-pay | Admitting: Family Medicine

## 2021-08-14 DIAGNOSIS — R160 Hepatomegaly, not elsewhere classified: Secondary | ICD-10-CM

## 2021-08-14 DIAGNOSIS — K769 Liver disease, unspecified: Secondary | ICD-10-CM

## 2021-09-14 ENCOUNTER — Other Ambulatory Visit: Payer: Self-pay

## 2021-09-14 ENCOUNTER — Telehealth (INDEPENDENT_AMBULATORY_CARE_PROVIDER_SITE_OTHER): Payer: Medicaid Other | Admitting: Family Medicine

## 2021-09-14 ENCOUNTER — Encounter: Payer: Self-pay | Admitting: Family Medicine

## 2021-09-14 VITALS — Ht 65.0 in

## 2021-09-14 DIAGNOSIS — U071 COVID-19: Secondary | ICD-10-CM | POA: Diagnosis not present

## 2021-09-14 DIAGNOSIS — J029 Acute pharyngitis, unspecified: Secondary | ICD-10-CM

## 2021-09-14 LAB — POC COVID19 BINAXNOW: SARS Coronavirus 2 Ag: NEGATIVE

## 2021-09-14 LAB — POCT INFLUENZA A/B
Influenza A, POC: NEGATIVE
Influenza B, POC: NEGATIVE

## 2021-09-14 LAB — POCT RAPID STREP A (OFFICE): Rapid Strep A Screen: NEGATIVE

## 2021-09-14 NOTE — Progress Notes (Signed)
Virtual Visit via Video Note I connected with Veronica Raymond on 09/14/21 by a video enabled telemedicine application and verified that I am speaking with the correct person using two identifiers.  Location patient: home Location provider:work office Persons participating in the virtual visit: patient, provider  I discussed the limitations of evaluation and management by telemedicine and the availability of in person appointments. The patient expressed understanding and agreed to proceed.  Chief Complaint  Patient presents with   Covid Positive   HPI: Veronica Raymond is a 35 year old female with history of allergies, anxiety, Sjogren's syndrome, GERD, and migraine headaches who had a positive COVID-19 test today. Yesterday she started with "scratchy throat", "little" nonproductive cough, postnasal drainage, rhinorrhea, and nasal congestion. Negative for fever, chills, anosmia,ageusia, body aches, CP, dyspnea, palpitation, wheezing, abdominal pain, nausea, vomiting, changes in bowel habits, urinary symptoms, or skin rash. She has taking Tylenol.  No known sick contact. Vaccination completed.  ROS: See pertinent positives and negatives per HPI.  Past Medical History:  Diagnosis Date   Allergic rhinitis    Anxiety    Bartholin gland cyst 05/04/2013   GERD (gastroesophageal reflux disease)    Migraine without aura, without mention of intractable migraine without mention of status migrainosus 07/09/2013   Ovarian cyst 2014   Polyarthralgia    Sjogren's syndrome ([place])    Vaginal Pap smear, abnormal    Past Surgical History:  Procedure Laterality Date   ADENOIDECTOMY     ESSURE TUBAL LIGATION  07-05-2014   THERAPEUTIC ABORTION     TONSILLECTOMY     TYMPANOSTOMY TUBE PLACEMENT      Family History  Problem Relation Age of Onset   Hypertension Mother    Sleep apnea Mother    Obesity Mother    Heart disease Father    Alcohol abuse Father        dying from this   Diabetes Father     High blood pressure Father    Anxiety disorder Father    AAA (abdominal aortic aneurysm) Father    Mental retardation Maternal Uncle    Diabetes Maternal Grandmother    Hypertension Maternal Grandmother    Kidney disease Maternal Grandmother    Heart disease Maternal Grandmother    Stroke Maternal Grandmother    Prostate cancer Maternal Grandfather    Alcohol abuse Other    Hypertension Paternal Grandmother     Social History   Socioeconomic History   Marital status: Single    Spouse name: Not on file   Number of children: 3   Years of education: college   Highest education level: Not on file  Occupational History   Occupation: CMA    Employer: [employer]    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[employer]  Tobacco Use   Smoking status: Never   Smokeless tobacco: Never  Vaping Use   Vaping Use: Never used  Substance and Sexual Activity   Alcohol use: Yes    Comment: rarely   Drug use: No   Sexual activity: Not on file  Other Topics Concern   Not on file  Social History Narrative   Lives with her 4 children      Lives in two story home      Right handed      Highest level of edu- [employer]      Social Determinants of Health   Financial Resource Strain: Not on file  Food Insecurity: Not on file  Transportation Needs: Not on file  Physical Activity: Not  on file  Stress: Not on file  Social Connections: Not on file  Intimate Partner Violence: Not on file      Current Outpatient Medications:    cetirizine (ZYRTEC ALLERGY) 10 MG tablet, Take 1 tablet (10 mg total) by mouth daily., Disp: 30 tablet, Rfl: 1   fluticasone (FLONASE) 50 MCG/ACT nasal spray, SHAKE LIQUID AND USE 1 SPRAY IN EACH NOSTRIL DAILY, Disp: 16 g, Rfl: 0   Multiple Vitamin (MULTIVITAMIN) tablet, Take 1 tablet by mouth daily., Disp: , Rfl:    nortriptyline (PAMELOR) 10 MG capsule, Take 1 capsule (10 mg total) by mouth at bedtime., Disp: 90 capsule, Rfl: 1   pantoprazole (PROTONIX) 40 MG tablet, Take 1  tablet (40 mg total) by mouth daily., Disp: 90 tablet, Rfl: 0  EXAM:  VITALS per patient if applicable:  GENERAL: alert, oriented, appears well and in no acute distress  HEENT: atraumatic, conjunctiva clear, no obvious abnormalities on inspection of external nose and ears  NECK: normal movements of the head and neck  LUNGS: on inspection no signs of respiratory distress, breathing rate appears normal, no obvious gross SOB, gasping or wheezing  CV: no obvious cyanosis  MS: moves all visible extremities without noticeable abnormality  PSYCH/NEURO: pleasant and cooperative, no obvious depression or anxiety, speech and thought processing grossly intact  ASSESSMENT AND PLAN:  Discussed the following assessment and plan:  COVID-19 virus infection  Sore throat - Plan: POC COVID-19, POCT rapid strep A, POCT Influenza A/B We discussed Dx,possible complications and treatment options. She has a mild case with low risk for complications. We discussed oral antiviral options and side effects. She does not want to try antiviral medication at this time. Instructed to monitor for new symptoms.  Symptomatic treatment with plenty of fluids,rest,tylenol 500 mg 3-4 times per day prn. Throat lozenges if needed for sore throat. Explained that cough and congestion may last a few more days and even weeks after acute symptoms have resolved. Clearly instructed about warning signs. She will let us know if she decides to take antiviral medication. Recommend completing 5 days of quarantine.  We discussed possible serious and likely etiologies, options for evaluation and workup, limitations of telemedicine visit vs in person visit, treatment, treatment risks and precautions. The patient was advised to call back or seek an in-person evaluation if the symptoms worsen or if the condition fails to improve as anticipated. I discussed the assessment and treatment plan with the patient. Veronica Raymond was provided  an opportunity to ask questions and all were answered. She agreed with the plan and demonstrated an understanding of the instructions.  Return if symptoms worsen or fail to improve.  [name], MD  [facility]. [place] office.

## 2021-09-15 ENCOUNTER — Telehealth: Payer: Medicaid Other | Admitting: Family Medicine

## 2021-09-30 ENCOUNTER — Telehealth: Payer: Self-pay | Admitting: Family Medicine

## 2021-09-30 NOTE — Telephone Encounter (Signed)
Selinda Eon with [facility] is calling and need authorization for pt to have MRI of liver on 10-05-2021

## 2021-10-01 NOTE — Telephone Encounter (Signed)
Authorization Number: [id number] Review Date: 10/01/2021 3:40:52 PM Expiration Date: 11/15/2021 Status: Your case has been Approved.

## 2021-10-04 ENCOUNTER — Encounter (HOSPITAL_COMMUNITY): Payer: Self-pay | Admitting: Emergency Medicine

## 2021-10-04 ENCOUNTER — Ambulatory Visit (HOSPITAL_COMMUNITY)
Admission: EM | Admit: 2021-10-04 | Discharge: 2021-10-04 | Disposition: A | Discharge status: Home / Self Care | Payer: Medicaid Other | Attending: Urgent Care | Admitting: Urgent Care

## 2021-10-04 ENCOUNTER — Other Ambulatory Visit: Payer: Self-pay

## 2021-10-04 DIAGNOSIS — R0789 Other chest pain: Secondary | ICD-10-CM | POA: Diagnosis not present

## 2021-10-04 DIAGNOSIS — M79602 Pain in left arm: Secondary | ICD-10-CM

## 2021-10-04 DIAGNOSIS — Z20822 Contact with and (suspected) exposure to covid-19: Secondary | ICD-10-CM | POA: Insufficient documentation

## 2021-10-04 LAB — SARS CORONAVIRUS 2 (TAT 6-24 HRS): SARS Coronavirus 2: NEGATIVE

## 2021-10-04 MED ORDER — TIZANIDINE HCL 4 MG PO TABS
4.0000 mg | ORAL_TABLET | Freq: Every day | ORAL | 0 refills | Status: DC
Start: 2021-10-04 — End: 2022-01-11

## 2021-10-04 MED ORDER — NAPROXEN 500 MG PO TABS: 500.0000 mg | ORAL_TABLET | Freq: Two times a day (BID) | ORAL | 0 refills | Status: DC

## 2021-10-04 NOTE — ED Triage Notes (Signed)
Had migraine Thursday. Reports felt fine Friday. Reports went roller skating and got drunk and had intercourse. Friday night left tingling started when trying to go to sleep. Woke up Saturday with soreness in left arm and left breast. Reports pains still there and worse with movement today so wanted to be see. Denies falls or injuries.

## 2021-10-04 NOTE — ED Provider Notes (Signed)
[place]   MRN: [id number] DOB: [birth year]  Subjective:   Veronica Raymond is a 36 y.o. female presenting for an evaluation of left-sided chest pain that goes to the left arm.  These particular symptoms started yesterday.  She also had a migraine on Thursday but resolved by Friday.  She felt fine all Friday but she did go out and have drinks, woke up the next day with her symptoms that she is presenting for today.  No history of drug use, cocaine use.  No history of heart conditions, arrhythmias.  No cough, shortness of breath, wheezing, nausea, vomiting, abdominal pain, diaphoresis.  No current facility-administered medications for this encounter.  Current Outpatient Medications:    cetirizine (ZYRTEC ALLERGY) 10 MG tablet, Take 1 tablet (10 mg total) by mouth daily., Disp: 30 tablet, Rfl: 1   fluticasone (FLONASE) 50 MCG/ACT nasal spray, SHAKE LIQUID AND USE 1 SPRAY IN EACH NOSTRIL DAILY, Disp: 16 g, Rfl: 0   Multiple Vitamin (MULTIVITAMIN) tablet, Take 1 tablet by mouth daily., Disp: , Rfl:    nortriptyline (PAMELOR) 10 MG capsule, Take 1 capsule (10 mg total) by mouth at bedtime., Disp: 90 capsule, Rfl: 1   pantoprazole (PROTONIX) 40 MG tablet, Take 1 tablet (40 mg total) by mouth daily., Disp: 90 tablet, Rfl: 0   Allergies  Allergen Reactions   Decongestant [Pseudoephedrine] Other (See Comments)    SI    Iodinated Contrast Media Hives   Percocet [Oxycodone-Acetaminophen] Itching   Iohexol Hives and Itching   Oxycodone-Acetaminophen Other (See Comments)   Sulfa Antibiotics Hives and Itching    Past Medical History:  Diagnosis Date   Allergic rhinitis    Anxiety    Bartholin gland cyst 05/04/2013   GERD (gastroesophageal reflux disease)    Migraine without aura, without mention of intractable migraine without mention of status migrainosus 07/09/2013   Ovarian cyst 2014   Polyarthralgia    Sjogren's syndrome ([place])    Vaginal Pap smear, abnormal       Past Surgical History:  Procedure Laterality Date   ADENOIDECTOMY     ESSURE TUBAL LIGATION  07-05-2014   THERAPEUTIC ABORTION     TONSILLECTOMY     TYMPANOSTOMY TUBE PLACEMENT      Family History  Problem Relation Age of Onset   Hypertension Mother    Sleep apnea Mother    Obesity Mother    Heart disease Father    Alcohol abuse Father        dying from this   Diabetes Father    High blood pressure Father    Anxiety disorder Father    AAA (abdominal aortic aneurysm) Father    Mental retardation Maternal Uncle    Diabetes Maternal Grandmother    Hypertension Maternal Grandmother    Kidney disease Maternal Grandmother    Heart disease Maternal Grandmother    Stroke Maternal Grandmother    Prostate cancer Maternal Grandfather    Alcohol abuse Other    Hypertension Paternal Grandmother     Social History   Tobacco Use   Smoking status: Never   Smokeless tobacco: Never  Vaping Use   Vaping Use: Never used  Substance Use Topics   Alcohol use: Yes    Comment: rarely   Drug use: No    ROS   Objective:   Vitals: BP 105/67 (BP Location: Right Arm)    Pulse 72    Temp 99.4 F (37.4 C) (Oral)    Resp  17    LMP 09/20/2021    SpO2 98%   Physical Exam Constitutional:      General: She is not in acute distress.    Appearance: Normal appearance. She is well-developed. She is not ill-appearing, toxic-appearing or diaphoretic.  HENT:     Head: Normocephalic and atraumatic.     Right Ear: External ear normal.     Left Ear: External ear normal.     Nose: Nose normal.     Mouth/Throat:     Mouth: Mucous membranes are moist.  Eyes:     General: No scleral icterus.       Right eye: No discharge.        Left eye: No discharge.     Extraocular Movements: Extraocular movements intact.  Cardiovascular:     Rate and Rhythm: Normal rate and regular rhythm.     Pulses: Normal pulses.     Heart sounds: Normal heart sounds. No murmur heard.   No friction rub. No gallop.   Pulmonary:     Effort: Pulmonary effort is normal. No respiratory distress.     Breath sounds: Normal breath sounds. No stridor. No wheezing, rhonchi or rales.  Chest:     Chest wall: Tenderness present.  Skin:    General: Skin is warm and dry.     Findings: No rash.  Neurological:     Mental Status: She is alert and oriented to person, place, and time.  Psychiatric:        Mood and Affect: Mood normal.        Behavior: Behavior normal.        Thought Content: Thought content normal.    ED ECG REPORT   Date: 10/04/2021  Rate: 73bpm  Rhythm: normal sinus rhythm  QRS Axis: normal  Intervals: normal  ST/T Wave abnormalities: nonspecific T wave changes  Conduction Disutrbances:none  Narrative Interpretation: Sinus rhythm at 73 bpm with nonspecific T wave flattening in lead aVL.  Old EKG Reviewed: Unchanged.  I have personally reviewed the EKG tracing and agree with the computerized printout as noted.   Assessment and Plan :   PDMP not reviewed this encounter.  1. Chest wall pain   2. Left arm pain    Patient has reproducible chest wall tenderness of the left side.  No signs of ACS on exam. Deferred imaging given clear cardiopulmonary exam, hemodynamically stable vital signs.  Recommended a COVID test given her chest pain, recent headache.  Otherwise recommended naproxen, tizanidine for musculoskeletal pain. Counseled patient on potential for adverse effects with medications prescribed/recommended today, ER and return-to-clinic precautions discussed, patient verbalized understanding.    Jaynee Eagles, PA-C 10/04/21 1312

## 2021-10-05 ENCOUNTER — Other Ambulatory Visit: Payer: Medicaid Other

## 2021-10-05 ENCOUNTER — Ambulatory Visit
Admission: RE | Admit: 2021-10-05 | Discharge: 2021-10-05 | Disposition: A | Discharge status: Home / Self Care | Payer: Medicaid Other | Source: Ambulatory Visit | Attending: Family Medicine | Admitting: Family Medicine

## 2021-10-05 DIAGNOSIS — R160 Hepatomegaly, not elsewhere classified: Secondary | ICD-10-CM

## 2021-10-05 MED ORDER — GADOBENATE DIMEGLUMINE 529 MG/ML IV SOLN
16.0000 mL | Freq: Once | INTRAVENOUS | Status: AC | PRN
  Administered 2021-10-05: 16 mL via INTRAVENOUS

## 2021-10-19 ENCOUNTER — Encounter: Payer: Self-pay | Admitting: Family Medicine

## 2021-10-19 ENCOUNTER — Telehealth (INDEPENDENT_AMBULATORY_CARE_PROVIDER_SITE_OTHER): Payer: Medicaid Other | Admitting: Family Medicine

## 2021-10-19 VITALS — Temp 98.5°F

## 2021-10-19 DIAGNOSIS — U071 COVID-19: Secondary | ICD-10-CM | POA: Diagnosis not present

## 2021-10-19 NOTE — Progress Notes (Signed)
Virtual Visit via Telephone Note  I connected with Veronica Raymond on 10/19/21 at  4:30 PM EST by telephone and verified that I am speaking with the correct person using two identifiers.   I discussed the limitations, risks, security and privacy concerns of performing an evaluation and management service by telephone and the availability of in person appointments. I also discussed with the patient that there may be a patient responsible charge related to this service. The patient expressed understanding and agreed to proceed.  Location patient: home Location provider: work or home office Participants present for the call: patient, provider Patient did not have a visit in the prior 7 days to address this/these issue(s).  Chief Complaint  Patient presents with   Cough    Productive   Covid Positive    Onset of sx: 10/15/21    History of Present Illness: Pt with cough, congestion, HA, slight chest tightness, abd cramps, chills, scratch throat, and ear pressure.   Denies wheezing, diarrhea, fever. Positive COVID test Saturday 10/17/2021 Symptoms started on Thursday 10/15/2021.   Sick contacts include patient's daughters who were sick during the week. Pt tried Advil on Saturday for symptoms and a cough drop today. Pt has an allergy to decongestants   Observations/Objective: Patient sounds cheerful and well on the phone. I do not appreciate any SOB. Speech and thought processing are grossly intact. Patient reported vitals:  Assessment and Plan: COVID-19 virus infection -Symptoms started Thursday 10/15/21 -positive at home COVID test 10/17/21 -Mild symptoms and outside of window for antiviral medications. -Discussed supportive care with OTC cough/cold medication, gargling with warm salt water or Chloraseptic spray, steam from shower, rest, hydration, OTC antihistamine -Given history of allergy to decongestant pseudoephedrine consider using OTC cough/cold medication for people with high  blood pressure. -Given strict precautions  Follow Up Instructions:  F/u prn    [id number] [id number] [id number] 11-20 [id number] 21-30 I did not refer this patient for an OV in the next 24 hours for this/these issue(s).  I discussed the assessment and treatment plan with the patient. The patient was provided an opportunity to ask questions and all were answered. The patient agreed with the plan and demonstrated an understanding of the instructions.   The patient was advised to call back or seek an in-person evaluation if the symptoms worsen or if the condition fails to improve as anticipated.  I provided 7 minutes of non-face-to-face time during this encounter.   Billie Ruddy, MD

## 2021-10-20 ENCOUNTER — Ambulatory Visit (HOSPITAL_COMMUNITY)
Admission: EM | Admit: 2021-10-20 | Discharge: 2021-10-20 | Disposition: A | Discharge status: Home / Self Care | Payer: Medicaid Other | Attending: Student | Admitting: Student

## 2021-10-20 ENCOUNTER — Other Ambulatory Visit: Payer: Self-pay

## 2021-10-20 ENCOUNTER — Encounter (HOSPITAL_COMMUNITY): Payer: Self-pay

## 2021-10-20 DIAGNOSIS — Z8669 Personal history of other diseases of the nervous system and sense organs: Secondary | ICD-10-CM | POA: Insufficient documentation

## 2021-10-20 DIAGNOSIS — U071 COVID-19: Secondary | ICD-10-CM | POA: Diagnosis present

## 2021-10-20 DIAGNOSIS — N760 Acute vaginitis: Secondary | ICD-10-CM | POA: Diagnosis not present

## 2021-10-20 DIAGNOSIS — H938X3 Other specified disorders of ear, bilateral: Secondary | ICD-10-CM | POA: Insufficient documentation

## 2021-10-20 DIAGNOSIS — H6123 Impacted cerumen, bilateral: Secondary | ICD-10-CM | POA: Insufficient documentation

## 2021-10-20 MED ORDER — AMOXICILLIN 875 MG PO TABS: 875.0000 mg | ORAL_TABLET | Freq: Two times a day (BID) | ORAL | 0 refills | Status: AC

## 2021-10-20 NOTE — Discharge Instructions (Addendum)
-  Amoxicillin twice daily x7 days -Follow-up with ENT if symptoms persist  -We'll call in 2-3 days with results of your vaginal swab, we can send medications if necessary

## 2021-10-20 NOTE — ED Provider Notes (Signed)
[place]    CSN: [id number] Arrival date & time: 10/20/21  1003      History   Chief Complaint Chief Complaint  Patient presents with   Covid Positive   Otalgia    HPI Veronica Raymond is a 36 y.o. female presenting with ear pain and vaginal irritation.  Positive home COVID test on 10/17/2021.  Has been treating her symptoms with over-the-counter medications with relief, but now the left ear pain is getting worse. Muffled hearing and pressure. Attempted nasal saline with some relief. States now she can hear it popping. Cough is nonproductive. Denies SOB, CP. Denies fevers. Has attempted 2 advil on 1/21 and the nasal spray, nothing else. Denies history pulm ds.   Also with vaginal irritation following using a soap on her perineum per pt. States she uses bath and body works on her body, and cerave in the vaginal area. Some bath and bodyworks got into the vaginal area. Now with irritation that is burning and lingering. Hasn't tried interventions at home. Denies STI risk. Denies discharge. Distant history BV.  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 27/78/2423   Fibromyalgia 06/24/2020   Sjogren's disease ([place]) 06/24/2020   Vitamin D deficiency 08/20/2019   Class 1 obesity due to excess calories without serious comorbidity with body mass index (BMI) of 31.0 to 31.9 in adult 08/16/2019   Polyarthralgia 03/07/2019   History of abnormal cervical Pap smear 01/28/2018   Allergic rhinitis    GERD (gastroesophageal reflux disease)    Migraine without aura  07/09/2013    Past Surgical History:  Procedure Laterality Date   ADENOIDECTOMY     ESSURE TUBAL LIGATION  07-05-2014   THERAPEUTIC ABORTION     TONSILLECTOMY     TYMPANOSTOMY TUBE PLACEMENT      OB History     Gravida  5   Para  4   Term  4   Preterm      AB  1   Living  4      SAB      IAB  1   Ectopic      Multiple      Live Births  4            Home Medications    Prior to Admission medications   Medication Sig Start Date End Date Taking? Authorizing Provider  amoxicillin (AMOXIL) 875 MG tablet Take 1 tablet (875 mg total) by mouth 2 (two) times daily for 7 days. 10/20/21 10/27/21 Yes Hazel Sams, PA-C  cetirizine (ZYRTEC ALLERGY) 10 MG tablet Take 1 tablet (10 mg total) by mouth daily. 09/13/20   Volney American, PA-C  fluticasone [facility]) 50 MCG/ACT nasal spray SHAKE LIQUID AND USE 1 SPRAY IN [facility] NOSTRIL DAILY 11/17/20   [place], Betty G, MD  Multiple Vitamin (MULTIVITAMIN) tablet Take 1 tablet by mouth daily.    [provider]  naproxen (NAPROSYN) 500 MG tablet Take 1 tablet (500 mg total) by mouth 2 (two) times daily with a meal.  10/04/21   Jaynee Eagles, PA-C  nortriptyline (PAMELOR) 10 MG capsule Take 1 capsule (10 mg total) by mouth at bedtime. 08/05/21   [place], Betty G, MD  pantoprazole (PROTONIX) 40 MG tablet Take 1 tablet (40 mg total) by mouth daily. 08/05/21   [place], Betty G, MD  tiZANidine (ZANAFLEX) 4 MG tablet Take 1 tablet (4 mg total) by mouth at bedtime. 10/04/21   Jaynee Eagles, PA-C    Family History Family History  Problem Relation Age of Onset   Hypertension Mother    Sleep apnea Mother    Obesity Mother    Heart disease Father    Alcohol abuse Father        dying from this   Diabetes Father    High blood pressure Father    Anxiety disorder Father    AAA (abdominal aortic aneurysm) Father    Mental retardation Maternal Uncle    Diabetes Maternal Grandmother    Hypertension Maternal Grandmother    Kidney  disease Maternal Grandmother    Heart disease Maternal Grandmother    Stroke Maternal Grandmother    Prostate cancer Maternal Grandfather    Alcohol abuse Other    Hypertension Paternal Grandmother     Social History Social History   Tobacco Use   Smoking status: Never   Smokeless tobacco: Never  Vaping Use   Vaping Use: Never used  Substance Use Topics   Alcohol use: Yes    Comment: rarely   Drug use: No     Allergies   Decongestant [pseudoephedrine], Iodinated contrast media, Percocet [oxycodone-acetaminophen], Iohexol, Oxycodone-acetaminophen, and Sulfa antibiotics   Review of Systems Review of Systems  Constitutional:  Negative for appetite change, chills and fever.  HENT:  Positive for congestion and ear pain. Negative for rhinorrhea, sinus pressure, sinus pain and sore throat.   Eyes:  Negative for redness and visual disturbance.  Respiratory:  Positive for cough. Negative for chest tightness, shortness of breath and wheezing.   Cardiovascular:  Negative for chest pain and palpitations.  Gastrointestinal:  Negative for abdominal pain, constipation, diarrhea, nausea and vomiting.  Genitourinary:  Positive for dysuria. Negative for decreased urine volume, frequency, genital sores, hematuria, menstrual problem, pelvic pain, urgency, vaginal bleeding, vaginal discharge and vaginal pain.  Musculoskeletal:  Negative for myalgias.  Neurological:  Negative for dizziness, weakness and headaches.  Psychiatric/Behavioral:  Negative for confusion.   All other systems reviewed and are negative.   Physical Exam Triage Vital Signs ED Triage Vitals  Enc Vitals Group     BP 10/20/21 1038 124/74     Pulse Rate 10/20/21 1038 80     Resp 10/20/21 1038 18     Temp 10/20/21 1038 98.9 F (37.2 C)     Temp Source 10/20/21 1038 Oral     SpO2 10/20/21 1038 98 %     Weight --      Height --      Head Circumference --      Peak Flow --      Pain Score 10/20/21 1039 4     Pain Loc  --      Pain Edu? --      Excl. in [place]? --    No data found.  Updated Vital Signs BP 124/74 (BP Location: Left Arm)    Pulse 80    Temp 98.9 F (37.2 C) (Oral)    Resp 18    LMP 10/09/2021    SpO2 98%   Visual Acuity Right Eye  Distance:   Left Eye Distance:   Bilateral Distance:    Right Eye Near:   Left Eye Near:    Bilateral Near:     Physical Exam Vitals reviewed.  Constitutional:      General: She is not in acute distress.    Appearance: Normal appearance. She is not ill-appearing.  HENT:     Head: Normocephalic and atraumatic.     Right Ear: Ear canal and external ear normal. No tenderness. There is impacted cerumen.     Left Ear: Ear canal and external ear normal. No tenderness. There is impacted cerumen.     Ears:     Comments: Unable to visualize TMs given cerumen impaction.    Nose: Nose normal. No congestion.     Mouth/Throat:     Mouth: Mucous membranes are moist.     Pharynx: Uvula midline. No oropharyngeal exudate or posterior oropharyngeal erythema.     Comments: Moist mucous membranes Eyes:     Extraocular Movements: Extraocular movements intact.     Pupils: Pupils are equal, round, and reactive to light.  Cardiovascular:     Rate and Rhythm: Normal rate and regular rhythm.     Heart sounds: Normal heart sounds.  Pulmonary:     Effort: Pulmonary effort is normal.     Breath sounds: Normal breath sounds. No decreased breath sounds, wheezing, rhonchi or rales.  Abdominal:     General: Bowel sounds are normal. There is no distension.     Palpations: Abdomen is soft. There is no mass.     Tenderness: There is no abdominal tenderness. There is no right CVA tenderness, left CVA tenderness, guarding or rebound.  Genitourinary:    Comments: deferred Lymphadenopathy:     Cervical: No cervical adenopathy.     Right cervical: No superficial cervical adenopathy.    Left cervical: No superficial cervical adenopathy.  Skin:    General: Skin is warm.      Capillary Refill: Capillary refill takes less than 2 seconds.     Comments: Good skin turgor  Neurological:     General: No focal deficit present.     Mental Status: She is alert and oriented to person, place, and time.  Psychiatric:        Mood and Affect: Mood normal.        Behavior: Behavior normal.        Thought Content: Thought content normal.        Judgment: Judgment normal.     UC Treatments / Results  Labs (all labs ordered are listed, but only abnormal results are displayed) Labs Reviewed  CERVICOVAGINAL ANCILLARY ONLY    EKG   Radiology No results found.  Procedures Procedures (including critical care time)  Medications Ordered in UC Medications - No data to display  Initial Impression / Assessment and Plan / UC Course  I have reviewed the triage vital signs and the nursing notes.  Pertinent labs & imaging results that were available during my care of the patient were reviewed by me and considered in my medical decision making (see chart for details).     This patient is a very pleasant 36 y.o. year old female presenting with cerumen impaction, vaginal irritation and covid-19. Afebrile, nontachy.   Positive home covid test on 10/17/21. She has impacted cerumen bilaterally, suspect this is the cause of her ear pressure. Her TMs are fully occluded by cerumen at visit and I am unable to determine if AOM is present.  She declines cerumen removal or lavage at this visit (stating this has given her otitis externa in the past), preferring to treat with with abx and f/u with ENT if symptoms persist. Amoxicillin sent.   LMP 10/09/21, States she is not pregnant or breastfeeding. Denies STI risk. Suspect vaginal irritation is related to soap and not vaginitis. Will send self-swab for G/C, trich, yeast, BV testing. Declines HIV, RPR. Safe sex precautions. She can try Monistat OTC for symptomatic relief.  ED return precautions discussed. Patient verbalizes understanding and  agreement.     Final Clinical Impressions(s) / UC Diagnoses   Final diagnoses:  COVID-19  Pressure sensation in both ears  Vaginitis and vulvovaginitis  Bilateral impacted cerumen  History of ear infections     Discharge Instructions      -Amoxicillin twice daily x7 days -Follow-up with ENT if symptoms persist  -We'll call in 2-3 days with results of your vaginal swab, we can send medications if necessary     ED Prescriptions     Medication Sig Dispense Auth. Provider   amoxicillin (AMOXIL) 875 MG tablet Take 1 tablet (875 mg total) by mouth 2 (two) times daily for 7 days. 14 tablet Hazel Sams, PA-C      PDMP not reviewed this encounter.   Hazel Sams, PA-C 10/20/21 1123

## 2021-10-20 NOTE — ED Triage Notes (Signed)
Pt states had a positive home covid test on Saturday. States self treating sx's with OTC meds. States still having lt ear pain.   Pt c/o vaginal irritation after using soap to perineum area 2 days ago.

## 2021-10-21 ENCOUNTER — Telehealth (HOSPITAL_COMMUNITY): Payer: Self-pay | Admitting: Emergency Medicine

## 2021-10-21 LAB — CERVICOVAGINAL ANCILLARY ONLY
Bacterial Vaginitis (gardnerella): NEGATIVE
Candida Glabrata: NEGATIVE
Candida Vaginitis: POSITIVE — AB
Chlamydia: NEGATIVE
Comment: NEGATIVE
Comment: NEGATIVE
Comment: NEGATIVE
Comment: NEGATIVE
Comment: NEGATIVE
Comment: NORMAL
Neisseria Gonorrhea: NEGATIVE
Trichomonas: NEGATIVE

## 2021-10-21 MED ORDER — FLUCONAZOLE 150 MG PO TABS: 150.0000 mg | ORAL_TABLET | Freq: Once | ORAL | 0 refills | Status: AC

## 2021-10-28 ENCOUNTER — Encounter: Payer: Self-pay | Admitting: Family Medicine

## 2021-10-28 ENCOUNTER — Ambulatory Visit (INDEPENDENT_AMBULATORY_CARE_PROVIDER_SITE_OTHER): Payer: Medicaid Other | Admitting: Family Medicine

## 2021-10-28 VITALS — BP 126/80 | HR 68 | Resp 16 | Ht 65.0 in | Wt 179.5 lb

## 2021-10-28 DIAGNOSIS — B3731 Acute candidiasis of vulva and vagina: Secondary | ICD-10-CM | POA: Diagnosis not present

## 2021-10-28 DIAGNOSIS — R1030 Lower abdominal pain, unspecified: Secondary | ICD-10-CM

## 2021-10-28 DIAGNOSIS — M545 Low back pain, unspecified: Secondary | ICD-10-CM | POA: Diagnosis not present

## 2021-10-28 DIAGNOSIS — Z131 Encounter for screening for diabetes mellitus: Secondary | ICD-10-CM | POA: Diagnosis not present

## 2021-10-28 LAB — POCT GLYCOSYLATED HEMOGLOBIN (HGB A1C): Hemoglobin A1C: 5.5 % (ref 4.0–5.6)

## 2021-10-28 LAB — POCT URINALYSIS DIPSTICK
Bilirubin, UA: NEGATIVE
Blood, UA: NEGATIVE
Glucose, UA: NEGATIVE
Ketones, UA: NEGATIVE
Leukocytes, UA: NEGATIVE
Nitrite, UA: NEGATIVE
Protein, UA: NEGATIVE
Spec Grav, UA: 1.025 (ref 1.010–1.025)
Urobilinogen, UA: 0.2 E.U./dL
pH, UA: 6 (ref 5.0–8.0)

## 2021-10-28 MED ORDER — TERCONAZOLE 0.4 % VA CREA: 1.0000 | TOPICAL_CREAM | Freq: Every day | VAGINAL | 0 refills | Status: DC

## 2021-10-28 MED ORDER — FLUCONAZOLE 150 MG PO TABS: 150.0000 mg | ORAL_TABLET | ORAL | 0 refills | Status: DC

## 2021-10-28 NOTE — Patient Instructions (Addendum)
A few things to remember from today's visit:   Lower abdominal pain - Plan: POCT urinalysis dipstick, Urine Culture, Urine Culture  Candida vaginitis - Plan: fluconazole (DIFLUCAN) 150 MG tablet, terconazole (TERAZOL 7) 0.4 % vaginal cream  Bilateral low back pain without sciatica, unspecified chronicity  Diabetes mellitus screening - Plan: POC HgB A1c  If you need refills please call your pharmacy. Do not use My Chart to request refills or for acute issues that need immediate attention.   Lower back pain seems to be musculoskeletal. In 6 days if still having vaginal itching and irritation like symptoms take another diflucan. Start topical treatment with vaginal cream.  Please be sure medication list is accurate. If a new problem present, please set up appointment sooner than planned today.  If problem is persistent , will need gyn evaluation.

## 2021-10-28 NOTE — Progress Notes (Signed)
ACUTE VISIT Chief Complaint  Patient presents with   Abdominal Pain   HPI: Veronica Raymond is a 36 y.o. female with hx of vit D def,sjogren's disease,migraine headache,polyarthralgia,and GERD here today complaining of lower abdominal pain, which she thinks may be caused by a vaginal yeast infection or UTI.  Vaginal pain radiated to lower abdomen for the past 4 days. She had a pelvic exam done in the ED on 10/20/21 due to "vaginal irritation." STD screening negative. Cervicovaginal  test was positive for candida vaginitis she was treated with Diflucan 150 mg x 2, has not helped. Problem started earlier 09/2021 and aggravated by oral abx treatment, started for left ear infection. She completed 5 days of Amoxicillin 875 mg Left earache improved. Took 2nd dose of Diflucan yesterday.  LMP  10/09/2021.  No urinary symptoms. Lower back pain, which is different to her chronic back pain. + Nausea, ascribed to abx.  Denies fever,chills,changes in bowel habits,vomiting,or skin rash.  She is concerned about abnormal glucose/DM II. Negative for polydipsia,polyuria, or polyphagia.  Lab Results  Component Value Date   HGBA1C 5.5 08/16/2019   Review of Systems  Constitutional:  Positive for fatigue. Negative for activity change and appetite change.  HENT:  Negative for mouth sores, nosebleeds and sore throat.   Respiratory:  Negative for cough, shortness of breath and wheezing.   Cardiovascular:  Negative for chest pain and leg swelling.  Genitourinary:  Negative for decreased urine volume, difficulty urinating, dysuria and hematuria.  Musculoskeletal:  Negative for gait problem.  Neurological:  Negative for syncope and weakness.  Psychiatric/Behavioral:  The patient is nervous/anxious.   Rest see pertinent positives and negatives per HPI.  Current Outpatient Medications on File Prior to Visit  Medication Sig Dispense Refill   cetirizine (ZYRTEC ALLERGY) 10 MG tablet Take 1 tablet  (10 mg total) by mouth daily. 30 tablet 1   fluticasone (FLONASE) 50 MCG/ACT nasal spray SHAKE LIQUID AND USE 1 SPRAY IN EACH NOSTRIL DAILY 16 g 0   Multiple Vitamin (MULTIVITAMIN) tablet Take 1 tablet by mouth daily.     naproxen (NAPROSYN) 500 MG tablet Take 1 tablet (500 mg total) by mouth 2 (two) times daily with a meal. 30 tablet 0   nortriptyline (PAMELOR) 10 MG capsule Take 1 capsule (10 mg total) by mouth at bedtime. 90 capsule 1   pantoprazole (PROTONIX) 40 MG tablet Take 1 tablet (40 mg total) by mouth daily. 90 tablet 0   tiZANidine (ZANAFLEX) 4 MG tablet Take 1 tablet (4 mg total) by mouth at bedtime. 30 tablet 0   No current facility-administered medications on file prior to visit.   Past Medical History:  Diagnosis Date   Allergic rhinitis    Anxiety    Bartholin gland cyst 05/04/2013   GERD (gastroesophageal reflux disease)    Migraine without aura, without mention of intractable migraine without mention of status migrainosus 07/09/2013   Ovarian cyst 2014   Polyarthralgia    Sjogren's syndrome ([place])    Vaginal Pap smear, abnormal    Allergies  Allergen Reactions   Decongestant [Pseudoephedrine] Other (See Comments)    SI    Iodinated Contrast Media Hives   Percocet [Oxycodone-Acetaminophen] Itching   Iohexol Hives and Itching   Oxycodone-Acetaminophen Other (See Comments)   Sulfa Antibiotics Hives and Itching    Social History   Socioeconomic History   Marital status: Significant Other    Spouse name: Not on file   Number of children: 3  Years of education: college   Highest education level: Not on file  Occupational History   Occupation: CMA    Employer: [employer]    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[employer]   Smoking status: Never   Smokeless tobacco: Never  Vaping Use   Vaping Use: Never used  Substance and Sexual Activity   Alcohol use: Yes    Comment: rarely   Drug use: No   Sexual activity: Not on file  Other Topics Concern   Not on  file  Social History Narrative   Lives with her 4 children      Lives in two story home      Right handed      Highest level of edu- [employer]      Social Determinants of Health   Financial Resource Strain: Not on file  Food Insecurity: Not on file  Transportation Needs: Not on file  Physical Activity: Not on file  Stress: Not on file  Social Connections: Not on file   Vitals:   10/28/21 1425  BP: 126/80  Pulse: 68  Resp: 16  SpO2: 99%   Body mass index is 29.87 kg/m.  Physical Exam Vitals and nursing note reviewed.  Constitutional:      General: She is not in acute distress.    Appearance: She is well-developed.  HENT:     Head: Normocephalic and atraumatic.     Right Ear: External ear normal.     Left Ear: External ear normal. Tympanic membrane is not erythematous.     Ears:     Comments: Left TM seen partially. Right ear canal cerumen excess, could not see TM.    Mouth/Throat:     Mouth: Mucous membranes are moist.  Eyes:     Conjunctiva/sclera: Conjunctivae normal.  Cardiovascular:     Rate and Rhythm: Normal rate and regular rhythm.     Heart sounds: No murmur heard. Pulmonary:     Effort: Pulmonary effort is normal. No respiratory distress.     Breath sounds: Normal breath sounds.  Abdominal:     Palpations: Abdomen is soft. There is no hepatomegaly or mass.     Tenderness: There is abdominal tenderness in the right lower quadrant, periumbilical area, suprapubic area and left lower quadrant. There is no right CVA tenderness, left CVA tenderness, guarding or rebound.  Genitourinary:    Comments: Deferred to gyn or next visit if needed. Musculoskeletal:       Back:  Skin:    General: Skin is warm.     Findings: No erythema or rash.  Neurological:     General: No focal deficit present.     Mental Status: She is alert and oriented to person, place, and time.     Cranial Nerves: No cranial nerve deficit.     Gait: Gait normal.  Psychiatric:         Mood and Affect: Mood is anxious.     Comments: Well groomed, good eye contact.   ASSESSMENT AND PLAN:  Veronica Raymond was seen today for abdominal pain.  Diagnoses and all orders for this visit: Orders Placed This Encounter  Procedures   Urine Culture   POCT urinalysis dipstick   POC HgB A1c   Lab Results  Component Value Date   HGBA1C 5.5 10/28/2021   Lower abdominal pain We discussed possible etiologies. She has had abdominal pain before, CT in 08/2018 done because generalized abdominal pain+N/V, "benign-appearing abdomen and pelvis.  Abdomina US 07/2021 because  nausea and diarrhea and abdominal MRI 10/05/2021 negative except for benign hepatic hemangioma. No signs of acute abdomen on examination today, hx does not suggest a serious process or UTI, no urinary symptoms. Urine dipstick negative, concerned about UTI, so urine sent for cx. I do not think imaging is needed at this time. Instructed about warning signs.  Candida vaginitis We discussed Dx and treatment options. She took a 2nd Diflucan yesterday, in 7 days if still having vaginal "irritation", she can take another one and repeat a week later. We discussed some side effects of medication. If symptoms are persistent, recommend following with gyn.  -     fluconazole (DIFLUCAN) 150 MG tablet; Take 1 tablet (150 mg total) by mouth once a week. -     terconazole (TERAZOL 7) 0.4 % vaginal cream; Place 1 applicator vaginally at bedtime.  Bilateral low back pain without sciatica, unspecified chronicity It seems to be musculoskeletal. I do not think imaging is needed today. She has been prescribed Zanaflex in 09/2021. F/U as needed.  Diabetes mellitus screening -     POC HgB A1c  Return if symptoms worsen or fail to improve.  Stephen Baruch G. Martinique, MD  [facility]. [place] office.

## 2021-10-29 LAB — URINE CULTURE
MICRO NUMBER:: 12949259
Result:: NO GROWTH
SPECIMEN QUALITY:: ADEQUATE

## 2021-11-01 ENCOUNTER — Other Ambulatory Visit: Payer: Self-pay | Admitting: Family Medicine

## 2021-11-01 DIAGNOSIS — K219 Gastro-esophageal reflux disease without esophagitis: Secondary | ICD-10-CM

## 2021-12-15 ENCOUNTER — Other Ambulatory Visit: Payer: Self-pay | Admitting: Adult Health

## 2021-12-15 MED ORDER — CYCLOBENZAPRINE HCL 10 MG PO TABS: 10.0000 mg | ORAL_TABLET | Freq: Three times a day (TID) | ORAL | 0 refills | Status: DC | PRN

## 2021-12-15 NOTE — Progress Notes (Signed)
Patient with oblique strain while working out. Will send in Flexeril 10 mg QHS PRN ?

## 2022-01-11 ENCOUNTER — Ambulatory Visit (INDEPENDENT_AMBULATORY_CARE_PROVIDER_SITE_OTHER): Payer: Medicaid Other | Admitting: Gastroenterology

## 2022-01-11 ENCOUNTER — Encounter: Payer: Self-pay | Admitting: Gastroenterology

## 2022-01-11 VITALS — BP 120/60 | HR 50 | Ht 64.75 in | Wt 175.4 lb

## 2022-01-11 DIAGNOSIS — R1012 Left upper quadrant pain: Secondary | ICD-10-CM

## 2022-01-11 DIAGNOSIS — R110 Nausea: Secondary | ICD-10-CM | POA: Diagnosis not present

## 2022-01-11 NOTE — Patient Instructions (Addendum)
If you are age 35 or younger, your body mass index should be between 19-25. Your Body mass index is 29.41 kg/m?Marland Kitchen If this is out of the aformentioned range listed, please consider follow up with your Primary Care Provider.  ?________________________________________________________ ? ?The [facility] providers would like to encourage you to use [facility] to communicate with providers for non-urgent requests or questions.  Due to long hold times on the telephone, sending your provider a message by [facility] may be a faster and more efficient way to get a response.  Please allow 48 business hours for a response.  Please remember that this is for non-urgent requests.  ?_______________________________________________________ ? ?It has been recommended to you by your physician that you have a(n) Endoscopy completed. Per your request, we did not schedule the procedure(s) today. Please contact our office at [phone] should you decide to have the procedure completed. You will be scheduled for a pre-visit and procedure at that time. ? ?It was a pleasure to see you today! ? ?Thank you for trusting me with your gastrointestinal care!   ? ? ?

## 2022-01-11 NOTE — Progress Notes (Signed)
? ? ?Veronica Raymond: ? ?History: ?Veronica Raymond ?01/11/2022 ? ?Referring provider: Martinique, Betty G, MD ? ?Reason for consult/chief complaint: Abdominal Pain (Still having LUQ pain ) ? ? ?Subjective  ?HPI: ?Veronica Raymond was seen here once in May 2019 for years of left upper quadrant/flank pain that was consistent with a musculoskeletal cause.  She also had intermittent reflux symptoms. ? ?From primary care Raymond 10/28/2021: ?"Lower abdominal pain ?We discussed possible etiologies. ?She has had abdominal pain before, CT in 08/2018 done because generalized abdominal pain+N/V, "benign-appearing abdomen and pelvis.  ?Abdomina US 07/2021 because nausea and diarrhea and abdominal MRI 10/05/2021 negative except for benign hepatic hemangioma. ?No signs of acute abdomen on examination today, hx does not suggest a serious process or UTI, no urinary symptoms. ?Urine dipstick negative, concerned about UTI, so urine sent for cx. ?I do not think imaging is needed at this time. ?Instructed about warning signs." ? ? ?___________________________ ? ?Veronica Raymond still has left lower chest/LUQ pain that is difficult to characterize.  Not necessarily worse after meals, though sometimes with position.  No respirophasic component, no shortness of breath or cough.  Occasional nausea which she thinks may be reflux and for which she takes pantoprazole with uncertain result. ?No relief from lidocaine patches. ?Pain is sometimes felt as a twisting sensation. ?ROS: ? ?Review of Systems  ?Constitutional:  Negative for appetite change and unexpected weight change.  ?HENT:  Negative for mouth sores and voice change.   ?Eyes:  Negative for pain and redness.  ?Respiratory:  Negative for cough and shortness of breath.   ?Cardiovascular:  Negative for chest pain and palpitations.  ?Genitourinary:  Negative for dysuria and hematuria.  ?Musculoskeletal:  Negative for arthralgias and myalgias.  ?Skin:  Negative for pallor and rash.   ?Neurological:  Negative for weakness and headaches.  ?Hematological:  Negative for adenopathy.  ? ? ?Past Medical History: ?Past Medical History:  ?Diagnosis Date  ? Allergic rhinitis   ? Anxiety   ? Bartholin gland cyst 05/04/2013  ? GERD (gastroesophageal reflux disease)   ? Migraine without aura, without mention of intractable migraine without mention of status migrainosus 07/09/2013  ? Ovarian cyst 2014  ? Polyarthralgia   ? Sjogren's syndrome ([place])   ? Vaginal Pap smear, abnormal   ? ? ? ?Past Surgical History: ?Past Surgical History:  ?Procedure Laterality Date  ? ADENOIDECTOMY    ? ESSURE TUBAL LIGATION  07-05-2014  ? THERAPEUTIC ABORTION    ? TONSILLECTOMY    ? TYMPANOSTOMY TUBE PLACEMENT    ? ? ? ?Family History: ?Family History  ?Problem Relation Age of Onset  ? Hypertension Mother   ? Sleep apnea Mother   ? Obesity Mother   ? Heart disease Father   ? Alcohol abuse Father   ?     dying from this  ? Diabetes Father   ? High blood pressure Father   ? Anxiety disorder Father   ? AAA (abdominal aortic aneurysm) Father   ? Mental retardation Maternal Uncle   ? Diabetes Maternal Grandmother   ? Hypertension Maternal Grandmother   ? Kidney disease Maternal Grandmother   ? Heart disease Maternal Grandmother   ? Stroke Maternal Grandmother   ? Prostate cancer Maternal Grandfather   ? Alcohol abuse Other   ? Hypertension Paternal Grandmother   ? ? ?Social History: ?Social History  ? ?Socioeconomic History  ? Marital status: Significant Other  ?  Spouse name: Not on file  ? Number of  children: 3  ? Years of education: college  ? Highest education level: Not on file  ?Occupational History  ? Occupation: CMA  ?  Employer: [employer]  ?  Comment: [place] at [employer]  ?Tobacco Use  ? Smoking status: Never  ? Smokeless tobacco: Never  ?Vaping Use  ? Vaping Use: Never used  ?Substance and Sexual Activity  ? Alcohol use: Yes  ?  Comment: rarely  ? Drug use: No  ? Sexual activity: Not on file  ?Other Topics Concern  ?  Not on file  ?Social History Narrative  ? Lives with her 4 children  ?   ? Lives in two story home  ?   ? Right handed  ?   ? Highest level of edu- [employer]  ?   ? ?Social Determinants of Health  ? ?Financial Resource Strain: Not on file  ?Food Insecurity: Not on file  ?Transportation Needs: Not on file  ?Physical Activity: Not on file  ?Stress: Not on file  ?Social Connections: Not on file  ? ? ?Allergies: ?Allergies  ?Allergen Reactions  ? Decongestant [Pseudoephedrine] Other (See Comments)  ?  SI   ? Iodinated Contrast Media Hives  ? Percocet [Oxycodone-Acetaminophen] Itching  ? Flonase [Fluticasone]   ?  Triggers migraines  ? Iohexol Hives and Itching  ? Oxycodone-Acetaminophen Other (See Comments)  ? Sulfa Antibiotics Hives and Itching  ? ? ?Outpatient Meds: ?Current Outpatient Medications  ?Medication Sig Dispense Refill  ? cetirizine (ZYRTEC ALLERGY) 10 MG tablet Take 1 tablet (10 mg total) by mouth daily. 30 tablet 1  ? cyclobenzaprine (FLEXERIL) 10 MG tablet Take 1 tablet (10 mg total) by mouth 3 (three) times daily as needed for muscle spasms. 30 tablet 0  ? Multiple Vitamin (MULTIVITAMIN) tablet Take 1 tablet by mouth daily.    ? pantoprazole (PROTONIX) 40 MG tablet TAKE 1 TABLET(40 MG) BY MOUTH DAILY 90 tablet 2  ? nortriptyline (PAMELOR) 10 MG capsule Take 1 capsule (10 mg total) by mouth at bedtime. (Patient not taking: Reported on 01/11/2022) 90 capsule 1  ? ?No current facility-administered medications for this visit.  ? ? ? ? ?___________________________________________________________________ ?Objective  ? ?Exam: ? ?BP 120/60 (BP Location: Left Arm, Patient Position: Sitting, Cuff Size: Normal)   Pulse (!) 50   Ht 5' 4.75" (1.645 m) Comment: measured without shoes  Wt 175 lb 6 oz (79.5 kg)   BMI 29.41 kg/m?  ?Wt Readings from Last 3 Encounters:  ?01/11/22 175 lb 6 oz (79.5 kg)  ?10/28/21 179 lb 8 oz (81.4 kg)  ?08/05/21 181 lb (82.1 kg)  ? ? ?General: Well-appearing ?Eyes: sclera anicteric, no  redness ?ENT: oral mucosa moist without lesions, no cervical or supraclavicular lymphadenopathy ?CV: RRR without murmur, S1/S2, no JVD, no peripheral edema ?Resp: clear to auscultation bilaterally, normal RR and effort noted ?GI: soft, tear lower costal margin tenderness, with active bowel sounds. No guarding or palpable organomegaly noted. ?Skin; warm and dry, no rash or jaundice noted ?Neuro: awake, alert and oriented x 3. Normal gross motor function and fluent speech ? ?Labs: ? ?Radiologic Studies: ? ?Right upper quadrant ultrasound November 2022, no gallstones.  Liver lesion seen, follow-up MRI confirmed small hemangioma. ? ?Assessment: ?Encounter Diagnoses  ?Name Primary?  ? LUQ pain Yes  ? Nausea in adult   ? ? ?This pain is still not clearly digestive in nature.  She has some nausea as well, it has been going on 6 years and further investigation is reasonable  to see if there is an identifiable UGI cause.  I recommended upper endoscopy and she was agreeable after discussion of procedure and risks. ? The benefits and risks of the planned procedure were described in detail with the patient or (when appropriate) their health care proxy.  Risks were outlined as including, but not limited to, bleeding, infection, perforation, adverse medication reaction leading to cardiac or pulmonary decompensation, pancreatitis (if ERCP).  The limitation of incomplete mucosal visualization was also discussed.  No guarantees or warranties were given. ? ? ?Thank you for the courtesy of this consult.  Please call me with any questions or concerns. ? ?Nelida Meuse III ? ?CC: Referring provider noted above ? ?

## 2022-03-01 ENCOUNTER — Other Ambulatory Visit (HOSPITAL_COMMUNITY)
Admission: RE | Admit: 2022-03-01 | Discharge: 2022-03-01 | Disposition: A | Discharge status: Home / Self Care | Payer: No Typology Code available for payment source | Source: Ambulatory Visit | Attending: Family Medicine | Admitting: Family Medicine

## 2022-03-01 ENCOUNTER — Ambulatory Visit (INDEPENDENT_AMBULATORY_CARE_PROVIDER_SITE_OTHER): Payer: No Typology Code available for payment source | Admitting: Family Medicine

## 2022-03-01 ENCOUNTER — Encounter: Payer: Self-pay | Admitting: Family Medicine

## 2022-03-01 VITALS — BP 120/70 | HR 73 | Resp 16 | Ht 64.75 in | Wt 170.1 lb

## 2022-03-01 DIAGNOSIS — R109 Unspecified abdominal pain: Secondary | ICD-10-CM

## 2022-03-01 DIAGNOSIS — N898 Other specified noninflammatory disorders of vagina: Secondary | ICD-10-CM

## 2022-03-01 DIAGNOSIS — M549 Dorsalgia, unspecified: Secondary | ICD-10-CM

## 2022-03-01 DIAGNOSIS — R1030 Lower abdominal pain, unspecified: Secondary | ICD-10-CM

## 2022-03-01 NOTE — Patient Instructions (Addendum)
A few things to remember from today's visit:  Lower abdominal pain  Left flank pain - Plan: Urinalysis, Routine w reflex microscopic  Vaginal discharge - Plan: Cervicovaginal ancillary only  If you need refills please call your pharmacy. Do not use My Chart to request refills or for acute issues that need immediate attention.   I think we can hold on abdominal imaging for now. If pelvic pain is persistent after menstrual period we may need a pelvic ultrasound. Try to arrange endoscopy as recommended by gastroenterologist.  Please be sure medication list is accurate. If a new problem present, please set up appointment sooner than planned today.

## 2022-03-01 NOTE — Progress Notes (Signed)
ACUTE VISIT Chief Complaint  Patient presents with   Abdominal Cramping    X 2 weeks, started at her pelvis and has now gone up to under her ribs on the left side; nausea, feeling full.   HPI: [name] is a 36 y.o. female sith hx of fibromyalgia, GERD, Sjogren's disease,abnormal pap smear,and headaches today complaining of lower abdomen pain radiated to upper abdomen. 2 days ago she started with bilateral mid back pain and now having left flank pain. No hx of trauma or unusual physical activity. She has not noted skin rash.  Abdominal Pain The current episode started 1 to 4 weeks ago. The onset quality is gradual. The problem occurs constantly. The problem has been unchanged. The pain is located in the left flank, LLQ and RLQ. The pain is moderate. The quality of the pain is sharp. The abdominal pain radiates to the LUQ and RUQ. Associated symptoms include diarrhea and nausea. Pertinent negatives include no arthralgias, constipation, dysuria, fever, flatus, frequency, headaches, hematochezia, hematuria, melena, vomiting or weight loss. Nothing aggravates the pain. Prior diagnostic workup includes GI consult and ultrasound.  She has not identified exacerbating or alleviating factors. Saturday she felt "full", severe nausea and decreased appetite x 1 d. She got a rx for Zofran 4 mg a few days ago. + "Gassy" and diarrhea. Last bowel movement yesterday, formed.  07/2021 abdominal US was ordered because nausea,abdominal pain,and diarrhea. MRI 10/05/2021 negative except for benign hepatic hemangioma Abdominal/pelvic CT in 08/2018 because abdominal pain,nausea,ans vomiting:Benign-appearing abdomen and pelvis.  She has seen GI because LUQ pain, 01/11/22. EGD was recommended. Negative for dysuria,increased urinary frequency, gross hematuria,or decreased urine output. No hx of nephrolithiasis.  Vaginal discharge 4 days,whitish, no odor but it is more than her usual. She is concerned  about STD and/or yeast infection. She has not had pruritus or abnormal vaginal bleeding. Negative for dyspareunia.  LMP 01/30/22. Slp BTL. Sexually active, 2 months ago condom ruptured.  Review of Systems  Constitutional:  Negative for fever and weight loss.  Respiratory:  Negative for cough, shortness of breath and wheezing.   Cardiovascular:  Negative for chest pain.  Gastrointestinal:  Positive for abdominal pain, diarrhea and nausea. Negative for constipation, flatus, hematochezia, melena and vomiting.  Genitourinary:  Negative for dysuria, frequency and hematuria.  Musculoskeletal:  Negative for arthralgias and gait problem.  Neurological:  Negative for syncope, weakness and headaches.  Hematological:  Negative for adenopathy. Does not bruise/bleed easily.  Rest see pertinent positives and negatives per HPI.  Current Outpatient Medications on File Prior to Visit  Medication Sig Dispense Refill   cetirizine (ZYRTEC ALLERGY) 10 MG tablet Take 1 tablet (10 mg total) by mouth daily. 30 tablet 1   cyclobenzaprine (FLEXERIL) 10 MG tablet Take 1 tablet (10 mg total) by mouth 3 (three) times daily as needed for muscle spasms. 30 tablet 0   Multiple Vitamin (MULTIVITAMIN) tablet Take 1 tablet by mouth daily.     No current facility-administered medications on file prior to visit.   Past Medical History:  Diagnosis Date   Allergic rhinitis    Anxiety    Bartholin gland cyst 05/04/2013   GERD (gastroesophageal reflux disease)    Migraine without aura, without mention of intractable migraine without mention of status migrainosus 07/09/2013   Ovarian cyst 2014   Polyarthralgia    Sjogren's syndrome ([place])    Vaginal Pap smear, abnormal    Allergies  Allergen Reactions   Decongestant [Pseudoephedrine] Other (See Comments)  SI    Iodinated Contrast Media Hives   Percocet [Oxycodone-Acetaminophen] Itching   Flonase [Fluticasone]     Triggers migraines   Iohexol Hives and Itching    Oxycodone-Acetaminophen Other (See Comments)   Sulfa Antibiotics Hives and Itching   Social History   Socioeconomic History   Marital status: Significant Other    Spouse name: Not on file   Number of children: 3   Years of education: college   Highest education level: Not on file  Occupational History   Occupation: CMA    Employer: [employer]    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[employer]  Tobacco Use   Smoking status: Never   Smokeless tobacco: Never  Vaping Use   Vaping Use: Never used  Substance and Sexual Activity   Alcohol use: Yes    Comment: rarely   Drug use: No   Sexual activity: Not on file  Other Topics Concern   Not on file  Social History Narrative   Lives with her 4 children      Lives in two story home      Right handed      Highest level of edu- [employer]      Social Determinants of Health   Financial Resource Strain: Not on file  Food Insecurity: Not on file  Transportation Needs: Not on file  Physical Activity: Not on file  Stress: Not on file  Social Connections: Not on file   Vitals:   03/01/22 1522  BP: 120/70  Pulse: 73  Resp: 16  SpO2: 99%   Body mass index is 28.53 kg/m.  Physical Exam Vitals and nursing note reviewed.  Constitutional:      General: She is not in acute distress.    Appearance: She is well-developed.  HENT:     Head: Normocephalic and atraumatic.     Mouth/Throat:     Mouth: Mucous membranes are moist.     Pharynx: Oropharynx is clear.  Eyes:     Conjunctiva/sclera: Conjunctivae normal.  Cardiovascular:     Rate and Rhythm: Normal rate and regular rhythm.     Heart sounds: No murmur heard. Pulmonary:     Effort: Pulmonary effort is normal. No respiratory distress.     Breath sounds: Normal breath sounds.  Abdominal:     Palpations: Abdomen is soft. There is no hepatomegaly or mass.     Tenderness: There is abdominal tenderness in the suprapubic area and left lower quadrant. There is no right CVA tenderness, left  CVA tenderness, guarding or rebound.  Genitourinary:    Comments: Deferred to gyn. Musculoskeletal:     Thoracic back: Tenderness present. No bony tenderness.       Back:  Lymphadenopathy:     Cervical: No cervical adenopathy.  Skin:    General: Skin is warm.     Findings: No erythema or rash.  Neurological:     General: No focal deficit present.     Mental Status: She is alert and oriented to person, place, and time.     Gait: Gait normal.  Psychiatric:     Comments: Well groomed, good eye contact.   ASSESSMENT AND PLAN:  [name] was seen today for abdominal cramping.  Diagnoses and all orders for this visit: Orders Placed This Encounter  Procedures   Urinalysis, Routine w reflex microscopic   Lower abdominal pain Radiated to upper abdomen. We discussed possible etiologies. GI and gyn processes in the differential Dx. IBS and dysmenorrhea among some to consider.  She has had lower abdominal pain before, continue following with GI. Examination today does not suggest a serious process, I think we can hold on imaging for now. Pregnancy test negative.  Instructed about warning signs.  Left flank pain Could be musculoskeletal. UA ordered today, further recommendations will be given accordingly.  Vaginal discharge She has been treated for yeast infection in the past, concerned about recurrence. Lab Results  Component Value Date   HGBA1C 5.5 10/28/2021  ? Physiologic discharge. Further recommendations according to lab result. If persistent she needs to arrange appt with gyn.  Upper back pain Hx and examination suggest muscular pain. I do not think imaging is needed today. Monitor for new symptoms. She can take Ibuprofen 200 mg 2 tabs with food up to 3 times per day prn and/or Acetaminophen.  I spent a total of 33 minutes in both face to face and non face to face activities for this visit on the date of this encounter. During this time history was obtained and  documented, examination was performed, prior labs/imaging reviewed, and assessment/plan discussed.  Return if symptoms worsen or fail to improve.  Amayra Kiedrowski G. [place], MD  [facility]. [place] office.

## 2022-03-02 LAB — URINALYSIS, ROUTINE W REFLEX MICROSCOPIC
Bilirubin Urine: NEGATIVE
Hgb urine dipstick: NEGATIVE
Ketones, ur: NEGATIVE
Leukocytes,Ua: NEGATIVE
Nitrite: NEGATIVE
Specific Gravity, Urine: 1.02 (ref 1.000–1.030)
Total Protein, Urine: NEGATIVE
Urine Glucose: NEGATIVE
Urobilinogen, UA: 0.2 (ref 0.0–1.0)
pH: 7 (ref 5.0–8.0)

## 2022-03-02 LAB — POCT URINE PREGNANCY: Preg Test, Ur: NEGATIVE

## 2022-03-02 NOTE — Addendum Note (Signed)
Addended by: Rodrigo Ran on: 03/02/2022 07:02 AM   Modules accepted: Orders

## 2022-03-03 ENCOUNTER — Other Ambulatory Visit: Payer: Self-pay | Admitting: Family Medicine

## 2022-03-03 LAB — CERVICOVAGINAL ANCILLARY ONLY
Bacterial Vaginitis (gardnerella): POSITIVE — AB
Candida Glabrata: NEGATIVE
Candida Vaginitis: NEGATIVE
Chlamydia: NEGATIVE
Comment: NEGATIVE
Comment: NEGATIVE
Comment: NEGATIVE
Comment: NEGATIVE
Comment: NEGATIVE
Comment: NORMAL
Neisseria Gonorrhea: NEGATIVE
Trichomonas: NEGATIVE

## 2022-03-03 MED ORDER — METRONIDAZOLE 0.75 % VA GEL: 1.0000 | Freq: Two times a day (BID) | VAGINAL | 0 refills | Status: AC

## 2022-03-12 ENCOUNTER — Telehealth: Payer: Self-pay | Admitting: Gastroenterology

## 2022-03-12 NOTE — Telephone Encounter (Signed)
This patient saw Dr. Loletha Carrow 4/17 and they discussed patient setting up an EGD.  She said she needed to wait a bit and now she is ready to schedule.  Does she need to come back in or can she be scheduled directly for the procedure?  Please advise.  Thank you.

## 2022-03-12 NOTE — Telephone Encounter (Signed)
Dr. Loletha Carrow please advise if patient should come back in for a follow up for reassessment of symptoms.

## 2022-03-12 NOTE — Telephone Encounter (Signed)
She can be directly booked for an EGD with me in the [facility].  Indication is left upper quadrant pain, nausea  HD

## 2022-03-15 NOTE — Telephone Encounter (Signed)
Patient has been scheduled for EGD on 02-2022. She will come to the office to pick up instructions and sign the consent forms.

## 2022-04-01 ENCOUNTER — Ambulatory Visit (AMBULATORY_SURGERY_CENTER): Payer: No Typology Code available for payment source | Admitting: Gastroenterology

## 2022-04-01 ENCOUNTER — Encounter: Payer: Self-pay | Admitting: Gastroenterology

## 2022-04-01 VITALS — BP 104/65 | HR 66 | Temp 97.7°F | Resp 16 | Ht 64.0 in | Wt 175.0 lb

## 2022-04-01 DIAGNOSIS — R109 Unspecified abdominal pain: Secondary | ICD-10-CM | POA: Diagnosis not present

## 2022-04-01 DIAGNOSIS — R1012 Left upper quadrant pain: Secondary | ICD-10-CM | POA: Diagnosis present

## 2022-04-01 DIAGNOSIS — R110 Nausea: Secondary | ICD-10-CM

## 2022-04-01 HISTORY — PX: UPPER GASTROINTESTINAL ENDOSCOPY: SHX188

## 2022-04-01 MED ORDER — SODIUM CHLORIDE 0.9 % IV SOLN: 500.0000 mL | Freq: Once | INTRAVENOUS | Status: DC

## 2022-04-01 NOTE — Progress Notes (Signed)
Medical record updated 

## 2022-04-01 NOTE — Op Note (Signed)
[place] Patient Name: Veronica Raymond Procedure Date: 04/01/2022 10:29 AM MRN: [id number] Endoscopist: Mallie Mussel L. Loletha Carrow , MD Age: 36 Referring MD:  Date of Birth: [birth year] Gender: Female Account #: [id number] Procedure:                Upper GI endoscopy Indications:              Abdominal pain in the left upper quadrant/lower                            chest wall                           (years, multiple imaging studies) Medicines:                Monitored Anesthesia Care Procedure:                Pre-Anesthesia Assessment:                           - Prior to the procedure, a History and Physical                            was performed, and patient medications and                            allergies were reviewed. The patient's tolerance of                            previous anesthesia was also reviewed. The risks                            and benefits of the procedure and the sedation                            options and risks were discussed with the patient.                            All questions were answered, and informed consent                            was obtained. Prior Anticoagulants: The patient has                            taken no previous anticoagulant or antiplatelet                            agents. ASA Grade Assessment: II - A patient with                            mild systemic disease. After reviewing the risks                            and benefits, the patient was deemed in  satisfactory condition to undergo the procedure.                           After obtaining informed consent, the endoscope was                            passed under direct vision. Throughout the                            procedure, the patient's blood pressure, pulse, and                            oxygen saturations were monitored continuously. The                            GIF [id number] #[id number] was introduced through the                             mouth, and advanced to the second part of duodenum.                            The upper GI endoscopy was accomplished without                            difficulty. The patient tolerated the procedure                            well. Scope In: Scope Out: Findings:                 The larynx was normal.                           The esophagus was normal.                           The stomach was normal.                           The cardia and gastric fundus were normal on                            retroflexion.                           The examined duodenum was normal. Complications:            No immediate complications. Estimated Blood Loss:     Estimated blood loss: none. Impression:               - Normal larynx.                           - Normal esophagus.                           - Normal stomach.                           -  Normal examined duodenum.                           - No specimens collected.                           This pain does not appear to have a digestive cause. Recommendation:           - Patient has a contact number available for                            emergencies. The signs and symptoms of potential                            delayed complications were discussed with the                            patient. Return to normal activities tomorrow.                            Written discharge instructions were provided to the                            patient.                           - Resume previous diet.                           - Continue present medications.                           - Return to primary care physician. Obi Scrima L. Loletha Carrow, MD 04/01/2022 10:42:57 AM This report has been signed electronically.

## 2022-04-01 NOTE — Progress Notes (Signed)
Pt in recovery with monitors in place, VSS. Report given to receiving RN. Bite guard was placed with pt awake to ensure comfort. No dental or soft tissue damage noted. 

## 2022-04-01 NOTE — Patient Instructions (Signed)
Resume regular diet.  Resume present medications.  Return to primary care physician.  YOU HAD AN ENDOSCOPIC PROCEDURE TODAY AT [facility]:   Refer to the procedure report that was given to you for any specific questions about what was found during the examination.  If the procedure report does not answer your questions, please call your gastroenterologist to clarify.  If you requested that your care partner not be given the details of your procedure findings, then the procedure report has been included in a sealed envelope for you to review at your convenience later.  YOU SHOULD EXPECT: Some feelings of bloating in the abdomen. Passage of more gas than usual.  Walking can help get rid of the air that was put into your GI tract during the procedure and reduce the bloating. If you had a lower endoscopy (such as a colonoscopy or flexible sigmoidoscopy) you may notice spotting of blood in your stool or on the toilet paper. If you underwent a bowel prep for your procedure, you may not have a normal bowel movement for a few days.  Please Note:  You might notice some irritation and congestion in your nose or some drainage.  This is from the oxygen used during your procedure.  There is no need for concern and it should clear up in a day or so.  SYMPTOMS TO REPORT IMMEDIATELY:   Following upper endoscopy (EGD)  Vomiting of blood or coffee ground material  New chest pain or pain under the shoulder blades  Painful or persistently difficult swallowing  New shortness of breath  Fever of 100F or higher  Black, tarry-looking stools  For urgent or emergent issues, a gastroenterologist can be reached at any hour by calling [phone]. Do not use MyChart messaging for urgent concerns.    DIET:  We do recommend a small meal at first, but then you may proceed to your regular diet.  Drink plenty of fluids but you should avoid alcoholic beverages for 24 hours.  ACTIVITY:  You should plan to  take it easy for the rest of today and you should NOT DRIVE or use heavy machinery until tomorrow (because of the sedation medicines used during the test).    FOLLOW UP: Our staff will call the number listed on your records the next business day following your procedure.  We will call around 7:15- 8:00 am to check on you and address any questions or concerns that you may have regarding the information given to you following your procedure. If we do not reach you, we will leave a message.  If you develop any symptoms (ie: fever, flu-like symptoms, shortness of breath, cough etc.) before then, please call [phone].  If you test positive for Covid 19 in the 2 weeks post procedure, please call and report this information to [name].    If any biopsies were taken you will be contacted by phone or by letter within the next 1-3 weeks.  Please call us at [phone] if you have not heard about the biopsies in 3 weeks.    SIGNATURES/CONFIDENTIALITY: You and/or your care partner have signed paperwork which will be entered into your electronic medical record.  These signatures attest to the fact that that the information above on your After Visit Summary has been reviewed and is understood.  Full responsibility of the confidentiality of this discharge information lies with you and/or your care-partner.

## 2022-04-01 NOTE — Progress Notes (Signed)
History and Physical:  This patient presents for endoscopic testing for: Encounter Diagnoses  Name Primary?   LUQ pain Yes   Nausea in adult     Clinical details in my last office note of 01/11/2022.  About 6 years of intermittent left lower chest wall/left upper quadrant twisting pain and intermittent nausea.  Multiple imaging studies over the years unrevealing.  Upper endoscopy to be performed today to investigate any visible digestive causes of this pain.  Patient is otherwise without complaints or active issues today.   Past Medical History: Past Medical History:  Diagnosis Date   Allergic rhinitis    Anxiety    Bartholin gland cyst 05/04/2013   GERD (gastroesophageal reflux disease)    Migraine without aura, without mention of intractable migraine without mention of status migrainosus 07/09/2013   Ovarian cyst 2014   Polyarthralgia    Sjogren's syndrome ([place])    Sleep apnea 1991   as a child, had surgery at age 12 and no longer has sleep apnea   Vaginal Pap smear, abnormal      Past Surgical History: Past Surgical History:  Procedure Laterality Date   ADENOIDECTOMY  1993   ESSURE TUBAL LIGATION  07/05/2014   THERAPEUTIC ABORTION     TONSILLECTOMY  1993   TYMPANOSTOMY TUBE PLACEMENT     UPPER GASTROINTESTINAL ENDOSCOPY  04/01/2022    Allergies: Allergies  Allergen Reactions   Decongestant [Pseudoephedrine] Other (See Comments)    SI    Iodinated Contrast Media Hives   Percocet [Oxycodone-Acetaminophen] Itching   Flonase [Fluticasone]     Triggers migraines   Iodine Hives and Itching   Iohexol Hives and Itching   Oxycodone-Acetaminophen Other (See Comments)   Sulfa Antibiotics Hives and Itching    Outpatient Meds: Current Outpatient Medications  Medication Sig Dispense Refill   cetirizine (ZYRTEC ALLERGY) 10 MG tablet Take 1 tablet (10 mg total) by mouth daily. 30 tablet 1   Multiple Vitamin (MULTIVITAMIN) tablet Take 1 tablet by mouth daily.     Current  Facility-Administered Medications  Medication Dose Route Frequency Provider Last Rate Last Admin   0.9 %  sodium chloride infusion  500 mL Intravenous Once Danis, Estill Cotta III, MD          ___________________________________________________________________ Objective   Exam:  BP (!) 118/58   Pulse 72   Temp 97.7 F (36.5 C) (Temporal)   Ht '5\' 4"'$  (1.626 m)   Wt 175 lb (79.4 kg)   LMP 02/28/2022 (Exact Date) Comment: Tubal ligation  SpO2 100%   BMI 30.04 kg/m   CV: RRR without murmur, S1/S2 Resp: clear to auscultation bilaterally, normal RR and effort noted GI: soft, no tenderness, with active bowel sounds.   Assessment: Encounter Diagnoses  Name Primary?   LUQ pain Yes   Nausea in adult      Plan: EGD  The benefits and risks of the planned procedure were described in detail with the patient or (when appropriate) their health care proxy.  Risks were outlined as including, but not limited to, bleeding, infection, perforation, adverse medication reaction leading to cardiac or pulmonary decompensation, pancreatitis (if ERCP).  The limitation of incomplete mucosal visualization was also discussed.  No guarantees or warranties were given.    The patient is appropriate for an endoscopic procedure in the ambulatory setting.   - Wilfrid Lund, MD

## 2022-04-02 ENCOUNTER — Telehealth: Payer: Self-pay

## 2022-04-02 ENCOUNTER — Ambulatory Visit (INDEPENDENT_AMBULATORY_CARE_PROVIDER_SITE_OTHER): Payer: No Typology Code available for payment source | Admitting: Family Medicine

## 2022-04-02 ENCOUNTER — Encounter: Payer: Self-pay | Admitting: Family Medicine

## 2022-04-02 VITALS — BP 130/80 | HR 85 | Resp 12 | Ht 64.0 in | Wt 170.0 lb

## 2022-04-02 DIAGNOSIS — K582 Mixed irritable bowel syndrome: Secondary | ICD-10-CM

## 2022-04-02 DIAGNOSIS — R112 Nausea with vomiting, unspecified: Secondary | ICD-10-CM

## 2022-04-02 DIAGNOSIS — R1084 Generalized abdominal pain: Secondary | ICD-10-CM

## 2022-04-02 DIAGNOSIS — F419 Anxiety disorder, unspecified: Secondary | ICD-10-CM | POA: Diagnosis not present

## 2022-04-02 MED ORDER — ONDANSETRON HCL 4 MG PO TABS: 4.0000 mg | ORAL_TABLET | Freq: Every day | ORAL | 0 refills | Status: AC | PRN

## 2022-04-02 MED ORDER — SERTRALINE HCL 25 MG PO TABS: 25.0000 mg | ORAL_TABLET | Freq: Every day | ORAL | 3 refills | Status: AC

## 2022-04-02 NOTE — Progress Notes (Unsigned)
HPI: Veronica Raymond is a 36 y.o. female, who is here today to follow on recent visit. She was seen on 03/01/22, when she was c/o abdominal pain and nausea. Lower and upper abdominal pain, also when she applies pressure on RLQ she feels pain in LUQ. Symptoms improve some with defecation. Alternates between diarrhea and constipation. She has not noted blood in stool. She has lost wt but attributes this to exercise and dietary changes. She is concerned about possible IBD and gluten intolerance.  Sometimes she feels the urge of having a bowel movement but it is just gas.  She has seen her GI and underwent EGD. Nausea and vomiting exacerbated by food intake.  She has had some anxiety, seems to be worse this year. Has had several panic attacks. Tried Wellbutrin before.      04/02/2022   12:22 PM 05/02/2019    2:20 PM  GAD 7 : Generalized Anxiety Score  Nervous, Anxious, on Edge 3 3  Control/stop worrying 3 3  Worry too much - different things 3 3  Trouble relaxing 3 2  Restless 2 3  Easily annoyed or irritable 2 3  Afraid - awful might happen 2 3  Total GAD 7 Score 18 20  Anxiety Difficulty Not difficult at all Extremely difficult   Review of Systems  Constitutional:  Negative for activity change, appetite change and fever.  HENT:  Negative for sore throat and trouble swallowing.   Respiratory:  Negative for cough and shortness of breath.   Cardiovascular:  Negative for chest pain and palpitations.  Gastrointestinal:        Negative for changes in bowel habits.  Endocrine: Negative for cold intolerance and heat intolerance.  Genitourinary:  Negative for decreased urine volume, dysuria and hematuria.  Skin:  Negative for rash.  Neurological:  Negative for syncope and weakness.  Hematological:  Negative for adenopathy. Does not bruise/bleed easily.  Psychiatric/Behavioral:  Negative for confusion and hallucinations. The patient is nervous/anxious.   Rest see pertinent  positives and negatives per HPI.  Current Outpatient Medications on File Prior to Visit  Medication Sig Dispense Refill   cetirizine (ZYRTEC ALLERGY) 10 MG tablet Take 1 tablet (10 mg total) by mouth daily. 30 tablet 1   Multiple Vitamin (MULTIVITAMIN) tablet Take 1 tablet by mouth daily.     No current facility-administered medications on file prior to visit.   Past Medical History:  Diagnosis Date   Allergic rhinitis    Anxiety    Bartholin gland cyst 05/04/2013   GERD (gastroesophageal reflux disease)    Migraine without aura, without mention of intractable migraine without mention of status migrainosus 07/09/2013   Ovarian cyst 2014   Polyarthralgia    Sjogren's syndrome ([place])    Sleep apnea 1991   as a child, had surgery at age 84 and no longer has sleep apnea   Vaginal Pap smear, abnormal    Allergies  Allergen Reactions   Decongestant [Pseudoephedrine] Other (See Comments)    SI    Iodinated Contrast Media Hives   Percocet [Oxycodone-Acetaminophen] Itching   Flonase [Fluticasone]     Triggers migraines   Iodine Hives and Itching   Iohexol Hives and Itching   Oxycodone-Acetaminophen Other (See Comments)   Sulfa Antibiotics Hives and Itching    Social History   Socioeconomic History   Marital status: Significant Other    Spouse name: Not on file   Number of children: 3   Years of education: college  Highest education level: Not on file  Occupational History   Occupation: CMA    Employer: [employer]    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[employer]  Tobacco Use   Smoking status: Never   Smokeless tobacco: Never  Vaping Use   Vaping Use: Never used  Substance and Sexual Activity   Alcohol use: Yes    Comment: couple drinks per month   Drug use: Never   Sexual activity: Yes    Birth control/protection: Surgical  Other Topics Concern   Not on file  Social History Narrative   Lives with her 4 children      Lives in two story home      Right handed      Highest  level of edu- Associates      Social Determinants of Health   Financial Resource Strain: Not on file  Food Insecurity: Not on file  Transportation Needs: Not on file  Physical Activity: Not on file  Stress: Not on file  Social Connections: Not on file   Vitals:   04/02/22 1140  BP: 130/80  Pulse: 85  Resp: 12  SpO2: 99%   Body mass index is 29.18 kg/m.  Physical Exam Vitals and nursing note reviewed.  Constitutional:      General: She is not in acute distress.    Appearance: She is well-developed.  HENT:     Head: Normocephalic and atraumatic.     Mouth/Throat:     Mouth: Mucous membranes are moist.     Pharynx: Oropharynx is clear.  Eyes:     Conjunctiva/sclera: Conjunctivae normal.  Cardiovascular:     Rate and Rhythm: Normal rate and regular rhythm.     Heart sounds: No murmur heard. Pulmonary:     Effort: Pulmonary effort is normal. No respiratory distress.     Breath sounds: Normal breath sounds.  Abdominal:     Palpations: Abdomen is soft. There is no hepatomegaly or mass.     Tenderness: There is abdominal tenderness. There is no guarding or rebound.    Skin:    General: Skin is warm.     Findings: No erythema or rash.  Neurological:     General: No focal deficit present.     Mental Status: She is alert and oriented to person, place, and time.     Cranial Nerves: No cranial nerve deficit.     Gait: Gait normal.  Psychiatric:     Comments: Well groomed, good eye contact.    ASSESSMENT AND PLAN:  [name] was seen today for follow-up.  Diagnoses and all orders for this visit:  Abdominal pain, generalized Chronic. We discussed possible etiologies, ? IBS. 08/2018 abdominal/pelvic CT ordered due to generalized abdominal pain, nausea,and vomiting was negative otherwise.  Liver MRI in 09/2021 ordered because liver lesion seen on abdominal US, visualized abdomen negative, hepatic hemangioma. I do not think imaging is needed at this time. Continue  following with GI. Also recommend following with gyn to evaluate for possible gyn etiologies of lower abdominal pain.  Irritable bowel syndrome with both constipation and diarrhea We discussed Dx,prognosis,and treatment options. Could be aggravated by anxiety, Sertraline may help.  Nausea and vomiting in adult Chronic. Recently underwent EGD: Negative. She did not tolerate Reglan in the past. Zofran 4 mg daily as needed. Continue following with GI.  -     ondansetron (ZOFRAN) 4 MG tablet; Take 1 tablet (4 mg total) by mouth daily as needed for nausea or vomiting.  Anxiety disorder, unspecified  type Getting worse. We discussed pharmacologic treatment options. She agrees with trying Sertraline low dose. Some side effects discussed. F/U in 2 months, before if needed.  -     sertraline (ZOLOFT) 25 MG tablet; Take 1 tablet (25 mg total) by mouth daily.  Return in about 2 months (around 06/03/2022).  Daja Shuping G. Martinique, MD  [facility]. [place] office.

## 2022-04-02 NOTE — Patient Instructions (Addendum)
A few things to remember from today's visit:  Irritable bowel syndrome with both constipation and diarrhea  Nausea and vomiting in adult - Plan: ondansetron (ZOFRAN) 4 MG tablet  Anxiety disorder, unspecified type - Plan: sertraline (ZOLOFT) 25 MG tablet  If you need refills please call your pharmacy. Do not use My Chart to request refills or for acute issues that need immediate attention.  Please be sure medication list is accurate. If a new problem present, please set up appointment sooner than planned today.   Today we started Sertraline, this type of medications can increase suicidal risk. This is more prevalent among children,adolecents, and young adults with major depression or other psychiatric disorders. It can also make depression worse. Most common side effects are gastrointestinal, self limited after a few weeks: diarrhea, nausea, constipation  Or diarrhea among some.  In general it is well tolerated. We will follow closely.

## 2022-04-02 NOTE — Telephone Encounter (Signed)
  Follow up Call-     04/01/2022    9:17 AM  Call back number  Post procedure Call Back phone  # [phone]  Permission to leave phone message Yes     Patient questions:  Do you have a fever, pain , or abdominal swelling? Yes.   Pain Score  2 *  Have you tolerated food without any problems? No.  Have you been able to return to your normal activities? Yes.    Do you have any questions about your discharge instructions: Diet   No. Medications  No. Follow up visit  No.  Do you have questions or concerns about your Care? No  Actions: * If pain score is 4 or above: No action needed, pain <4.

## 2022-04-03 ENCOUNTER — Encounter: Payer: Self-pay | Admitting: Family Medicine

## 2022-04-14 NOTE — Progress Notes (Unsigned)
HPI: Veronica Raymond is a 36 y.o. female, who is here today for her routine physical.  Last CPE: 06/06/19. Established with gyn, seen yesterday for her pap smear.  She has not been exercising for 2-3 weeks, stopped to monitor wt changes, still lost a couple more of pounds. In general she follows a healthful diet but for the past few weeks she has decreased oral intake because abdominal pain,nausea, and vomiting.  Vomiting has resolved. Following with GI.  Chronic medical problems: Fibromyalgia,anxiety,and seasonal allergies among some.  Immunization History  Administered Date(s) Administered   Influenza-Unspecified 08/27/2017, 07/12/2019   Health Maintenance  Topic Date Due   COVID-19 Vaccine (1) Never done   Hepatitis C Screening  Never done   INFLUENZA VACCINE  04/27/2022   PAP SMEAR-Modifier  04/15/2025   TETANUS/TDAP  08/28/2027   HIV Screening  Completed   HPV VACCINES  Aged Out   Lab Results  Component Value Date   CHOL 183 08/16/2019   HDL 55 08/16/2019   LDLCALC 112 (H) 08/16/2019   TRIG 89 08/16/2019   CHOLHDL 3.4 01/27/2018   Lab Results  Component Value Date   WBC 7.6 06/17/2021   HGB 12.6 06/17/2021   HCT 37.7 06/17/2021   MCV 93.3 06/17/2021   PLT 354 06/17/2021   Vit D def: She has not been on vit D supplementation for 3 months.  Concerns today: STD screening. Unprotected sex once, no known exposure but she thinks he may have other sex partners. Negative for abnormal vaginal bleeding or vaginal discharge.  Sjogren's disease, follows with rheumatologist. She would like sed rate done today.  Review of Systems  Constitutional:  Positive for appetite change and fatigue. Negative for activity change and fever.  HENT:  Negative for hearing loss, mouth sores and sore throat.   Eyes:  Negative for redness and visual disturbance.  Respiratory:  Negative for cough, shortness of breath and wheezing.   Cardiovascular:  Negative for chest pain and leg  swelling.  Gastrointestinal:  Positive for abdominal pain and nausea. Negative for vomiting.       No changes in bowel habits.  Endocrine: Negative for cold intolerance, heat intolerance, polydipsia, polyphagia and polyuria.  Genitourinary:  Negative for decreased urine volume, dysuria, hematuria, vaginal bleeding and vaginal discharge.  Musculoskeletal:  Positive for arthralgias. Negative for gait problem.  Skin:  Negative for color change and rash.  Allergic/Immunologic: Positive for environmental allergies.  Neurological:  Negative for syncope, weakness and headaches.  Hematological:  Negative for adenopathy. Does not bruise/bleed easily.  Psychiatric/Behavioral:  Negative for confusion. The patient is nervous/anxious.   All other systems reviewed and are negative.  Current Outpatient Medications on File Prior to Visit  Medication Sig Dispense Refill   cetirizine (ZYRTEC ALLERGY) 10 MG tablet Take 1 tablet (10 mg total) by mouth daily. 30 tablet 1   Multiple Vitamin (MULTIVITAMIN) tablet Take 1 tablet by mouth daily.     ondansetron (ZOFRAN) 4 MG tablet Take 1 tablet (4 mg total) by mouth daily as needed for nausea or vomiting. 30 tablet 0   sertraline (ZOLOFT) 25 MG tablet Take 1 tablet (25 mg total) by mouth daily. 30 tablet 3   No current facility-administered medications on file prior to visit.   Past Medical History:  Diagnosis Date   Allergic rhinitis    Anxiety    Bartholin gland cyst 05/04/2013   GERD (gastroesophageal reflux disease)    Migraine without aura, without mention of intractable migraine  without mention of status migrainosus 07/09/2013   Ovarian cyst 2014   Polyarthralgia    Sjogren's syndrome ([place])    Sleep apnea 1991   as a child, had surgery at age 89 and no longer has sleep apnea   Vaginal Pap smear, abnormal    Past Surgical History:  Procedure Laterality Date   ADENOIDECTOMY  1993   ESSURE TUBAL LIGATION  07/05/2014   THERAPEUTIC ABORTION      TONSILLECTOMY  1993   TYMPANOSTOMY TUBE PLACEMENT     UPPER GASTROINTESTINAL ENDOSCOPY  04/01/2022    Allergies  Allergen Reactions   Decongestant [Pseudoephedrine] Other (See Comments)    SI    Iodinated Contrast Media Hives   Percocet [Oxycodone-Acetaminophen] Itching   Flonase [Fluticasone]     Triggers migraines   Iodine Hives and Itching   Iohexol Hives and Itching   Sulfa Antibiotics Hives and Itching   Family History  Problem Relation Age of Onset   Hypertension Mother    Sleep apnea Mother    Obesity Mother    Heart disease Father    Alcohol abuse Father        dying from this   Diabetes Father    High blood pressure Father    Anxiety disorder Father    AAA (abdominal aortic aneurysm) Father    Mental retardation Maternal Uncle    Diabetes Maternal Grandmother    Hypertension Maternal Grandmother    Kidney disease Maternal Grandmother    Heart disease Maternal Grandmother    Stroke Maternal Grandmother    Prostate cancer Maternal Grandfather    Hypertension Paternal Grandmother    Alcohol abuse Other    Colon cancer Neg Hx    Colon polyps Neg Hx    Esophageal cancer Neg Hx    Rectal cancer Neg Hx    Stomach cancer Neg Hx    Social History   Socioeconomic History   Marital status: Significant Other    Spouse name: Not on file   Number of children: 3   Years of education: college   Highest education level: Not on file  Occupational History   Occupation: CMA    Employer: [employer]    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[employer]  Tobacco Use   Smoking status: Never   Smokeless tobacco: Never  Vaping Use   Vaping Use: Never used  Substance and Sexual Activity   Alcohol use: Yes    Comment: couple drinks per month   Drug use: Never   Sexual activity: Yes    Birth control/protection: Surgical  Other Topics Concern   Not on file  Social History Narrative   Lives with her 4 children      Lives in two story home      Right handed      Highest level of  edu- [employer]      Social Determinants of Health   Financial Resource Strain: Not on file  Food Insecurity: Not on file  Transportation Needs: Not on file  Physical Activity: Not on file  Stress: Not on file  Social Connections: Not on file   Vitals:   04/16/22 1400  BP: 110/76  Pulse: 72  Resp: 12  Temp: 97.9 F (36.6 C)  SpO2: 99%   Body mass index is 28.88 kg/m.  Wt Readings from Last 3 Encounters:  04/16/22 168 lb 4 oz (76.3 kg)  04/02/22 170 lb (77.1 kg)  04/01/22 175 lb (79.4 kg)   Physical Exam Vitals and nursing  note reviewed.  Constitutional:      General: She is not in acute distress.    Appearance: She is well-developed.  HENT:     Head: Normocephalic and atraumatic.     Right Ear: Hearing, tympanic membrane, ear canal and external ear normal.     Left Ear: Hearing, tympanic membrane, ear canal and external ear normal.     Mouth/Throat:     Mouth: Mucous membranes are moist.     Pharynx: Oropharynx is clear. Uvula midline.  Eyes:     Extraocular Movements: Extraocular movements intact.     Conjunctiva/sclera: Conjunctivae normal.     Pupils: Pupils are equal, round, and reactive to light.  Neck:     Thyroid: No thyromegaly.     Trachea: No tracheal deviation.  Cardiovascular:     Rate and Rhythm: Normal rate and regular rhythm.     Pulses:          Dorsalis pedis pulses are 2+ on the right side and 2+ on the left side.     Heart sounds: No murmur heard. Pulmonary:     Effort: Pulmonary effort is normal. No respiratory distress.     Breath sounds: Normal breath sounds.  Abdominal:     Palpations: Abdomen is soft. There is no hepatomegaly or mass.     Tenderness: There is no abdominal tenderness.  Genitourinary:    Comments: Deferred to gyn. Musculoskeletal:     Comments: No major deformity or signs of synovitis appreciated.  Lymphadenopathy:     Cervical: No cervical adenopathy.     Upper Body:     Right upper body: No supraclavicular  adenopathy.     Left upper body: No supraclavicular adenopathy.  Skin:    General: Skin is warm.     Findings: No erythema or rash.  Neurological:     General: No focal deficit present.     Mental Status: She is alert and oriented to person, place, and time.     Cranial Nerves: No cranial nerve deficit.     Coordination: Coordination normal.     Gait: Gait normal.     Deep Tendon Reflexes:     Reflex Scores:      Bicep reflexes are 2+ on the right side and 2+ on the left side.      Patellar reflexes are 2+ on the right side and 2+ on the left side. Psychiatric:        Mood and Affect: Mood is anxious.     Comments: Well groomed, good eye contact.   ASSESSMENT AND PLAN:  Veronica Raymond was here today annual physical examination.  Orders Placed This Encounter  Procedures   C-reactive protein   Sedimentation rate   Lipid panel   VITAMIN D 25 Hydroxy (Vit-D Deficiency, Fractures)   CMP with eGFR([facility])   HIV antibody (with reflex)   RPR   Hepatitis C antibody   CBC   Lab Results  Component Value Date   WBC 6.5 04/16/2022   HGB 13.0 04/16/2022   HCT 39.4 04/16/2022   MCV 94.0 04/16/2022   PLT 299 04/16/2022   Lab Results  Component Value Date   CHOL 195 04/16/2022   HDL 60 04/16/2022   LDLCALC 115 (H) 04/16/2022   TRIG 101 04/16/2022   CHOLHDL 3.3 04/16/2022   Lab Results  Component Value Date   CREATININE 0.78 04/16/2022   BUN 14 04/16/2022   NA 137 04/16/2022   K 4.0 04/16/2022  CL 103 04/16/2022   CO2 21 04/16/2022   Lab Results  Component Value Date   ALT 10 04/16/2022   AST 12 04/16/2022   ALKPHOS 49 06/17/2021   BILITOT 0.5 04/16/2022   Lab Results  Component Value Date   CRP 1.2 04/16/2022   Routine general medical examination at a health care facility We discussed the importance of regular physical activity and healthy diet for prevention of chronic illness and/or complications. Preventive guidelines reviewed. Vaccination up to  date. Continue her female preventive care with her gyn. Next CPE in a year.  Screening for endocrine, metabolic and immunity disorder -     CMP with eGFR([facility]); Future  Sjogren's syndrome, with unspecified organ involvement [facility]) Following with rheumatologist.  Screen for STD (sexually transmitted disease) -     HIV antibody (with reflex); Future -     RPR; Future  Vitamin D deficiency Not on vit D supplementation. Further recommendations according to 25 OH vit D result.  Encounter for HCV screening test for low risk patient -     Hepatitis C antibody; Future  Lipid screening -     Lipid panel; Future  Return in 1 year (on 04/17/2023) for CPE.  Naavya Postma G. Martinique, MD  [facility]. [place] office.

## 2022-04-16 ENCOUNTER — Ambulatory Visit (INDEPENDENT_AMBULATORY_CARE_PROVIDER_SITE_OTHER): Payer: No Typology Code available for payment source | Admitting: Family Medicine

## 2022-04-16 ENCOUNTER — Encounter: Payer: Self-pay | Admitting: Family Medicine

## 2022-04-16 VITALS — BP 110/76 | HR 72 | Temp 97.9°F | Resp 12 | Ht 64.0 in | Wt 168.2 lb

## 2022-04-16 DIAGNOSIS — Z113 Encounter for screening for infections with a predominantly sexual mode of transmission: Secondary | ICD-10-CM

## 2022-04-16 DIAGNOSIS — Z1329 Encounter for screening for other suspected endocrine disorder: Secondary | ICD-10-CM

## 2022-04-16 DIAGNOSIS — E559 Vitamin D deficiency, unspecified: Secondary | ICD-10-CM

## 2022-04-16 DIAGNOSIS — Z1159 Encounter for screening for other viral diseases: Secondary | ICD-10-CM

## 2022-04-16 DIAGNOSIS — Z0000 Encounter for general adult medical examination without abnormal findings: Secondary | ICD-10-CM

## 2022-04-16 DIAGNOSIS — M3500 Sicca syndrome, unspecified: Secondary | ICD-10-CM

## 2022-04-16 DIAGNOSIS — Z13228 Encounter for screening for other metabolic disorders: Secondary | ICD-10-CM

## 2022-04-16 DIAGNOSIS — Z13220 Encounter for screening for lipoid disorders: Secondary | ICD-10-CM

## 2022-04-16 DIAGNOSIS — Z130 Encounter for screening for diseases of the blood and blood-forming organs and certain disorders involving the immune mechanism: Secondary | ICD-10-CM

## 2022-04-16 NOTE — Patient Instructions (Addendum)
A few things to remember from today's visit:  Routine general medical examination at a health care facility  Screening for endocrine, metabolic and immunity disorder - Plan: CMP with eGFR([facility])  Sjogren's syndrome, with unspecified organ involvement ([place]), Chronic - Plan: C-reactive protein, Sedimentation rate, CBC  Screen for STD (sexually transmitted disease) - Plan: HIV antibody (with reflex), RPR  Vitamin D deficiency - Plan: VITAMIN D 25 Hydroxy (Vit-D Deficiency, Fractures)  Encounter for HCV screening test for low risk patient - Plan: Hepatitis C antibody  Lipid screening - Plan: Lipid panel  Do not use My Chart to request refills or for acute issues that need immediate attention.  Please be sure medication list is accurate. If a new problem present, please set up appointment sooner than planned today.  Health Maintenance, Female Adopting a healthy lifestyle and getting preventive care are important in promoting health and wellness. Ask your health care provider about: The right schedule for you to have regular tests and exams. Things you can do on your own to prevent diseases and keep yourself healthy. What should I know about diet, weight, and exercise? Eat a healthy diet  Eat a diet that includes plenty of vegetables, fruits, low-fat dairy products, and lean protein. Do not eat a lot of foods that are high in solid fats, added sugars, or sodium. Maintain a healthy weight Body mass index (BMI) is used to identify weight problems. It estimates body fat based on height and weight. Your health care provider can help determine your BMI and help you achieve or maintain a healthy weight. Get regular exercise Get regular exercise. This is one of the most important things you can do for your health. Most adults should: Exercise for at least 150 minutes each week. The exercise should increase your heart rate and make you sweat (moderate-intensity exercise). Do strengthening  exercises at least twice a week. This is in addition to the moderate-intensity exercise. Spend less time sitting. Even light physical activity can be beneficial. Watch cholesterol and blood lipids Have your blood tested for lipids and cholesterol at 35 years of age, then have this test every 5 years. Have your cholesterol levels checked more often if: Your lipid or cholesterol levels are high. You are older than 35 years of age. You are at high risk for heart disease. What should I know about cancer screening? Depending on your health history and family history, you may need to have cancer screening at various ages. This may include screening for: Breast cancer. Cervical cancer. Colorectal cancer. Skin cancer. Lung cancer. What should I know about heart disease, diabetes, and high blood pressure? Blood pressure and heart disease High blood pressure causes heart disease and increases the risk of stroke. This is more likely to develop in people who have high blood pressure readings or are overweight. Have your blood pressure checked: Every 3-5 years if you are 53-14 years of age. Every year if you are 50 years old or older. Diabetes Have regular diabetes screenings. This checks your fasting blood sugar level. Have the screening done: Once every three years after age 50 if you are at a normal weight and have a low risk for diabetes. More often and at a younger age if you are overweight or have a high risk for diabetes. What should I know about preventing infection? Hepatitis B If you have a higher risk for hepatitis B, you should be screened for this virus. Talk with your health care provider to find out if  you are at risk for hepatitis B infection. Hepatitis C Testing is recommended for: Everyone born from [birth year] through 1964. Anyone with known risk factors for hepatitis C. Sexually transmitted infections (STIs) Get screened for STIs, including gonorrhea and chlamydia, if: You are  sexually active and are younger than 35 years of age. You are older than 35 years of age and your health care provider tells you that you are at risk for this type of infection. Your sexual activity has changed since you were last screened, and you are at increased risk for chlamydia or gonorrhea. Ask your health care provider if you are at risk. Ask your health care provider about whether you are at high risk for HIV. Your health care provider may recommend a prescription medicine to help prevent HIV infection. If you choose to take medicine to prevent HIV, you should first get tested for HIV. You should then be tested every 3 months for as long as you are taking the medicine. Pregnancy If you are about to stop having your period (premenopausal) and you may become pregnant, seek counseling before you get pregnant. Take 400 to 800 micrograms (mcg) of folic acid every day if you become pregnant. Ask for birth control (contraception) if you want to prevent pregnancy. Osteoporosis and menopause Osteoporosis is a disease in which the bones lose minerals and strength with aging. This can result in bone fractures. If you are 44 years old or older, or if you are at risk for osteoporosis and fractures, ask your health care provider if you should: Be screened for bone loss. Take a calcium or vitamin D supplement to lower your risk of fractures. Be given hormone replacement therapy (HRT) to treat symptoms of menopause. Follow these instructions at home: Alcohol use Do not drink alcohol if: Your health care provider tells you not to drink. You are pregnant, may be pregnant, or are planning to become pregnant. If you drink alcohol: Limit how much you have to: 0-1 drink a day. Know how much alcohol is in your drink. In the [place] one drink equals one 12 oz bottle of beer (355 mL), one 5 oz glass of wine (148 mL), or one 1 oz glass of hard liquor (44 mL). Lifestyle Do not use any products that contain  nicotine or tobacco. These products include cigarettes, chewing tobacco, and vaping devices, such as e-cigarettes. If you need help quitting, ask your health care provider. Do not use street drugs. Do not share needles. Ask your health care provider for help if you need support or information about quitting drugs. General instructions Schedule regular health, dental, and eye exams. Stay current with your vaccines. Tell your health care provider if: You often feel depressed. You have ever been abused or do not feel safe at home. Summary Adopting a healthy lifestyle and getting preventive care are important in promoting health and wellness. Follow your health care provider's instructions about healthy diet, exercising, and getting tested or screened for diseases. Follow your health care provider's instructions on monitoring your cholesterol and blood pressure. This information is not intended to replace advice given to you by your health care provider. Make sure you discuss any questions you have with your health care provider. Document Revised: 02/02/2021 Document Reviewed: 02/02/2021 Elsevier Patient Education  [place].

## 2022-04-17 LAB — COMPLETE METABOLIC PANEL WITH GFR
AG Ratio: 1.4 (calc) (ref 1.0–2.5)
ALT: 10 U/L (ref 6–29)
AST: 12 U/L (ref 10–30)
Albumin: 4.4 g/dL (ref 3.6–5.1)
Alkaline phosphatase (APISO): 56 U/L (ref 31–125)
BUN: 14 mg/dL (ref 7–25)
CO2: 21 mmol/L (ref 20–32)
Calcium: 9.5 mg/dL (ref 8.6–10.2)
Chloride: 103 mmol/L (ref 98–110)
Creat: 0.78 mg/dL (ref 0.50–0.97)
Globulin: 3.2 g/dL (calc) (ref 1.9–3.7)
Glucose, Bld: 76 mg/dL (ref 65–99)
Potassium: 4 mmol/L (ref 3.5–5.3)
Sodium: 137 mmol/L (ref 135–146)
Total Bilirubin: 0.5 mg/dL (ref 0.2–1.2)
Total Protein: 7.6 g/dL (ref 6.1–8.1)
eGFR: 102 mL/min/{1.73_m2} (ref >=60)

## 2022-04-19 LAB — RPR: RPR Ser Ql: NONREACTIVE

## 2022-04-19 LAB — LIPID PANEL
Cholesterol: 195 mg/dL (ref <200)
HDL: 60 mg/dL (ref >=50)
LDL Cholesterol (Calc): 115 mg/dL (calc) — ABNORMAL HIGH
Non-HDL Cholesterol (Calc): 135 mg/dL (calc) — ABNORMAL HIGH (ref <130)
Total CHOL/HDL Ratio: 3.3 (calc) (ref <5.0)
Triglycerides: 101 mg/dL (ref <150)

## 2022-04-19 LAB — CBC
HCT: 39.4 % (ref 35.0–45.0)
Hemoglobin: 13 g/dL (ref 11.7–15.5)
MCH: 31 pg (ref 27.0–33.0)
MCHC: 33 g/dL (ref 32.0–36.0)
MCV: 94 fL (ref 80.0–100.0)
MPV: 9.6 fL (ref 7.5–12.5)
Platelets: 299 10*3/uL (ref 140–400)
RBC: 4.19 10*6/uL (ref 3.80–5.10)
RDW: 11.6 % (ref 11.0–15.0)
WBC: 6.5 10*3/uL (ref 3.8–10.8)

## 2022-04-19 LAB — SEDIMENTATION RATE

## 2022-04-19 LAB — HEPATITIS C ANTIBODY: Hepatitis C Ab: NONREACTIVE

## 2022-04-19 LAB — VITAMIN D 25 HYDROXY (VIT D DEFICIENCY, FRACTURES): Vit D, 25-Hydroxy: 21 ng/mL — ABNORMAL LOW (ref 30–100)

## 2022-04-19 LAB — C-REACTIVE PROTEIN: CRP: 1.2 mg/L (ref <8.0)

## 2022-04-19 LAB — HIV ANTIBODY (ROUTINE TESTING W REFLEX): HIV 1&2 Ab, 4th Generation: NONREACTIVE

## 2022-05-05 ENCOUNTER — Encounter (INDEPENDENT_AMBULATORY_CARE_PROVIDER_SITE_OTHER): Payer: Self-pay

## 2022-06-07 ENCOUNTER — Encounter: Payer: Self-pay | Admitting: Family Medicine

## 2022-06-09 ENCOUNTER — Encounter: Payer: Self-pay | Admitting: Family Medicine

## 2022-06-22 ENCOUNTER — Ambulatory Visit
Admission: EM | Admit: 2022-06-22 | Discharge: 2022-06-22 | Disposition: A | Discharge status: Home / Self Care | Payer: No Typology Code available for payment source | Attending: Physician Assistant | Admitting: Physician Assistant

## 2022-06-22 ENCOUNTER — Encounter: Payer: Self-pay | Admitting: Physician Assistant

## 2022-06-22 DIAGNOSIS — J069 Acute upper respiratory infection, unspecified: Secondary | ICD-10-CM | POA: Diagnosis present

## 2022-06-22 DIAGNOSIS — Z1152 Encounter for screening for COVID-19: Secondary | ICD-10-CM | POA: Diagnosis present

## 2022-06-22 LAB — POCT RAPID STREP A (OFFICE): Rapid Strep A Screen: NEGATIVE

## 2022-06-22 LAB — RESP PANEL BY RT-PCR (RSV, FLU A&B, COVID)  RVPGX2
Influenza A by PCR: NEGATIVE
Influenza B by PCR: NEGATIVE
Resp Syncytial Virus by PCR: NEGATIVE
SARS Coronavirus 2 by RT PCR: NEGATIVE

## 2022-06-22 NOTE — ED Triage Notes (Addendum)
Pt presents to [facility] with co og cough, congestion, ha, and fevers for 3 days, pt was tested at work for covid which was negative. Pt wants to be retested for covid/flu/rsv. Pt took Nyquil last night

## 2022-06-22 NOTE — ED Provider Notes (Signed)
[facility]    CSN: [id number] Arrival date & time: 06/22/22  1127      History   Chief Complaint Chief Complaint  Patient presents with   Cough    HPI Veronica Raymond is a 36 y.o. female.   Patient here today for evaluation of nasal congestion, drainage, cough, fevers and sore throat that started about 3 days ago.  She has not had any nausea or vomiting.  She denies any diarrhea.  She has tried taking over-the-counter medication without resolution of symptoms.  The history is provided by the patient.  Cough Associated symptoms: fever and sore throat   Associated symptoms: no ear pain, no eye discharge, no shortness of breath and no wheezing     Past Medical History:  Diagnosis Date   Allergic rhinitis    Anxiety    Bartholin gland cyst 05/04/2013   GERD (gastroesophageal reflux disease)    Migraine without aura, without mention of intractable migraine without mention of status migrainosus 07/09/2013   Ovarian cyst 2014   Polyarthralgia    Sjogren's syndrome ([place])    Sleep apnea 1991   as a child, had surgery at age 24 and no longer has sleep apnea   Vaginal Pap smear, abnormal     Patient Active Problem List   Diagnosis Date Noted   Herpes labialis without complication 85/10/7739   Fibromyalgia 06/24/2020   Sjogren's disease ([place]) 06/24/2020   Vitamin D deficiency 08/20/2019   Class 1 obesity due to excess calories without serious comorbidity with body mass index (BMI) of 31.0 to 31.9 in adult 08/16/2019   Polyarthralgia 03/07/2019   History of abnormal cervical Pap smear 01/28/2018   Allergic rhinitis    GERD (gastroesophageal reflux disease)    Migraine without aura 07/09/2013    Past Surgical History:  Procedure Laterality Date   ADENOIDECTOMY  1993   ESSURE TUBAL LIGATION  07/05/2014   THERAPEUTIC ABORTION     TONSILLECTOMY  1993   TYMPANOSTOMY TUBE PLACEMENT     UPPER GASTROINTESTINAL ENDOSCOPY  04/01/2022    OB History      Gravida  5   Para  4   Term  4   Preterm      AB  1   Living  4      SAB      IAB  1   Ectopic      Multiple      Live Births  4            Home Medications    Prior to Admission medications   Medication Sig Start Date End Date Taking? Authorizing Provider  cetirizine (ZYRTEC ALLERGY) 10 MG tablet Take 1 tablet (10 mg total) by mouth daily. 09/13/20   Volney American, PA-C  Multiple Vitamin (MULTIVITAMIN) tablet Take 1 tablet by mouth daily.    [provider]  sertraline (ZOLOFT) 25 MG tablet Take 1 tablet (25 mg total) by mouth daily. 04/02/22   Martinique, Betty G, MD    Family History Family History  Problem Relation Age of Onset   Hypertension Mother    Sleep apnea Mother    Obesity Mother    Heart disease Father    Alcohol abuse Father        dying from this   Diabetes Father    High blood pressure Father    Anxiety disorder Father    AAA (abdominal aortic aneurysm) Father    Mental retardation Maternal Uncle  Diabetes Maternal Grandmother    Hypertension Maternal Grandmother    Kidney disease Maternal Grandmother    Heart disease Maternal Grandmother    Stroke Maternal Grandmother    Prostate cancer Maternal Grandfather    Hypertension Paternal Grandmother    Alcohol abuse Other    Colon cancer Neg Hx    Colon polyps Neg Hx    Esophageal cancer Neg Hx    Rectal cancer Neg Hx    Stomach cancer Neg Hx     Social History Social History   Tobacco Use   Smoking status: Never   Smokeless tobacco: Never  Vaping Use   Vaping Use: Never used  Substance Use Topics   Alcohol use: Yes    Comment: couple drinks per month   Drug use: Never     Allergies   Decongestant [pseudoephedrine], Iodinated contrast media, Percocet [oxycodone-acetaminophen], Flonase [fluticasone], Iodine, Iohexol, and Sulfa antibiotics   Review of Systems Review of Systems  Constitutional:  Positive for fever.  HENT:  Positive for congestion and sore  throat. Negative for ear pain.   Eyes:  Negative for discharge and redness.  Respiratory:  Positive for cough. Negative for shortness of breath and wheezing.   Gastrointestinal:  Negative for abdominal pain, diarrhea, nausea and vomiting.     Physical Exam Triage Vital Signs ED Triage Vitals  Enc Vitals Group     BP 06/22/22 1244 109/73     Pulse Rate 06/22/22 1244 70     Resp 06/22/22 1244 18     Temp 06/22/22 1244 99.5 F (37.5 C)     Temp Source 06/22/22 1244 Oral     SpO2 06/22/22 1244 98 %     Weight --      Height --      Head Circumference --      Peak Flow --      Pain Score 06/22/22 1243 0     Pain Loc --      Pain Edu? --      Excl. in [place]? --    No data found.  Updated Vital Signs BP 109/73   Pulse 70   Temp 99.5 F (37.5 C) (Oral)   Resp 18   LMP 06/18/2022 (Exact Date)   SpO2 98%      Physical Exam Vitals and nursing note reviewed.  Constitutional:      General: She is not in acute distress.    Appearance: Normal appearance. She is not ill-appearing.  HENT:     Head: Normocephalic and atraumatic.     Nose: Congestion present.     Mouth/Throat:     Mouth: Mucous membranes are moist.     Pharynx: Posterior oropharyngeal erythema present. No oropharyngeal exudate.  Eyes:     Conjunctiva/sclera: Conjunctivae normal.  Cardiovascular:     Rate and Rhythm: Normal rate and regular rhythm.     Heart sounds: Normal heart sounds. No murmur heard. Pulmonary:     Effort: Pulmonary effort is normal. No respiratory distress.     Breath sounds: Normal breath sounds. No wheezing, rhonchi or rales.  Skin:    General: Skin is warm and dry.  Neurological:     Mental Status: She is alert.  Psychiatric:        Mood and Affect: Mood normal.        Thought Content: Thought content normal.      UC Treatments / Results  Labs (all labs ordered are listed, but only abnormal results are  displayed) Labs Reviewed  RESP PANEL BY RT-PCR (RSV, FLU A&B, COVID)   [id number]  POCT RAPID STREP A (OFFICE)    EKG   Radiology No results found.  Procedures Procedures (including critical care time)  Medications Ordered in UC Medications - No data to display  Initial Impression / Assessment and Plan / UC Course  I have reviewed the triage vital signs and the nursing notes.  Pertinent labs & imaging results that were available during my care of the patient were reviewed by me and considered in my medical decision making (see chart for details).    Rapid strep test negative.  Will order COVID, flu and RSV at patient's request.  Encouraged symptomatic treatment while awaiting results and follow-up with any further concerns.  Final Clinical Impressions(s) / UC Diagnoses   Final diagnoses:  Acute upper respiratory infection  Encounter for screening for COVID-19   Discharge Instructions   None    ED Prescriptions   None    PDMP not reviewed this encounter.   Francene Finders, PA-C 06/22/22 [phone]

## 2022-06-25 ENCOUNTER — Ambulatory Visit (INDEPENDENT_AMBULATORY_CARE_PROVIDER_SITE_OTHER): Payer: No Typology Code available for payment source | Admitting: Family Medicine

## 2022-06-25 ENCOUNTER — Encounter: Payer: Self-pay | Admitting: Family Medicine

## 2022-06-25 VITALS — BP 120/80 | HR 69 | Temp 98.7°F | Resp 12 | Ht 64.0 in | Wt 172.0 lb

## 2022-06-25 DIAGNOSIS — H6980 Other specified disorders of Eustachian tube, unspecified ear: Secondary | ICD-10-CM

## 2022-06-25 DIAGNOSIS — R052 Subacute cough: Secondary | ICD-10-CM | POA: Diagnosis not present

## 2022-06-25 DIAGNOSIS — J069 Acute upper respiratory infection, unspecified: Secondary | ICD-10-CM

## 2022-06-25 DIAGNOSIS — H6990 Unspecified Eustachian tube disorder, unspecified ear: Secondary | ICD-10-CM

## 2022-06-25 MED ORDER — HYDROCODONE BIT-HOMATROP MBR 5-1.5 MG/5ML PO SOLN: 5.0000 mL | Freq: Two times a day (BID) | ORAL | 0 refills | Status: AC | PRN

## 2022-06-25 NOTE — Progress Notes (Signed)
ACUTE VISIT Chief Complaint  Patient presents with   Nasal Congestion   HPI: Ms.Tonna R Silveira is a 36 y.o. female with history of vitamin D deficiency, Sjogren's disease, polyarthralgia, fibromyalgia, GERD, and migraine headaches here today complaining of nasal and chest congestion. She has associated upper respiratory symptoms with low-grade fever, most have resolved but she is still having some generalized headache, nasal congestion, postnasal drainage, and productive cough with greenish sputum. Left ear fullness sensation, "popping" at times,no longer having earache.  Cough This is a new problem. The current episode started in the past 7 days. The problem has been gradually improving. The problem occurs every few hours. The cough is Productive of sputum. Associated symptoms include chest pain (chest wall pain with cough), ear congestion, headaches, nasal congestion, postnasal drip and rhinorrhea. Pertinent negatives include no fever, heartburn, hemoptysis, myalgias, rash, sore throat, shortness of breath, sweats or wheezing. Her past medical history is significant for environmental allergies.  Cough is interfering with sleep. Evaluated in the ED on 06/22/2022 Rapid strep, PCR for RSV, rapid flu, and COVID-19 done in the ED, all negative. She is taking ibuprofen.  Her children were sick with similar symptoms. Review of Systems  Constitutional:  Negative for fever.  HENT:  Positive for postnasal drip and rhinorrhea. Negative for ear discharge, facial swelling and sore throat.   Respiratory:  Positive for cough. Negative for hemoptysis, shortness of breath and wheezing.   Cardiovascular:  Positive for chest pain (chest wall pain with cough).  Gastrointestinal:  Negative for abdominal pain, heartburn, nausea and vomiting.       No changes in bowel habits.  Musculoskeletal:  Negative for myalgias.  Skin:  Negative for rash.  Allergic/Immunologic: Positive for environmental allergies.   Neurological:  Positive for headaches. Negative for syncope and weakness.  Rest see pertinent positives and negatives per HPI.  Current Outpatient Medications on File Prior to Visit  Medication Sig Dispense Refill   cetirizine (ZYRTEC ALLERGY) 10 MG tablet Take 1 tablet (10 mg total) by mouth daily. 30 tablet 1   Multiple Vitamin (MULTIVITAMIN) tablet Take 1 tablet by mouth daily.     sertraline (ZOLOFT) 25 MG tablet Take 1 tablet (25 mg total) by mouth daily. 30 tablet 3   No current facility-administered medications on file prior to visit.   Past Medical History:  Diagnosis Date   Allergic rhinitis    Anxiety    Bartholin gland cyst 05/04/2013   GERD (gastroesophageal reflux disease)    Migraine without aura, without mention of intractable migraine without mention of status migrainosus 07/09/2013   Ovarian cyst 2014   Polyarthralgia    Sjogren's syndrome ([place])    Sleep apnea 1991   as a child, had surgery at age 80 and no longer has sleep apnea   Vaginal Pap smear, abnormal    Allergies  Allergen Reactions   Decongestant [Pseudoephedrine] Other (See Comments)    SI    Iodinated Contrast Media Hives   Percocet [Oxycodone-Acetaminophen] Itching   Flonase [Fluticasone]     Triggers migraines   Iodine Hives and Itching   Iohexol Hives and Itching   Sulfa Antibiotics Hives and Itching   Social History   Socioeconomic History   Marital status: Significant Other    Spouse name: Not on file   Number of children: 3   Years of education: college   Highest education level: Not on file  Occupational History   Occupation: CMA    Employer: [employer]    Comment: [employer] at [employer]  Tobacco Use   Smoking status: Never   Smokeless tobacco: Never  Vaping Use   Vaping Use: Never used  Substance and Sexual Activity   Alcohol use: Yes    Comment: couple drinks per month   Drug use: Never   Sexual activity: Yes    Birth control/protection: Surgical  Other Topics Concern    Not on file  Social History Narrative   Lives with her 4 children      Lives in two story home      Right handed      Highest level of edu- [employer]      Social Determinants of Health   Financial Resource Strain: Not on file  Food Insecurity: Not on file  Transportation Needs: Not on file  Physical Activity: Not on file  Stress: Not on file  Social Connections: Not on file   Vitals:   06/25/22 1631  BP: 120/80  Pulse: 69  Resp: 12  Temp: 98.7 F (37.1 C)  SpO2: 99%  Body mass index is 29.52 kg/m.  Physical Exam Vitals and nursing note reviewed.  Constitutional:      General: She is not in acute distress.    Appearance: She is well-developed. She is not ill-appearing.  HENT:     Head: Atraumatic.     Right Ear: Ear canal and external ear normal. No tenderness. A middle ear effusion is present. Tympanic membrane is erythematous (Localized and mild.).     Left Ear: Ear canal and external ear normal. No tenderness. Tympanic membrane is scarred. Tympanic membrane is not erythematous or bulging.     Ears:      Nose: Congestion and rhinorrhea present.     Right Sinus: No maxillary sinus tenderness or frontal sinus tenderness.     Left Sinus: No maxillary sinus tenderness or frontal sinus tenderness.     Mouth/Throat:     Pharynx: Oropharynx is clear. Uvula midline. No posterior oropharyngeal erythema.  Eyes:     Conjunctiva/sclera: Conjunctivae normal.  Cardiovascular:     Rate and Rhythm: Normal rate and regular rhythm.     Heart sounds: No murmur heard. Pulmonary:     Effort: Pulmonary effort is normal. No respiratory distress.     Breath sounds: Normal breath sounds. No stridor.  Lymphadenopathy:     Head:     Right side of head: No preauricular adenopathy.     Left side of head: No preauricular adenopathy.     Cervical: No cervical adenopathy.  Skin:    General: Skin is warm.     Findings: No erythema or rash.  Neurological:     Mental Status: She is  alert and oriented to person, place, and time.  Psychiatric:        Mood and Affect: Mood and affect normal.   ASSESSMENT AND PLAN:  Ms.Adama was seen today for nasal congestion.  Diagnoses and all orders for this visit:  Dysfunction of Eustachian tube, unspecified laterality Reporting symptoms in left ear, I did not appreciate significant abnormalities except for scaring changes. Right TM with mild localized erythema and middle ear effusion, she is not having symptoms, so I do not think antibiotic is needed at this time.  She was instructed to let me know if she starts having right earache. Auto inflation maneuvers a few times throughout the day will help. Hearing Screening   '500Hz'$  '1000Hz'$  '2000Hz'$  '4000Hz'$   Right ear Pass Pass Pass Pass  Left ear Pass Pass Pass Pass   URI, acute History suggest a viral respiratory infection. Most symptoms have resolved. Explained that congestion and cough can last a few more weeks after acute symptoms have resolved. Continue monitoring for new symptoms, including fever.  Subacute cough Lung auscultation negative today, I do not think imaging is needed at this time but it was still offered, she agrees to wait on this. Cough is interfering with his sleep, so recommend Hycodan at bedtime. Adequate hydration.  -     HYDROcodone bit-homatropine (HYCODAN) 5-1.5 MG/5ML syrup; Take 5 mLs by mouth every 12 (twelve) hours as needed for up to 10 days for cough.  Return if symptoms worsen or fail to improve.  Yamari Ventola G. Martinique, MD  [facility]. [place] office.

## 2022-08-03 ENCOUNTER — Encounter: Payer: Self-pay | Admitting: Adult Health

## 2022-08-03 ENCOUNTER — Telehealth (INDEPENDENT_AMBULATORY_CARE_PROVIDER_SITE_OTHER): Payer: No Typology Code available for payment source | Admitting: Adult Health

## 2022-08-03 DIAGNOSIS — Z7251 High risk heterosexual behavior: Secondary | ICD-10-CM | POA: Diagnosis not present

## 2022-08-03 DIAGNOSIS — Z114 Encounter for screening for human immunodeficiency virus [HIV]: Secondary | ICD-10-CM

## 2022-08-03 DIAGNOSIS — Z113 Encounter for screening for infections with a predominantly sexual mode of transmission: Secondary | ICD-10-CM

## 2022-08-03 MED ORDER — BIKTARVY 50-200-25 MG PO TABS: 1.0000 | ORAL_TABLET | Freq: Every day | ORAL | 0 refills | Status: DC

## 2022-08-03 NOTE — Progress Notes (Signed)
Virtual Visit via Video Note  I connected with Gwenyth Ober  on 08/03/22 at  5:15 PM EST by a video enabled telemedicine application and verified that I am speaking with the correct person using two identifiers.  Location patient: home Location provider:work or home office Persons participating in the virtual visit: patient, provider  I discussed the limitations of evaluation and management by telemedicine and the availability of in person appointments. The patient expressed understanding and agreed to proceed.   HPI: 36 year old female who is being seen for an acute issue.  She reports having questionable unprotected sex 2 days ago.  She has had sexual intercourse with this partner in the past.  She reports seeing a condom at the beginning but not noticing a condom at the end of sex.  She reports that her partner stated "I took it off and threw it into the trash can".  Patient is not sure about this and would like treatment for PEP.  Also like to be checked for STDs   ROS: See pertinent positives and negatives per HPI.  Past Medical History:  Diagnosis Date   Allergic rhinitis    Anxiety    Bartholin gland cyst 05/04/2013   GERD (gastroesophageal reflux disease)    Migraine without aura, without mention of intractable migraine without mention of status migrainosus 07/09/2013   Ovarian cyst 2014   Polyarthralgia    Sjogren's syndrome ([place])    Sleep apnea 1991   as a child, had surgery at age 58 and no longer has sleep apnea   Vaginal Pap smear, abnormal     Past Surgical History:  Procedure Laterality Date   ADENOIDECTOMY  1993   ESSURE TUBAL LIGATION  07/05/2014   THERAPEUTIC ABORTION     TONSILLECTOMY  1993   TYMPANOSTOMY TUBE PLACEMENT     UPPER GASTROINTESTINAL ENDOSCOPY  04/01/2022    Family History  Problem Relation Age of Onset   Hypertension Mother    Sleep apnea Mother    Obesity Mother    Heart disease Father    Alcohol abuse Father        dying from this    Diabetes Father    High blood pressure Father    Anxiety disorder Father    AAA (abdominal aortic aneurysm) Father    Mental retardation Maternal Uncle    Diabetes Maternal Grandmother    Hypertension Maternal Grandmother    Kidney disease Maternal Grandmother    Heart disease Maternal Grandmother    Stroke Maternal Grandmother    Prostate cancer Maternal Grandfather    Hypertension Paternal Grandmother    Alcohol abuse Other    Colon cancer Neg Hx    Colon polyps Neg Hx    Esophageal cancer Neg Hx    Rectal cancer Neg Hx    Stomach cancer Neg Hx        Current Outpatient Medications:    bictegravir-emtricitabine-tenofovir AF (BIKTARVY) 50-200-25 MG TABS tablet, Take 1 tablet by mouth daily., Disp: 30 tablet, Rfl: 0   cetirizine (ZYRTEC ALLERGY) 10 MG tablet, Take 1 tablet (10 mg total) by mouth daily., Disp: 30 tablet, Rfl: 1   Multiple Vitamin (MULTIVITAMIN) tablet, Take 1 tablet by mouth daily., Disp: , Rfl:    sertraline (ZOLOFT) 25 MG tablet, Take 1 tablet (25 mg total) by mouth daily., Disp: 30 tablet, Rfl: 3  EXAM:  VITALS per patient if applicable:  GENERAL: alert, oriented, appears well and in no acute distress  HEENT: atraumatic, conjunttiva clear,  no obvious abnormalities on inspection of external nose and ears  NECK: normal movements of the head and neck  LUNGS: on inspection no signs of respiratory distress, breathing rate appears normal, no obvious gross SOB, gasping or wheezing  CV: no obvious cyanosis  MS: moves all visible extremities without noticeable abnormality  PSYCH/NEURO: pleasant and cooperative, no obvious depression or anxiety, speech and thought processing grossly intact  ASSESSMENT AND PLAN:  Discussed the following assessment and plan:  1. Unprotected sex  - bictegravir-emtricitabine-tenofovir AF (BIKTARVY) 50-200-25 MG TABS tablet; Take 1 tablet by mouth daily.  Dispense: 30 tablet; Refill: 0  2. Encounter for screening for  HIV  - HIV Antibody (routine testing w rflx); Future  3. Screening for STD (sexually transmitted disease)  - Urine cytology ancillary only; Future     I discussed the assessment and treatment plan with the patient. The patient was provided an opportunity to ask questions and all were answered. The patient agreed with the plan and demonstrated an understanding of the instructions.   The patient was advised to call back or seek an in-person evaluation if the symptoms worsen or if the condition fails to improve as anticipated.   Dorothyann Peng, NP

## 2022-08-04 ENCOUNTER — Other Ambulatory Visit (HOSPITAL_BASED_OUTPATIENT_CLINIC_OR_DEPARTMENT_OTHER): Payer: Self-pay

## 2022-08-04 ENCOUNTER — Other Ambulatory Visit (HOSPITAL_COMMUNITY): Payer: Self-pay

## 2022-08-04 MED ORDER — BIKTARVY 50-200-25 MG PO TABS
1.0000 | ORAL_TABLET | Freq: Every day | ORAL | 0 refills | Status: DC
  Filled 2022-08-04 (×2): qty 30, 30d supply, fill #0

## 2022-08-04 NOTE — Addendum Note (Signed)
Addended by: Apolinar Junes on: 08/04/2022 06:15 AM   Modules accepted: Orders

## 2022-08-10 ENCOUNTER — Other Ambulatory Visit (HOSPITAL_COMMUNITY): Payer: Self-pay

## 2022-08-11 ENCOUNTER — Other Ambulatory Visit (HOSPITAL_COMMUNITY): Payer: Self-pay

## 2022-08-13 ENCOUNTER — Encounter: Payer: Self-pay | Admitting: Plastic Surgery

## 2022-08-13 ENCOUNTER — Ambulatory Visit: Payer: Self-pay | Admitting: Plastic Surgery

## 2022-08-13 VITALS — BP 113/71 | HR 79 | Ht 64.5 in | Wt 175.0 lb

## 2022-08-13 DIAGNOSIS — Z719 Counseling, unspecified: Secondary | ICD-10-CM

## 2022-08-17 ENCOUNTER — Encounter: Payer: Self-pay | Admitting: Plastic Surgery

## 2022-08-17 DIAGNOSIS — Z719 Counseling, unspecified: Secondary | ICD-10-CM | POA: Insufficient documentation

## 2022-08-17 DIAGNOSIS — Z0000 Encounter for general adult medical examination without abnormal findings: Secondary | ICD-10-CM | POA: Insufficient documentation

## 2022-08-17 NOTE — Progress Notes (Signed)
Patient ID: Veronica Raymond, female    DOB: 1986-02-05, 36 y.o.   MRN: [id number]   Chief Complaint  Patient presents with   Consult        Breast Problem    The patient is a 36 year old female here for evaluation of her breasts.  She is 5 feet 4 inches tall and weighs 175 pounds.  She is in good health and is interested in a breast lift and possible implant placement.  She does not have diabetes and is not a smoker.  She has a family history of breast cancer in a maternal aunt.  She had a right mass that turned out to be fatty tissue seen in a mammogram about a year ago.  She has Sojourn's syndrome and anxiety.  She had a hemoglobin A1c at the beginning of the year and it was 5.5.  She takes vitamins regularly.  She has children and feels her breasts have lost volume.  She does have grade 3 ptosis without a lot of breast tissue.    Review of Systems  Constitutional: Negative.   HENT: Negative.    Eyes: Negative.   Respiratory: Negative.  Negative for chest tightness and shortness of breath.   Cardiovascular: Negative.  Negative for leg swelling.  Gastrointestinal: Negative.   Endocrine: Negative.   Genitourinary: Negative.   Musculoskeletal: Negative.     Past Medical History:  Diagnosis Date   Allergic rhinitis    Anxiety    Bartholin gland cyst 05/04/2013   GERD (gastroesophageal reflux disease)    Migraine without aura, without mention of intractable migraine without mention of status migrainosus 07/09/2013   Ovarian cyst 2014   Polyarthralgia    Sjogren's syndrome ([place])    Sleep apnea 1991   as a child, had surgery at age 5 and no longer has sleep apnea   Vaginal Pap smear, abnormal     Past Surgical History:  Procedure Laterality Date   [place]  07/05/2014   THERAPEUTIC ABORTION     TONSILLECTOMY  1993   TYMPANOSTOMY TUBE PLACEMENT     UPPER GASTROINTESTINAL ENDOSCOPY  04/01/2022      Current Outpatient Medications:     bictegravir-emtricitabine-tenofovir AF (BIKTARVY) 50-200-25 MG TABS tablet, Take 1 tablet by mouth daily., Disp: 30 tablet, Rfl: 0   cetirizine (ZYRTEC ALLERGY) 10 MG tablet, Take 1 tablet (10 mg total) by mouth daily., Disp: 30 tablet, Rfl: 1   Multiple Vitamin (MULTIVITAMIN) tablet, Take 1 tablet by mouth daily., Disp: , Rfl:    sertraline (ZOLOFT) 25 MG tablet, Take 1 tablet (25 mg total) by mouth daily., Disp: 30 tablet, Rfl: 3   Objective:   Vitals:   08/13/22 0917  BP: 113/71  Pulse: 79  SpO2: 97%    Physical Exam Vitals and nursing note reviewed.  Constitutional:      Appearance: Normal appearance.  HENT:     Head: Normocephalic and atraumatic.  Cardiovascular:     Rate and Rhythm: Normal rate.     Pulses: Normal pulses.  Pulmonary:     Effort: Pulmonary effort is normal.  Abdominal:     General: There is no distension.     Palpations: Abdomen is soft.  Musculoskeletal:        General: No swelling or deformity.  Skin:    General: Skin is warm.     Capillary Refill: Capillary refill takes less than 2 seconds.  Coloration: Skin is not jaundiced.     Findings: No bruising or lesion.  Neurological:     Mental Status: She is alert and oriented to person, place, and time.  Psychiatric:        Mood and Affect: Mood normal.        Behavior: Behavior normal.        Thought Content: Thought content normal.        Judgment: Judgment normal.     Assessment & Plan:  Encounter for counseling  The patient is interested in a breast lift and possible silicone implant.  We will provide her with a quote.  She can then come in and try some sizes on with a T-shirt and light bra.  I do want her to think about it and be sure she wants to make the commitment for an implant to be placed.  Patient is in agreement with this plan.  Pictures were obtained of the patient and placed in the chart with the patient's or guardian's permission.   [place], DO

## 2022-08-18 ENCOUNTER — Encounter: Payer: Self-pay | Admitting: *Deleted

## 2022-09-16 ENCOUNTER — Telehealth: Payer: Self-pay

## 2022-09-16 NOTE — Telephone Encounter (Signed)
Called patient to follow up on mastopexy and silicone implant quote. VM was full, sent message via MyChart.

## 2022-10-30 DIAGNOSIS — N898 Other specified noninflammatory disorders of vagina: Secondary | ICD-10-CM | POA: Diagnosis not present

## 2022-10-30 DIAGNOSIS — R109 Unspecified abdominal pain: Secondary | ICD-10-CM | POA: Diagnosis not present

## 2022-11-01 DIAGNOSIS — M79642 Pain in left hand: Secondary | ICD-10-CM | POA: Diagnosis not present

## 2022-11-01 DIAGNOSIS — Z6829 Body mass index (BMI) 29.0-29.9, adult: Secondary | ICD-10-CM | POA: Diagnosis not present

## 2022-11-01 DIAGNOSIS — M3501 Sicca syndrome with keratoconjunctivitis: Secondary | ICD-10-CM | POA: Diagnosis not present

## 2022-11-01 DIAGNOSIS — M79641 Pain in right hand: Secondary | ICD-10-CM | POA: Diagnosis not present

## 2022-11-01 DIAGNOSIS — R202 Paresthesia of skin: Secondary | ICD-10-CM | POA: Diagnosis not present

## 2022-11-01 DIAGNOSIS — E663 Overweight: Secondary | ICD-10-CM | POA: Diagnosis not present

## 2022-11-05 ENCOUNTER — Other Ambulatory Visit: Payer: Self-pay

## 2022-11-05 DIAGNOSIS — Z114 Encounter for screening for human immunodeficiency virus [HIV]: Secondary | ICD-10-CM

## 2022-11-05 NOTE — Addendum Note (Signed)
Addended by: Rosalyn Gess D on: 11/05/2022 04:33 PM   Modules accepted: Orders

## 2022-11-06 LAB — HIV ANTIBODY (ROUTINE TESTING W REFLEX): HIV 1&2 Ab, 4th Generation: NONREACTIVE

## 2022-11-06 IMAGING — MR MR ABDOMEN WO/W CM
11 of 17 series · 28 of 48 positions shown · IV contrast (multihance)
Comparison: Ultrasound August 13, 2021

CLINICAL DATA: Further evaluation of possible hepatic lesion seen
on prior ultrasound.

EXAM:
MRI ABDOMEN WITHOUT AND WITH CONTRAST
TECHNIQUE: Multiplanar multisequence MR imaging of the abdomen was performed
both before and after the administration of intravenous contrast.
CONTRAST:  16mL MULTIHANCE GADOBENATE DIMEGLUMINE 529 MG/ML IV SOLN

[Series 3: cor haste · coronal · 5.0mm · 0.68mm/px · 2 of 30 slices shown]
[im 1/30]
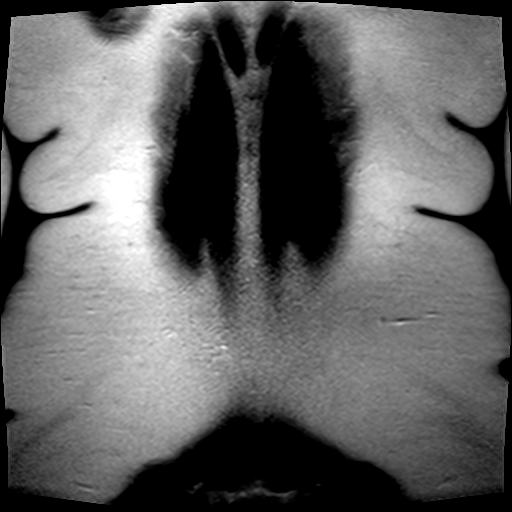
[im 30/30]
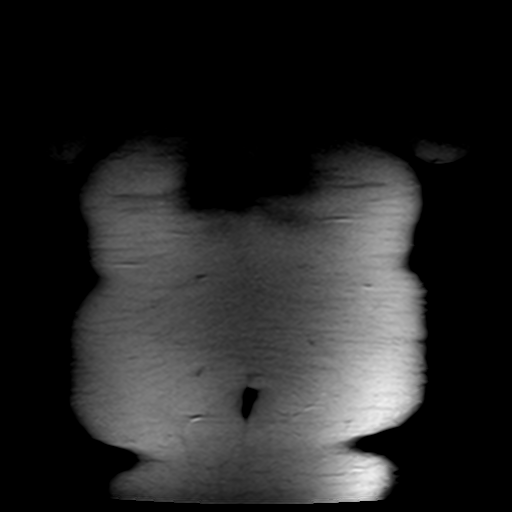

[Series 4: axial haste · axial · 6.0mm · 0.68mm/px · z∈[-6,+206]mm · 2 of 33 slices shown]
[im 1/33]
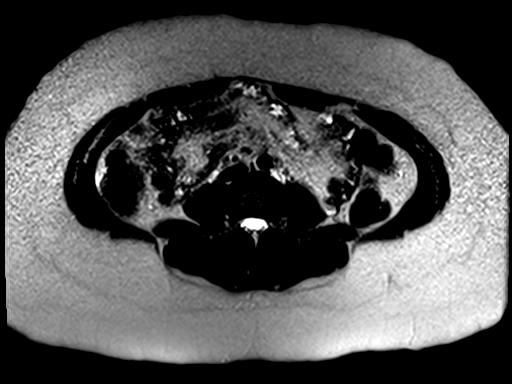
[im 33/33]
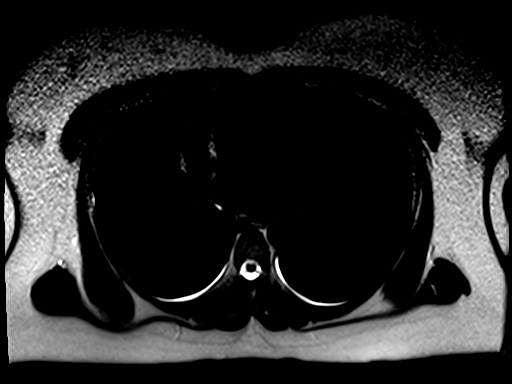

[Series 5: T1 · axial · 6.0mm · 0.68mm/px · z∈[-6,+206]mm · 4 of 66 slices shown]
[im 1/66]
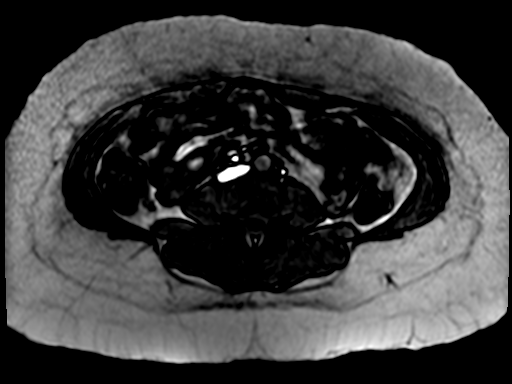
[im 22/66]
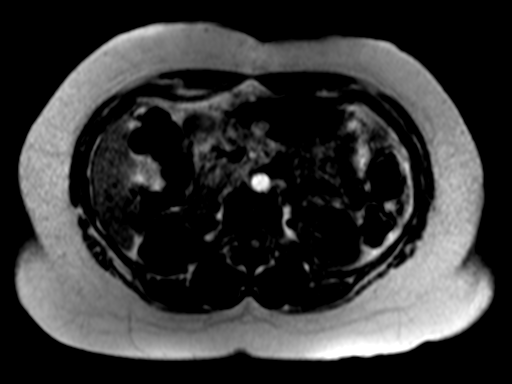
[im 44/66]
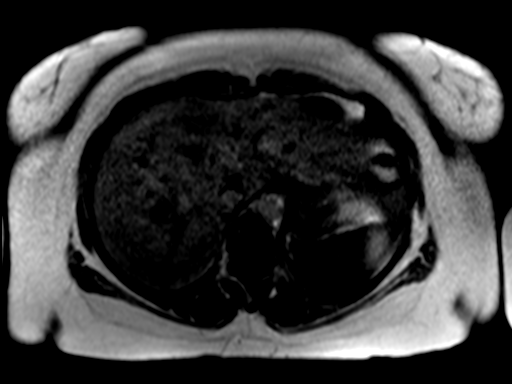
[im 66/66]
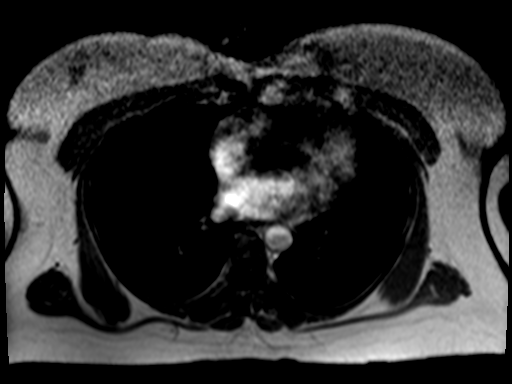

[Series 6: bSSFP · axial · 4.0mm · 0.68mm/px · z∈[-14,+214]mm · 3 of 58 slices shown]
[im 1/58]
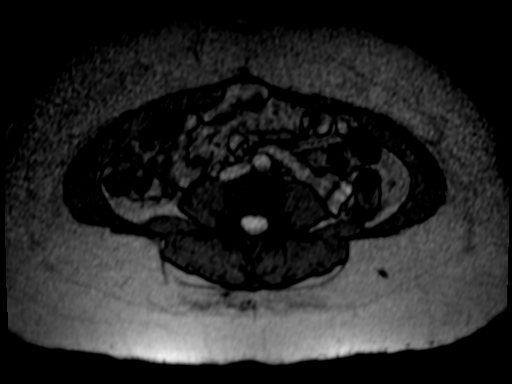
[im 29/58]
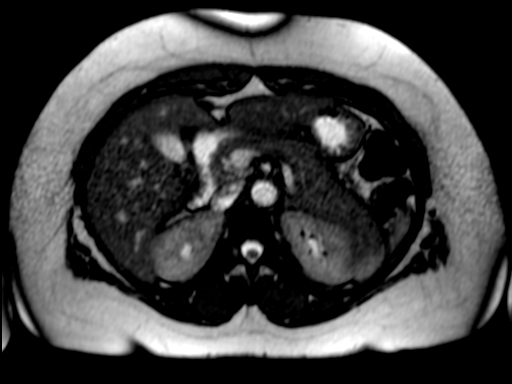
[im 58/58]
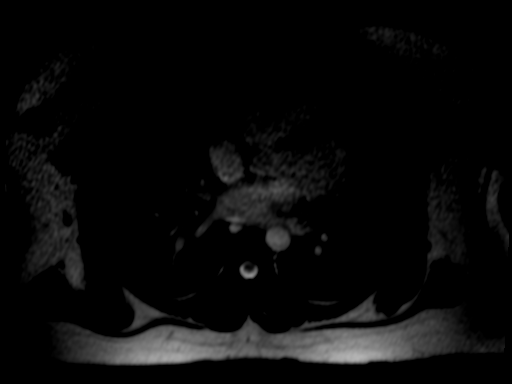

[Series 7: T2 fat-sat · axial · 6.0mm · 1.09mm/px · z∈[-22,+202]mm · 2 of 32 slices shown]
[im 1/32]
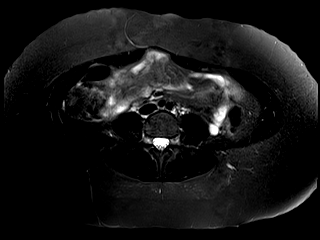
[im 32/32]
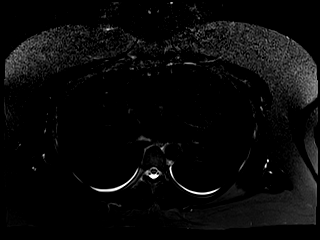

[Series 8: ep2d_diff_b50_500_800_p2_trig · axial · 6.0mm · 1.82mm/px · z∈[-29,+209]mm · 4 of 102 slices shown]
[im 1/102]
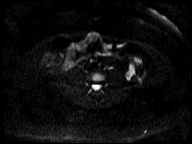
[im 34/102]
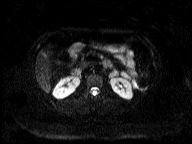
[im 68/102]
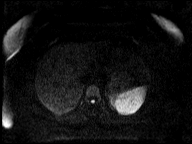
[im 102/102]
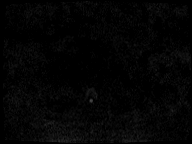

[Series 9: ep2d_diff_b50_500_800_p2_trig_adc · axial · 6.0mm · 1.82mm/px · 1 of 34 slices shown]
[im 1/34]
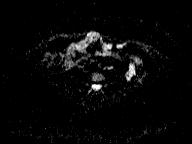

[Series 10: T1 dynamic · axial · non-contrast · 2.5mm · 0.74mm/px · z∈[-9,+209]mm · 3 of 88 slices shown]
[im 1/88]
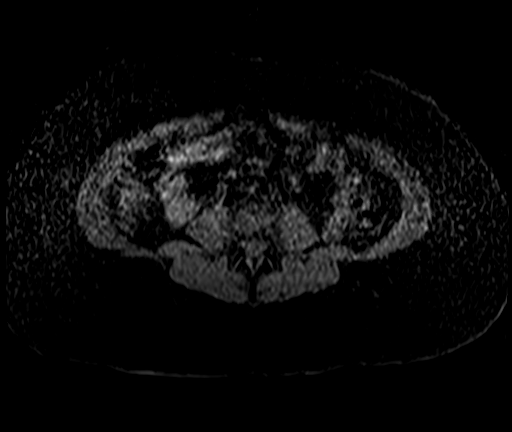
[im 44/88]
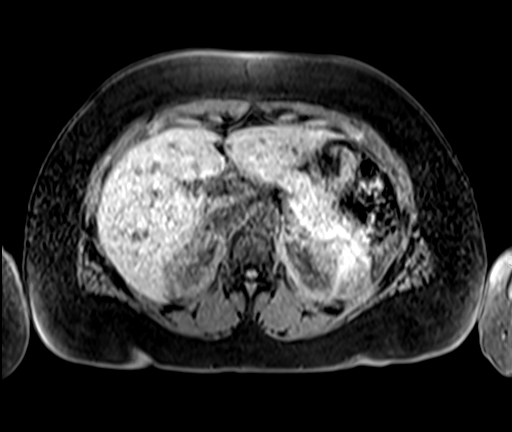
[im 88/88]
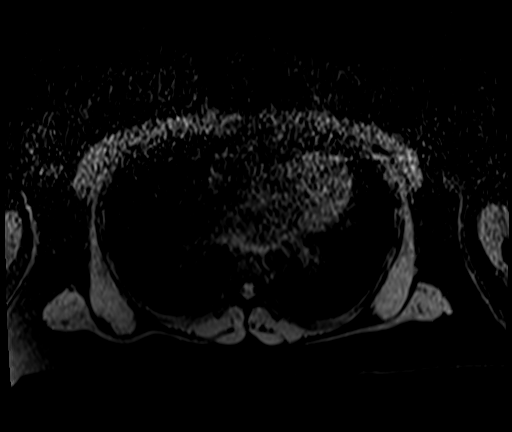

[Series 11: T1 dynamic post-contrast · axial · 2.5mm · 0.74mm/px · z∈[-9,+209]mm · 3 of 88 slices shown (1 of 3)]
[im 1/88]
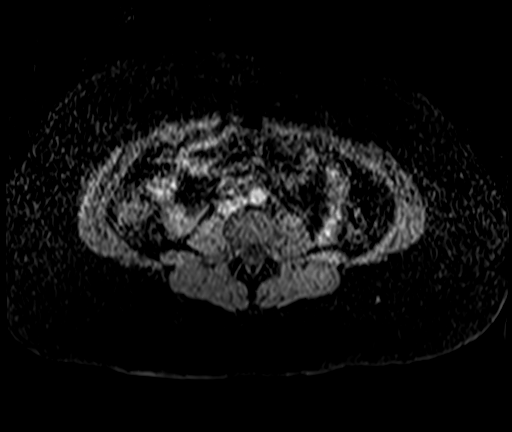
[im 44/88]
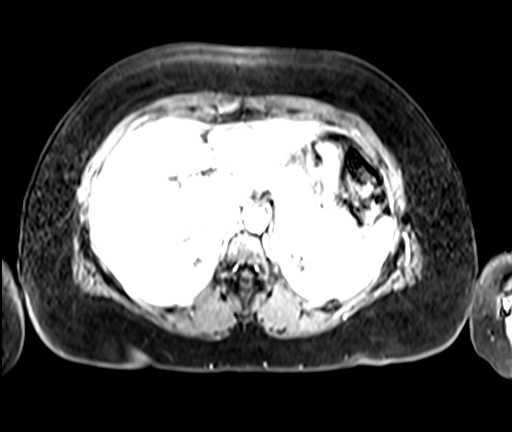
[im 88/88]
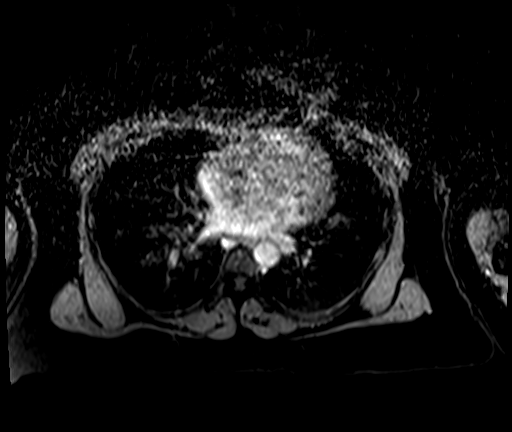

[Series 12: T1 dynamic post-contrast · axial · 2.5mm · 0.74mm/px · z∈[-9,+209]mm · 3 of 88 slices shown (2 of 3)]
[im 1/88]
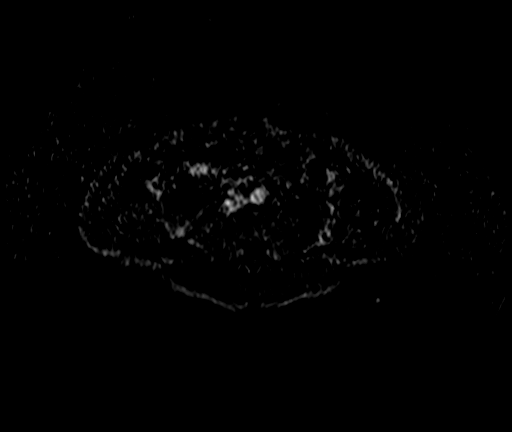
[im 44/88]
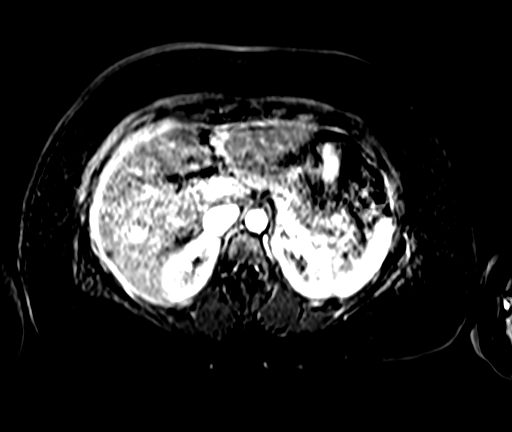
[im 88/88]
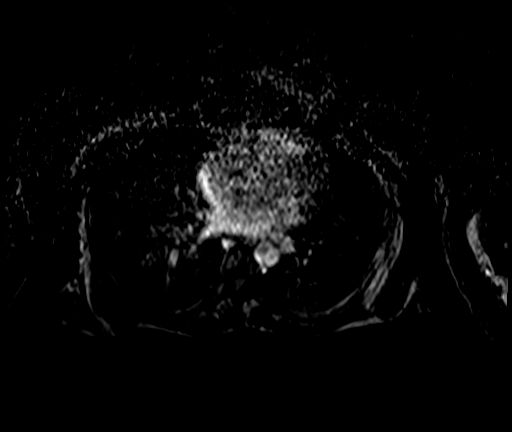

[Series 13: T1 dynamic post-contrast · axial · 2.5mm · 0.74mm/px · 1 of 88 slices shown (3 of 3)]
[im 1/88]
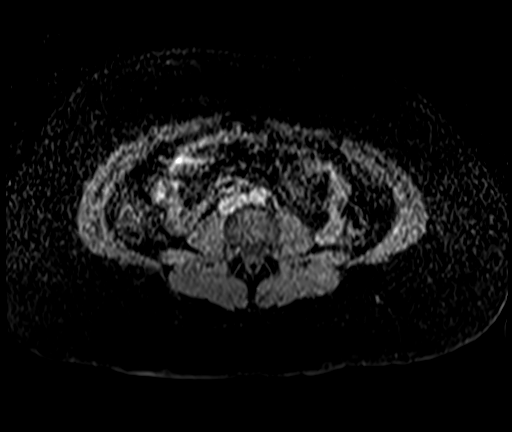

[28 of 48 positions shown; findings below may reference images not displayed]

FINDINGS: Lower chest: No acute abnormality.

Hepatobiliary: 9 mm T2 hyperintense T1 hypointense lesion in the
posterior peripheral aspect of the right lobe of the liver on image
[DATE] which does not demonstrate early arterial enhancement but does
demonstrates delayed peripheral nodular discontinuous enhancement on
image 42/18. Gallbladder is unremarkable. No biliary ductal
dilation.

Pancreas: Intrinsic T1 signal of the pancreatic parenchyma is within
normal limits. No pancreatic ductal dilation. No cystic or solid
hyperenhancing pancreatic lesion identified.

Spleen:  Within normal limits in size and appearance.

Adrenals/Urinary Tract: No masses identified. No evidence of
hydronephrosis.

Stomach/Bowel: Visualized portions within the abdomen are
unremarkable.

Vascular/Lymphatic: No pathologically enlarged lymph nodes
identified. No abdominal aortic aneurysm demonstrated.

Other:  No abdominal ascites.

Musculoskeletal: No suspicious bone lesions identified.
IMPRESSION: Corresponding with the lesion seen on prior ultrasound there is a 9
mm lesion in the right lobe of the liver, evaluation of which is
slightly limited by respiratory motion, but demonstrates imaging
characteristics most consistent with a benign hepatic hemangioma.

## 2022-12-10 ENCOUNTER — Encounter (HOSPITAL_COMMUNITY): Payer: Self-pay

## 2022-12-10 ENCOUNTER — Ambulatory Visit (HOSPITAL_COMMUNITY)
Admission: EM | Admit: 2022-12-10 | Discharge: 2022-12-10 | Disposition: A | Discharge status: Home / Self Care | Payer: 59

## 2022-12-10 DIAGNOSIS — K2100 Gastro-esophageal reflux disease with esophagitis, without bleeding: Secondary | ICD-10-CM | POA: Diagnosis not present

## 2022-12-10 MED ORDER — ALUM & MAG HYDROXIDE-SIMETH 200-200-20 MG/5ML PO SUSP
ORAL | Status: AC
  Filled 2022-12-10: qty 30

## 2022-12-10 MED ORDER — LIDOCAINE VISCOUS HCL 2 % MT SOLN
15.0000 mL | Freq: Once | OROMUCOSAL | Status: AC
  Administered 2022-12-10: 15 mL via OROMUCOSAL

## 2022-12-10 MED ORDER — LIDOCAINE VISCOUS HCL 2 % MT SOLN
OROMUCOSAL | Status: AC
  Filled 2022-12-10: qty 15

## 2022-12-10 MED ORDER — ALUM & MAG HYDROXIDE-SIMETH 200-200-20 MG/5ML PO SUSP
30.0000 mL | Freq: Once | ORAL | Status: AC
  Administered 2022-12-10: 30 mL via ORAL

## 2022-12-10 NOTE — ED Triage Notes (Signed)
Back ain in the mid upper back and acid reflux. Onset 1.5 days. Thinks she pulled a muscle while changing the smoke detector.

## 2022-12-10 NOTE — ED Provider Notes (Signed)
[facility]    CSN: [id number] Arrival date & time: 12/10/22  1918      History   Chief Complaint Chief Complaint  Patient presents with   Back Pain   Gastroesophageal Reflux    HPI Veronica Raymond is a 37 y.o. female.   Takes pantoprazole but doesn't take daily If she forgets to take it she'll take pepcid at work Symptoms started yesterday and got worse today When swallowing with eating she had some pain in her chest Eating a lot of fast food First time it's been upper and in the chest No heart palpitations Tums helps sometimes Was on workout and diet plan and didn't have to take protonix for awhile but got off track and has been eating fast food more over the last 2 months    Back Pain Gastroesophageal Reflux    Past Medical History:  Diagnosis Date   Allergic rhinitis    Anxiety    Bartholin gland cyst 05/04/2013   GERD (gastroesophageal reflux disease)    Migraine without aura, without mention of intractable migraine without mention of status migrainosus 07/09/2013   Ovarian cyst 2014   Polyarthralgia    Sjogren's syndrome ([place])    Sleep apnea 1991   as a child, had surgery at age 64 and no longer has sleep apnea   Vaginal Pap smear, abnormal     Patient Active Problem List   Diagnosis Date Noted   Encounter for counseling 08/17/2022   Herpes labialis without complication [id number]   Fibromyalgia 06/24/2020   Sjogren's disease ([place]) 06/24/2020   Vitamin D deficiency 08/20/2019   Class 1 obesity due to excess calories without serious comorbidity with body mass index (BMI) of 31.0 to 31.9 in adult 08/16/2019   Polyarthralgia 03/07/2019   History of abnormal cervical Pap smear 01/28/2018   Allergic rhinitis    GERD (gastroesophageal reflux disease)    Migraine without aura 07/09/2013    Past Surgical History:  Procedure Laterality Date   ADENOIDECTOMY  1993   ESSURE TUBAL LIGATION  07/05/2014   THERAPEUTIC ABORTION     TONSILLECTOMY   1993   TYMPANOSTOMY TUBE PLACEMENT     UPPER GASTROINTESTINAL ENDOSCOPY  04/01/2022    OB History     Gravida  5   Para  4   Term  4   Preterm      AB  1   Living  4      SAB      IAB  1   Ectopic      Multiple      Live Births  4            Home Medications    Prior to Admission medications   Medication Sig Start Date End Date Taking? Authorizing Provider  cetirizine (ZYRTEC ALLERGY) 10 MG tablet Take 1 tablet (10 mg total) by mouth daily. 09/13/20  Yes Volney American, PA-C  Multiple Vitamin (MULTIVITAMIN) tablet Take 1 tablet by mouth daily.   Yes [provider]  pantoprazole (PROTONIX) 20 MG tablet Take 20 mg by mouth daily.   Yes [provider]  bictegravir-emtricitabine-tenofovir AF (BIKTARVY) 50-200-25 MG TABS tablet Take 1 tablet by mouth daily. 08/04/22   Nafziger, Tommi Rumps, NP  sertraline (ZOLOFT) 25 MG tablet Take 1 tablet (25 mg total) by mouth daily. 04/02/22   [place], Betty G, MD    Family History Family History  Problem Relation Age of Onset   Hypertension Mother  Sleep apnea Mother    Obesity Mother    Heart disease Father    Alcohol abuse Father        dying from this   Diabetes Father    High blood pressure Father    Anxiety disorder Father    AAA (abdominal aortic aneurysm) Father    Mental retardation Maternal Uncle    Diabetes Maternal Grandmother    Hypertension Maternal Grandmother    Kidney disease Maternal Grandmother    Heart disease Maternal Grandmother    Stroke Maternal Grandmother    Prostate cancer Maternal Grandfather    Hypertension Paternal Grandmother    Alcohol abuse Other    Colon cancer Neg Hx    Colon polyps Neg Hx    Esophageal cancer Neg Hx    Rectal cancer Neg Hx    Stomach cancer Neg Hx     Social History Social History   Tobacco Use   Smoking status: Never   Smokeless tobacco: Never  Vaping Use   Vaping Use: Never used  Substance Use Topics   Alcohol use: Yes     Comment: couple drinks per month   Drug use: Never     Allergies   Decongestant [pseudoephedrine], Iodinated contrast media, Percocet [oxycodone-acetaminophen], Flonase [fluticasone], Iodine, Iohexol, and Sulfa antibiotics   Review of Systems Review of Systems  Musculoskeletal:  Positive for back pain.     Physical Exam Triage Vital Signs ED Triage Vitals  Enc Vitals Group     BP 12/10/22 1950 113/79     Pulse Rate 12/10/22 1950 73     Resp 12/10/22 1950 16     Temp 12/10/22 1950 98.5 F (36.9 C)     Temp Source 12/10/22 1950 Oral     SpO2 12/10/22 1950 98 %     Weight 12/10/22 1950 181 lb 3.2 oz (82.2 kg)     Height 12/10/22 1950 5\' 4"  (1.626 m)     Head Circumference --      Peak Flow --      Pain Score 12/10/22 1948 7     Pain Loc --      Pain Edu? --      Excl. in [place]? --    No data found.  Updated Vital Signs BP 113/79 (BP Location: Right Arm)   Pulse 73   Temp 98.5 F (36.9 C) (Oral)   Resp 16   Ht 5\' 4"  (1.626 m)   Wt 181 lb 3.2 oz (82.2 kg)   LMP 11/16/2022 (Exact Date)   SpO2 98%   BMI 31.10 kg/m   Visual Acuity Right Eye Distance:   Left Eye Distance:   Bilateral Distance:    Right Eye Near:   Left Eye Near:    Bilateral Near:     Physical Exam   UC Treatments / Results  Labs (all labs ordered are listed, but only abnormal results are displayed) Labs Reviewed - No data to display  EKG   Radiology No results found.  Procedures Procedures (including critical care time)  Medications Ordered in UC Medications - No data to display  Initial Impression / Assessment and Plan / UC Course  I have reviewed the triage vital signs and the nursing notes.  Pertinent labs & imaging results that were available during my care of the patient were reviewed by me and considered in my medical decision making (see chart for details).     *** Final Clinical Impressions(s) / UC Diagnoses   Final  diagnoses:  None   Discharge Instructions    None    ED Prescriptions   None    PDMP not reviewed this encounter.

## 2022-12-10 NOTE — Discharge Instructions (Addendum)
Take prescribed medicines as directed. Medicine will help reduce the amount of acid your stomach makes and therefore improve your reflux symptoms related to acid production.   Avoid spicy or acidic foods like tomatoes, chocolate, coffee, or acidic fruits like oranges as these can trigger symptoms.  I have included acid reflux education in your packet for your review. Please also allow 2 hours after meals before lying flat to help prevent symptoms.   If your symptoms do not improve in the next 5-7 days with interventions, please return. Go to the emergency room for severe symptoms of shortness of breath, worsening or uncontrolled abdominal or chest pain, headache, light headedness, feeling faint, nausea, vomiting, bloody vomit or stools, black tarry stools, or any other new/severe symptoms. I hope you feel better!

## 2023-04-29 ENCOUNTER — Ambulatory Visit (INDEPENDENT_AMBULATORY_CARE_PROVIDER_SITE_OTHER): Payer: 59 | Admitting: Family Medicine

## 2023-04-29 ENCOUNTER — Encounter: Payer: Self-pay | Admitting: Family Medicine

## 2023-04-29 VITALS — BP 110/70 | HR 63 | Resp 12 | Ht 64.0 in | Wt 193.0 lb

## 2023-04-29 DIAGNOSIS — Z130 Encounter for screening for diseases of the blood and blood-forming organs and certain disorders involving the immune mechanism: Secondary | ICD-10-CM

## 2023-04-29 DIAGNOSIS — Z0000 Encounter for general adult medical examination without abnormal findings: Secondary | ICD-10-CM

## 2023-04-29 DIAGNOSIS — Z13228 Encounter for screening for other metabolic disorders: Secondary | ICD-10-CM

## 2023-04-29 DIAGNOSIS — E785 Hyperlipidemia, unspecified: Secondary | ICD-10-CM | POA: Diagnosis not present

## 2023-04-29 DIAGNOSIS — Z13220 Encounter for screening for lipoid disorders: Secondary | ICD-10-CM | POA: Diagnosis not present

## 2023-04-29 DIAGNOSIS — E559 Vitamin D deficiency, unspecified: Secondary | ICD-10-CM | POA: Diagnosis not present

## 2023-04-29 DIAGNOSIS — Z1329 Encounter for screening for other suspected endocrine disorder: Secondary | ICD-10-CM

## 2023-04-29 DIAGNOSIS — E538 Deficiency of other specified B group vitamins: Secondary | ICD-10-CM | POA: Diagnosis not present

## 2023-04-29 DIAGNOSIS — M3500 Sicca syndrome, unspecified: Secondary | ICD-10-CM | POA: Diagnosis not present

## 2023-04-29 LAB — COMPREHENSIVE METABOLIC PANEL
ALT: 23 U/L (ref 0–35)
AST: 20 U/L (ref 0–37)
Albumin: 4.1 g/dL (ref 3.5–5.2)
Alkaline Phosphatase: 47 U/L (ref 39–117)
BUN: 15 mg/dL (ref 6–23)
CO2: 26 mEq/L (ref 19–32)
Calcium: 9.2 mg/dL (ref 8.4–10.5)
Chloride: 103 mEq/L (ref 96–112)
Creatinine, Ser: 0.81 mg/dL (ref 0.40–1.20)
GFR: 93.64 mL/min (ref >60.00)
Glucose, Bld: 70 mg/dL (ref 70–99)
Potassium: 3.8 mEq/L (ref 3.5–5.1)
Sodium: 137 mEq/L (ref 135–145)
Total Bilirubin: 0.6 mg/dL (ref 0.2–1.2)
Total Protein: 7.5 g/dL (ref 6.0–8.3)

## 2023-04-29 LAB — CBC WITH DIFFERENTIAL/PLATELET
Basophils Absolute: 0 10*3/uL (ref 0.0–0.1)
Basophils Relative: 0.4 % (ref 0.0–3.0)
Eosinophils Absolute: 0.1 10*3/uL (ref 0.0–0.7)
Eosinophils Relative: 1 % (ref 0.0–5.0)
HCT: 37.3 % (ref 36.0–46.0)
Hemoglobin: 12.1 g/dL (ref 12.0–15.0)
Lymphocytes Relative: 50.3 % — ABNORMAL HIGH (ref 12.0–46.0)
Lymphs Abs: 3.2 10*3/uL (ref 0.7–4.0)
MCHC: 32.5 g/dL (ref 30.0–36.0)
MCV: 93.6 fl (ref 78.0–100.0)
Monocytes Absolute: 0.5 10*3/uL (ref 0.1–1.0)
Monocytes Relative: 7.7 % (ref 3.0–12.0)
Neutro Abs: 2.6 10*3/uL (ref 1.4–7.7)
Neutrophils Relative %: 40.6 % — ABNORMAL LOW (ref 43.0–77.0)
Platelets: 384 10*3/uL (ref 150.0–400.0)
RBC: 3.98 Mil/uL (ref 3.87–5.11)
RDW: 12.7 % (ref 11.5–15.5)
WBC: 6.4 10*3/uL (ref 4.0–10.5)

## 2023-04-29 LAB — LIPID PANEL
Cholesterol: 201 mg/dL — ABNORMAL HIGH (ref 0–200)
HDL: 58.7 mg/dL (ref >39.00)
LDL Cholesterol: 120 mg/dL — ABNORMAL HIGH (ref 0–99)
NonHDL: 142.61
Total CHOL/HDL Ratio: 3
Triglycerides: 112 mg/dL (ref 0.0–149.0)
VLDL: 22.4 mg/dL (ref 0.0–40.0)

## 2023-04-29 LAB — VITAMIN D 25 HYDROXY (VIT D DEFICIENCY, FRACTURES): VITD: 17.27 ng/mL — ABNORMAL LOW (ref 30.00–100.00)

## 2023-04-29 LAB — VITAMIN B12: Vitamin B-12: 383 pg/mL (ref 211–911)

## 2023-04-29 NOTE — Patient Instructions (Addendum)
A few things to remember from today's visit:  Routine general medical examination at a health care facility - Plan: CMP, Vitamin B12, Vitamin B12, CMP  Screening for endocrine, metabolic and immunity disorder - Plan: CBC with Differential/Platelets, CMP, Vitamin B12, Vitamin B12, CMP, CBC with Differential/Platelets  Vitamin D deficiency - Plan: Vitamin D, 25-hydroxy, Vitamin B12, Vitamin B12, Vitamin D, 25-hydroxy  Lipid screening - Plan: Lipid Panel, Lipid Panel  B12 2000 mcg once per week. Vit D 5000 daily for 30 days then 2000 U daily.  If you send a my chart message, it may take a few days to be addressed, specially if I am not in the office.  Please be sure medication list is accurate. If a new problem present, please set up appointment sooner than planned today.  Health Maintenance, Female Adopting a healthy lifestyle and getting preventive care are important in promoting health and wellness. Ask your health care provider about: The right schedule for you to have regular tests and exams. Things you can do on your own to prevent diseases and keep yourself healthy. What should I know about diet, weight, and exercise? Eat a healthy diet  Eat a diet that includes plenty of vegetables, fruits, low-fat dairy products, and lean protein. Do not eat a lot of foods that are high in solid fats, added sugars, or sodium. Maintain a healthy weight Body mass index (BMI) is used to identify weight problems. It estimates body fat based on height and weight. Your health care provider can help determine your BMI and help you achieve or maintain a healthy weight. Get regular exercise Get regular exercise. This is one of the most important things you can do for your health. Most adults should: Exercise for at least 150 minutes each week. The exercise should increase your heart rate and make you sweat (moderate-intensity exercise). Do strengthening exercises at least twice a week. This is in  addition to the moderate-intensity exercise. Spend less time sitting. Even light physical activity can be beneficial. Watch cholesterol and blood lipids Have your blood tested for lipids and cholesterol at 36 years of age, then have this test every 5 years. Have your cholesterol levels checked more often if: Your lipid or cholesterol levels are high. You are older than 36 years of age. You are at high risk for heart disease. What should I know about cancer screening? Depending on your health history and family history, you may need to have cancer screening at various ages. This may include screening for: Breast cancer. Cervical cancer. Colorectal cancer. Skin cancer. Lung cancer. What should I know about heart disease, diabetes, and high blood pressure? Blood pressure and heart disease High blood pressure causes heart disease and increases the risk of stroke. This is more likely to develop in people who have high blood pressure readings or are overweight. Have your blood pressure checked: Every 3-5 years if you are 14-61 years of age. Every year if you are 15 years old or older. Diabetes Have regular diabetes screenings. This checks your fasting blood sugar level. Have the screening done: Once every three years after age 8 if you are at a normal weight and have a low risk for diabetes. More often and at a younger age if you are overweight or have a high risk for diabetes. What should I know about preventing infection? Hepatitis B If you have a higher risk for hepatitis B, you should be screened for this virus. Talk with your health care provider to  find out if you are at risk for hepatitis B infection. Hepatitis C Testing is recommended for: Everyone born from [birth year] through 1964. Anyone with known risk factors for hepatitis C. Sexually transmitted infections (STIs) Get screened for STIs, including gonorrhea and chlamydia, if: You are sexually active and are younger than 36 years of  age. You are older than 36 years of age and your health care provider tells you that you are at risk for this type of infection. Your sexual activity has changed since you were last screened, and you are at increased risk for chlamydia or gonorrhea. Ask your health care provider if you are at risk. Ask your health care provider about whether you are at high risk for HIV. Your health care provider may recommend a prescription medicine to help prevent HIV infection. If you choose to take medicine to prevent HIV, you should first get tested for HIV. You should then be tested every 3 months for as long as you are taking the medicine. Pregnancy If you are about to stop having your period (premenopausal) and you may become pregnant, seek counseling before you get pregnant. Take 400 to 800 micrograms (mcg) of folic acid every day if you become pregnant. Ask for birth control (contraception) if you want to prevent pregnancy. Osteoporosis and menopause Osteoporosis is a disease in which the bones lose minerals and strength with aging. This can result in bone fractures. If you are 24 years old or older, or if you are at risk for osteoporosis and fractures, ask your health care provider if you should: Be screened for bone loss. Take a calcium or vitamin D supplement to lower your risk of fractures. Be given hormone replacement therapy (HRT) to treat symptoms of menopause. Follow these instructions at home: Alcohol use Do not drink alcohol if: Your health care provider tells you not to drink. You are pregnant, may be pregnant, or are planning to become pregnant. If you drink alcohol: Limit how much you have to: 0-1 drink a day. Know how much alcohol is in your drink. In the [place] one drink equals one 12 oz bottle of beer (355 mL), one 5 oz glass of wine (148 mL), or one 1 oz glass of hard liquor (44 mL). Lifestyle Do not use any products that contain nicotine or tobacco. These products include  cigarettes, chewing tobacco, and vaping devices, such as e-cigarettes. If you need help quitting, ask your health care provider. Do not use street drugs. Do not share needles. Ask your health care provider for help if you need support or information about quitting drugs. General instructions Schedule regular health, dental, and eye exams. Stay current with your vaccines. Tell your health care provider if: You often feel depressed. You have ever been abused or do not feel safe at home. Summary Adopting a healthy lifestyle and getting preventive care are important in promoting health and wellness. Follow your health care provider's instructions about healthy diet, exercising, and getting tested or screened for diseases. Follow your health care provider's instructions on monitoring your cholesterol and blood pressure. This information is not intended to replace advice given to you by your health care provider. Make sure you discuss any questions you have with your health care provider. Document Revised: 02/02/2021 Document Reviewed: 02/02/2021 Elsevier Patient Education  2024 [employer].

## 2023-04-29 NOTE — Assessment & Plan Note (Signed)
We discussed the importance of regular physical activity and healthy diet for prevention of chronic illness and/or complications. Preventive guidelines reviewed. Vaccination up-to-date. Continue her female preventive care with gynecologist. Next CPE in a year. 

## 2023-04-29 NOTE — Assessment & Plan Note (Signed)
Problem is stable. She is no longer following with rheumatologist.

## 2023-04-29 NOTE — Assessment & Plan Note (Signed)
She cannot not afford ergocalciferol 50,000 units, which was prescribed by her rheumatologist. Recommend OTC vitamin D 5000 units daily for 30 days then continue with 2000 units daily.

## 2023-04-29 NOTE — Progress Notes (Signed)
HPI: [name] is a 37 y.o. female with PMHx significant for Fibromyalgia,polyarthralgia, migraine headaches, B12 def,vit D def, Sjogren's disease,and seasonal allergies here today for her routine physical.  Last CPE: 04/16/22 Last visit for gynecologic preventive care with her gyn on 04/16/23.  She reports a recent weight gain of 30 pounds due to stress from purchasing a house and a lack of time for exercise and proper diet since March. She sleeps an average of four to five hours per night.  She denies smoking but admits to consuming alcohol, specifically five ounces of vodka mixed with juice weekends.  Immunization History  Administered Date(s) Administered   Influenza-Unspecified 08/27/2017, 07/12/2019   Health Maintenance  Topic Date Due   DTaP/Tdap/Td (1 - Tdap) Never done   COVID-19 Vaccine (1 - 2023-24 season) Never done   INFLUENZA VACCINE  04/28/2023   PAP SMEAR-Modifier  04/15/2025   Hepatitis C Screening  Completed   HIV Screening  Completed   HPV VACCINES  Aged Out   She had labs earlier today. HLD on non pharmacologic treatment. Lab Results  Component Value Date   CHOL 201 (H) 04/29/2023   HDL 58.70 04/29/2023   LDLCALC 120 (H) 04/29/2023   TRIG 112.0 04/29/2023   CHOLHDL 3 04/29/2023   Lab Results  Component Value Date   ALT 23 04/29/2023   AST 20 04/29/2023   ALKPHOS 47 04/29/2023   BILITOT 0.6 04/29/2023   Lab Results  Component Value Date   NA 137 04/29/2023   CL 103 04/29/2023   K 3.8 04/29/2023   CO2 26 04/29/2023   BUN 15 04/29/2023   CREATININE 0.81 04/29/2023   GFR 93.64 04/29/2023   CALCIUM 9.2 04/29/2023   ALBUMIN 4.1 04/29/2023   GLUCOSE 70 04/29/2023   B12 def: She is not on B12 supplementation. Lab Results  Component Value Date   VITAMINB12 383 04/29/2023   Vit D deficiency: She stopped vit D supplementation last month, could not afford Ergocalciferol 50,000 U, which was last prescribed by her rheumatologist.  25 OH  vit D low at 41.  Fibromyalgia and Sjogren synd: She is not longer seeing rheumatologist. Lab Results  Component Value Date   WBC 6.4 04/29/2023   HGB 12.1 04/29/2023   HCT 37.3 04/29/2023   MCV 93.6 04/29/2023   PLT 384.0 04/29/2023   She experiences itching on the bottom of her feet at night for the past three years and has tried using lotion after showering without relief. She has not noted rash.  Review of Systems  Constitutional:  Negative for activity change, appetite change and fever.  HENT:  Negative for hearing loss, mouth sores, sore throat and trouble swallowing.   Eyes:  Negative for redness and visual disturbance.  Respiratory:  Negative for cough, shortness of breath and wheezing.   Cardiovascular:  Negative for chest pain and leg swelling.  Gastrointestinal:  Negative for abdominal pain, nausea and vomiting.       No changes in bowel habits.  Endocrine: Negative for cold intolerance, heat intolerance, polydipsia, polyphagia and polyuria.  Genitourinary:  Negative for decreased urine volume, dysuria and hematuria.  Musculoskeletal:  Positive for arthralgias and myalgias. Negative for gait problem.  Skin:  Negative for color change and rash.  Allergic/Immunologic: Positive for environmental allergies.  Neurological:  Negative for syncope, weakness and headaches.  Hematological:  Negative for adenopathy. Does not bruise/bleed easily.  Psychiatric/Behavioral:  Negative for confusion and hallucinations.   All other systems reviewed and  are negative.  Current Outpatient Medications on File Prior to Visit  Medication Sig Dispense Refill   bictegravir-emtricitabine-tenofovir AF (BIKTARVY) 50-200-25 MG TABS tablet Take 1 tablet by mouth daily. 30 tablet 0   cetirizine (ZYRTEC ALLERGY) 10 MG tablet Take 1 tablet (10 mg total) by mouth daily. 30 tablet 1   Multiple Vitamin (MULTIVITAMIN) tablet Take 1 tablet by mouth daily.     pantoprazole (PROTONIX) 20 MG tablet Take 20 mg  by mouth daily.     sertraline (ZOLOFT) 25 MG tablet Take 1 tablet (25 mg total) by mouth daily. 30 tablet 3   No current facility-administered medications on file prior to visit.   Past Medical History:  Diagnosis Date   Allergic rhinitis    Anxiety    Bartholin gland cyst 05/04/2013   GERD (gastroesophageal reflux disease)    Migraine without aura, without mention of intractable migraine without mention of status migrainosus 07/09/2013   Ovarian cyst 2014   Polyarthralgia    Sjogren's syndrome (HCC)    Sleep apnea 1991   as a child, had surgery at age 74 and no longer has sleep apnea   Vaginal Pap smear, abnormal    Past Surgical History:  Procedure Laterality Date   ADENOIDECTOMY  1993   ESSURE TUBAL LIGATION  07/05/2014   THERAPEUTIC ABORTION     TONSILLECTOMY  1993   TYMPANOSTOMY TUBE PLACEMENT     UPPER GASTROINTESTINAL ENDOSCOPY  04/01/2022   Allergies  Allergen Reactions   Decongestant [Pseudoephedrine] Other (See Comments)    SI    Iodinated Contrast Media Hives   Percocet [Oxycodone-Acetaminophen] Itching   Flonase [Fluticasone]     Triggers migraines   Iodine Hives and Itching   Iohexol Hives and Itching   Sulfa Antibiotics Hives and Itching    Family History  Problem Relation Age of Onset   Hypertension Mother    Sleep apnea Mother    Obesity Mother    Heart disease Father    Alcohol abuse Father        dying from this   Diabetes Father    High blood pressure Father    Anxiety disorder Father    AAA (abdominal aortic aneurysm) Father    Mental retardation Maternal Uncle    Diabetes Maternal Grandmother    Hypertension Maternal Grandmother    Kidney disease Maternal Grandmother    Heart disease Maternal Grandmother    Stroke Maternal Grandmother    Prostate cancer Maternal Grandfather    Hypertension Paternal Grandmother    Alcohol abuse Other    Colon cancer Neg Hx    Colon polyps Neg Hx    Esophageal cancer Neg Hx    Rectal cancer Neg Hx     Stomach cancer Neg Hx     Social History   Socioeconomic History   Marital status: Significant Other    Spouse name: Not on file   Number of children: 3   Years of education: college   Highest education level: Not on file  Occupational History   Occupation: CMA    Employer: [employer]    Comment: Adult nurse at [employer]  Tobacco Use   Smoking status: Never   Smokeless tobacco: Never  Vaping Use   Vaping status: Never Used  Substance and Sexual Activity   Alcohol use: Yes    Comment: couple drinks per month   Drug use: Never   Sexual activity: Yes    Birth control/protection: Surgical  Other Topics Concern  Not on file  Social History Narrative   Lives with her 4 children      Lives in two story home      Right handed      Highest level of edu- [employer]      Social Determinants of Health   Financial Resource Strain: Not on file  Food Insecurity: Not on file  Transportation Needs: Not on file  Physical Activity: Not on file  Stress: Not on file  Social Connections: Unknown (10/30/2022)   Received from [facility]   Social Network    Social Network: Not on file   Vitals:   04/29/23 1646  BP: 110/70  Pulse: 63  Resp: 12  SpO2: 98%   Body mass index is 33.13 kg/m.  Wt Readings from Last 3 Encounters:  04/29/23 193 lb (87.5 kg)  12/10/22 181 lb 3.2 oz (82.2 kg)  08/13/22 175 lb (79.4 kg)   Physical Exam Vitals and nursing note reviewed.  Constitutional:      General: She is not in acute distress.    Appearance: She is well-developed.  HENT:     Head: Normocephalic and atraumatic.     Right Ear: Hearing, tympanic membrane, ear canal and external ear normal.     Left Ear: Hearing, tympanic membrane, ear canal and external ear normal.     Mouth/Throat:     Mouth: Mucous membranes are moist.     Pharynx: Oropharynx is clear. Uvula midline.  Eyes:     Extraocular Movements: Extraocular movements intact.     Conjunctiva/sclera:  Conjunctivae normal.     Pupils: Pupils are equal, round, and reactive to light.  Neck:     Thyroid: No thyromegaly.     Trachea: No tracheal deviation.  Cardiovascular:     Rate and Rhythm: Normal rate and regular rhythm.     Pulses:          Dorsalis pedis pulses are 2+ on the right side and 2+ on the left side.     Heart sounds: No murmur heard. Pulmonary:     Effort: Pulmonary effort is normal. No respiratory distress.     Breath sounds: Normal breath sounds.  Abdominal:     Palpations: Abdomen is soft. There is no hepatomegaly or mass.     Tenderness: There is no abdominal tenderness.  Genitourinary:    Comments: Deferred to gyn. Musculoskeletal:     Comments: No major deformity or signs of synovitis appreciated.  Lymphadenopathy:     Cervical: No cervical adenopathy.     Upper Body:     Right upper body: No supraclavicular adenopathy.     Left upper body: No supraclavicular adenopathy.  Skin:    General: Skin is warm.     Findings: No erythema or rash.  Neurological:     General: No focal deficit present.     Mental Status: She is alert and oriented to person, place, and time.     Cranial Nerves: No cranial nerve deficit.     Coordination: Coordination normal.     Gait: Gait normal.     Deep Tendon Reflexes:     Reflex Scores:      Bicep reflexes are 2+ on the right side and 2+ on the left side.      Patellar reflexes are 2+ on the right side and 2+ on the left side. Psychiatric:     Comments: Well groomed, good eye contact.   ASSESSMENT AND PLAN: Ms. Burnadette  R Hippert was here today annual physical examination.  Orders Placed This Encounter  Procedures   CBC with Differential/Platelets   CMP   Vitamin D, 25-hydroxy   Vitamin B12   Lipid Panel   Routine general medical examination at a health care facility Assessment & Plan: We discussed the importance of regular physical activity and healthy diet for prevention of chronic illness and/or  complications. Preventive guidelines reviewed. Vaccination up-to-date. Continue her female preventive care with gynecologist. Next CPE in a year.   Screening for endocrine, metabolic and immunity disorder -     Comprehensive metabolic panel; Future  Lipid screening -     Lipid panel; Future  Vitamin D deficiency, unspecified Assessment & Plan: She cannot not afford ergocalciferol 50,000 units, which was prescribed by her rheumatologist. Recommend OTC vitamin D 5000 units daily for 30 days then continue with 2000 units daily.  Orders: -     VITAMIN D 25 Hydroxy (Vit-D Deficiency, Fractures); Future  B12 deficiency Assessment & Plan: Mild. We discussed options, she preferred oral supplementation. Recommend B12 2000 mcg once per week.  Orders: -     Vitamin B12; Future  Sjogren's syndrome, with unspecified organ involvement [facility]) Assessment & Plan: Problem is stable. She is no longer following with rheumatologist.  Orders: -     CBC with Differential/Platelet; Future  Hyperlipidemia, unspecified hyperlipidemia type Assessment & Plan: Mild. Low fat diet recommended. F/U in a year.   In regard to plantar pruritus, no rash appreciated today. Recommend OTC Hydrocortisone cream, small amount , at bedtime.  Return in 1 year (on 04/28/2024) for CPE.  Avonlea Sima G. [place], MD  [facility]. [place] office.

## 2023-04-29 NOTE — Assessment & Plan Note (Signed)
Mild. Low fat diet recommended. F/U in a year.

## 2023-04-29 NOTE — Assessment & Plan Note (Addendum)
Mild. We discussed options, she preferred oral supplementation. Recommend B12 2000 mcg once per week.

## 2023-06-15 ENCOUNTER — Ambulatory Visit (INDEPENDENT_AMBULATORY_CARE_PROVIDER_SITE_OTHER): Payer: 59 | Admitting: Family Medicine

## 2023-06-15 ENCOUNTER — Encounter: Payer: Self-pay | Admitting: Family Medicine

## 2023-06-15 VITALS — BP 110/72 | HR 79 | Temp 98.1°F | Resp 12 | Ht 64.0 in | Wt 196.2 lb

## 2023-06-15 DIAGNOSIS — R0789 Other chest pain: Secondary | ICD-10-CM | POA: Diagnosis not present

## 2023-06-15 DIAGNOSIS — R002 Palpitations: Secondary | ICD-10-CM | POA: Diagnosis not present

## 2023-06-15 NOTE — Patient Instructions (Signed)
A few things to remember from today's visit:  Other chest pain  Palpitations - Plan: Basic metabolic panel, TSH, CBC  If you need refills for medications you take chronically, please call your pharmacy. Do not use My Chart to request refills or for acute issues that need immediate attention. If you send a my chart message, it may take a few days to be addressed, specially if I am not in the office.  Please be sure medication list is accurate. If a new problem present, please set up appointment sooner than planned today.

## 2023-06-15 NOTE — Progress Notes (Signed)
ACUTE VISIT Chief Complaint  Patient presents with   chest discomfort    Pain in center of chest that radiates up, having palpitations.    HPI: Veronica Raymond is a 37 y.o. female with a PMHx significant for GERD, Allergic Rhinitis, HLD, and polyarthralgia who is here today complaining of chest pain and palpitations She reports that she woke up with palpitations two days ago that lasted for about 2 hours.  HR 104-110/min. She is not sure if she was skipping beats. She denies associated cough, SOB, chest pain, lightheadedness, nausea,or epigastric pain during the episode.  Palpitations  Associated symptoms include anxiety. Pertinent negatives include no chest fullness, coughing, diaphoresis, dizziness, fever, irregular heartbeat, malaise/fatigue, near-syncope, numbness, syncope, vomiting or weakness. She has tried nothing for the symptoms. Risk factors include obesity, dyslipidemia and stress.  She reports having palpitations in the past but usually associated with anxiety, she does not feel like she is anxious at this time. Negative for high caffeine intake. She has been drinking adequate amount of fluids.  -She also complains of left side chest pain that radiates to her neck, and she described as soreness. She rates the pain as 6/10.   She says pain is worsened with heavy breathing, but not worsened with exertion or exercise.  She reports no diet changes and has been drinking plenty of fluids. She states the chest pain was not associated with episode of palpitations.  She has had EKGs in the past, last one EKG was last January/2023.   Review of Systems  Constitutional:  Negative for diaphoresis, fever and malaise/fatigue.  HENT:  Negative for sore throat and trouble swallowing.   Respiratory:  Negative for cough and wheezing.   Cardiovascular:  Positive for palpitations. Negative for syncope and near-syncope.  Gastrointestinal:  Negative for abdominal pain and vomiting.   Endocrine: Negative for cold intolerance and heat intolerance.  Neurological:  Negative for dizziness, weakness and numbness.  Psychiatric/Behavioral:  Negative for confusion. The patient is nervous/anxious.   See other pertinent positives and negatives in HPI.  Current Outpatient Medications on File Prior to Visit  Medication Sig Dispense Refill   bictegravir-emtricitabine-tenofovir AF (BIKTARVY) 50-200-25 MG TABS tablet Take 1 tablet by mouth daily. 30 tablet 0   cetirizine (ZYRTEC ALLERGY) 10 MG tablet Take 1 tablet (10 mg total) by mouth daily. 30 tablet 1   Multiple Vitamin (MULTIVITAMIN) tablet Take 1 tablet by mouth daily.     pantoprazole (PROTONIX) 20 MG tablet Take 20 mg by mouth daily.     sertraline (ZOLOFT) 25 MG tablet Take 1 tablet (25 mg total) by mouth daily. 30 tablet 3   No current facility-administered medications on file prior to visit.   Past Medical History:  Diagnosis Date   Allergic rhinitis    Anxiety    Bartholin gland cyst 05/04/2013   GERD (gastroesophageal reflux disease)    Migraine without aura, without mention of intractable migraine without mention of status migrainosus 07/09/2013   Ovarian cyst 2014   Polyarthralgia    Sjogren's syndrome (HCC)    Sleep apnea 1991   as a child, had surgery at age 32 and no longer has sleep apnea   Vaginal Pap smear, abnormal    Allergies  Allergen Reactions   Decongestant [Pseudoephedrine] Other (See Comments)    SI    Iodinated Contrast Media Hives   Percocet [Oxycodone-Acetaminophen] Itching   Flonase [Fluticasone]     Triggers migraines   Iodine Hives and Itching  Iohexol Hives and Itching   Sulfa Antibiotics Hives and Itching    Social History   Socioeconomic History   Marital status: Significant Other    Spouse name: Not on file   Number of children: 3   Years of education: college   Highest education level: Not on file  Occupational History   Occupation: CMA    Employer: [employer]     Comment: Adult nurse at [employer]  Tobacco Use   Smoking status: Never   Smokeless tobacco: Never  Vaping Use   Vaping status: Never Used  Substance and Sexual Activity   Alcohol use: Yes    Comment: couple drinks per month   Drug use: Never   Sexual activity: Yes    Birth control/protection: Surgical  Other Topics Concern   Not on file  Social History Narrative   Lives with her 4 children      Lives in two story home      Right handed      Highest level of edu- [employer]      Social Determinants of Health   Financial Resource Strain: Not on file  Food Insecurity: Not on file  Transportation Needs: Not on file  Physical Activity: Not on file  Stress: Not on file  Social Connections: Unknown (10/30/2022)   Received from [facility], [facility]   Social Network    Social Network: Not on file    Vitals:   06/15/23 1627  BP: 110/72  Pulse: 79  Resp: 12  Temp: 98.1 F (36.7 C)  SpO2: 98%   Body mass index is 33.69 kg/m.  Physical Exam Vitals and nursing note reviewed.  Constitutional:      General: She is not in acute distress.    Appearance: She is well-developed.  HENT:     Head: Normocephalic and atraumatic.  Eyes:     Conjunctiva/sclera: Conjunctivae normal.  Cardiovascular:     Rate and Rhythm: Normal rate and regular rhythm.     Heart sounds: No murmur heard. Pulmonary:     Effort: Pulmonary effort is normal. No respiratory distress.     Breath sounds: Normal breath sounds.  Chest:     Chest wall: Tenderness present. No edema.    Abdominal:     Palpations: Abdomen is soft. There is no mass.     Tenderness: There is no abdominal tenderness.  Musculoskeletal:     Right lower leg: No edema.     Left lower leg: No edema.  Lymphadenopathy:     Cervical: No cervical adenopathy.  Skin:    General: Skin is warm.     Findings: No erythema or rash.  Neurological:     General: No focal deficit present.     Mental Status: She is alert and  oriented to person, place, and time.     Cranial Nerves: No cranial nerve deficit.     Gait: Gait normal.  Psychiatric:        Mood and Affect: Mood and affect normal.   ASSESSMENT AND PLAN:  Veronica Raymond was seen today for chest pain and palpitations.   Other chest pain  Palpitations -     Basic metabolic panel; Future -     TSH; Future -     CBC; Future  We discussed possible etiologies for chest pain, history and examination suggest musculoskeletal pain. I do not think further workup is needed at this time. Avoid shallow breathing. If needed, she can take ibuprofen 400 to 600  mg 3 times daily with food for pain. Monitor for new symptoms.  -We discussed possible causes. History and examination do not suggest a serious process. She has had a few EKGs in the past and heart auscultation today is normal, so I think we can hold on EKG for now. She may benefit from a heart monitor, which can be done at the cardiologist Raymond.  For now she prefers to hold on cardiac consultation. Instructed about warning signs. We will arrange blood work: TSH, BMP, magnesium, and CBC.  Return if symptoms worsen or fail to improve.  I,Veronica Raymond,acting as a scribe for Veronica Seppala [place], MD.,have documented all relevant documentation on the behalf of Veronica Hosea [place], MD,as directed by  Veronica Muecke [place], MD while in the presence of Veronica Cilento [place], MD.  I, Veronica Raymond, have reviewed all documentation for this visit. The documentation on 06/15/23 for the exam, diagnosis, procedures, and orders are all accurate and complete.  Veronica Schaber G. [place], MD  Veronica Raymond.

## 2023-06-17 LAB — CBC
HCT: 37.9 % (ref 36.0–46.0)
Hemoglobin: 12.4 g/dL (ref 12.0–15.0)
MCHC: 32.7 g/dL (ref 30.0–36.0)
MCV: 93.7 fl (ref 78.0–100.0)
Platelets: 377 10*3/uL (ref 150.0–400.0)
RBC: 4.05 Mil/uL (ref 3.87–5.11)
RDW: 12.7 % (ref 11.5–15.5)
WBC: 6.3 10*3/uL (ref 4.0–10.5)

## 2023-06-17 LAB — BASIC METABOLIC PANEL
BUN: 17 mg/dL (ref 6–23)
CO2: 26 mEq/L (ref 19–32)
Calcium: 9.3 mg/dL (ref 8.4–10.5)
Chloride: 101 mEq/L (ref 96–112)
Creatinine, Ser: 0.85 mg/dL (ref 0.40–1.20)
GFR: 88.3 mL/min (ref >60.00)
Glucose, Bld: 84 mg/dL (ref 70–99)
Potassium: 3.9 mEq/L (ref 3.5–5.1)
Sodium: 137 mEq/L (ref 135–145)

## 2023-06-17 LAB — TSH: TSH: 0.95 u[IU]/mL (ref 0.35–5.50)

## 2023-06-17 LAB — MAGNESIUM: Magnesium: 1.8 mg/dL (ref 1.5–2.5)

## 2023-08-06 ENCOUNTER — Emergency Department (HOSPITAL_BASED_OUTPATIENT_CLINIC_OR_DEPARTMENT_OTHER)
Admission: EM | Admit: 2023-08-06 | Discharge: 2023-08-06 | Disposition: A | Discharge status: Home / Self Care | Payer: 59 | Attending: Emergency Medicine | Admitting: Emergency Medicine

## 2023-08-06 ENCOUNTER — Encounter (HOSPITAL_BASED_OUTPATIENT_CLINIC_OR_DEPARTMENT_OTHER): Payer: Self-pay | Admitting: Emergency Medicine

## 2023-08-06 ENCOUNTER — Other Ambulatory Visit: Payer: Self-pay

## 2023-08-06 DIAGNOSIS — M62838 Other muscle spasm: Secondary | ICD-10-CM | POA: Diagnosis not present

## 2023-08-06 LAB — BASIC METABOLIC PANEL
Anion gap: 9 (ref 5–15)
BUN: 14 mg/dL (ref 6–20)
CO2: 26 mmol/L (ref 22–32)
Calcium: 9.1 mg/dL (ref 8.9–10.3)
Chloride: 99 mmol/L (ref 98–111)
Creatinine, Ser: 0.9 mg/dL (ref 0.44–1.00)
GFR, Estimated: >60 mL/min (ref >60)
Glucose, Bld: 113 mg/dL — ABNORMAL HIGH (ref 70–99)
Potassium: 3.6 mmol/L (ref 3.5–5.1)
Sodium: 134 mmol/L — ABNORMAL LOW (ref 135–145)

## 2023-08-06 LAB — CK: Total CK: 116 U/L (ref 38–234)

## 2023-08-06 LAB — MAGNESIUM: Magnesium: 2 mg/dL (ref 1.7–2.4)

## 2023-08-06 MED ORDER — METHOCARBAMOL 500 MG PO TABS: 500.0000 mg | ORAL_TABLET | Freq: Three times a day (TID) | ORAL | 0 refills | Status: AC | PRN

## 2023-08-06 MED ORDER — SODIUM CHLORIDE 0.9 % IV BOLUS: 500.0000 mL | Freq: Once | INTRAVENOUS | Status: DC

## 2023-08-06 NOTE — ED Triage Notes (Signed)
States has been having muscle spasms all over x 2 days. Also would like to have left foot checked out due to stepping on an earring last week

## 2023-08-06 NOTE — ED Provider Notes (Signed)
Emergency Department Provider Note   I have reviewed the triage vital signs and the nursing notes.   HISTORY  Chief Complaint Spasms   HPI Veronica Raymond is a 37 y.o. female with past history reviewed below presents the emergency department with intermittent spasming pain in the arms and legs.  Symptoms have been occurring over the past 2 days.  She states that 1 week ago she stepped on a package of earrings at her house.  One of them made a small puncture to the bottom of the right foot.  She cleaned it and initially did not think much of this but when the spasms started in her arms and legs she became concerned.  She has been vaccinated against tetanus but is unsure if she is up-to-date.  She has intermittent, 6 out of 10, cramping pain in the arms and legs.  She estimates about 2 episodes per hour.  No jaw tightness, trouble breathing, or other severe symptoms.  No fevers.   Past Medical History:  Diagnosis Date   Allergic rhinitis    Anxiety    Bartholin gland cyst 05/04/2013   GERD (gastroesophageal reflux disease)    Migraine without aura, without mention of intractable migraine without mention of status migrainosus 07/09/2013   Ovarian cyst 2014   Polyarthralgia    Sjogren's syndrome (HCC)    Sleep apnea 1991   as a child, had surgery at age 50 and no longer has sleep apnea   Vaginal Pap smear, abnormal     Review of Systems  Constitutional: No fever/chills Cardiovascular: Denies chest pain. Respiratory: Denies shortness of breath. Gastrointestinal: No abdominal pain.  No nausea, no vomiting.   Musculoskeletal: Negative for back pain. Skin: Puncture to the bottom of the right foot.  Neurological: Negative for headaches.  ____________________________________________   PHYSICAL EXAM:  VITAL SIGNS: ED Triage Vitals  Encounter Vitals Group     BP 08/06/23 1056 133/89     Pulse Rate 08/06/23 1056 75     Resp 08/06/23 1056 16     Temp 08/06/23 1056 98.7 F  (37.1 C)     Temp Source 08/06/23 1056 Oral     SpO2 08/06/23 1056 100 %     Weight 08/06/23 1053 204 lb 6.4 oz (92.7 kg)     Height 08/06/23 1053 5\' 4"  (1.626 m)   Constitutional: Alert and oriented. Well appearing and in no acute distress. Eyes: Conjunctivae are normal.  Head: Atraumatic. Nose: No congestion/rhinnorhea. Mouth/Throat: Mucous membranes are moist.  Oropharynx non-erythematous. No trismus.  Neck: No stridor.  Cardiovascular: Good peripheral circulation.  Respiratory: Normal respiratory effort.  Gastrointestinal: No distention.  Musculoskeletal: No lower extremity tenderness nor edema. No gross deformities of extremities. Neurologic:  Normal speech and language. No gross focal neurologic deficits are appreciated. No increased tone.  Skin:  Skin is warm, dry and intact. No rash noted.   ____________________________________________   LABS (all labs ordered are listed, but only abnormal results are displayed)  Labs Reviewed  BASIC METABOLIC PANEL - Abnormal; Notable for the following components:      Result Value   Sodium 134 (*)    Glucose, Bld 113 (*)    All other components within normal limits  CK  MAGNESIUM   ____________________________________________   PROCEDURES  Procedure(s) performed:   Procedures  None  ____________________________________________   INITIAL IMPRESSION / ASSESSMENT AND PLAN / ED COURSE  Pertinent labs & imaging results that were available during my care of the  patient were reviewed by me and considered in my medical decision making (see chart for details).   This patient is Presenting for Evaluation of spasms, which does require a range of treatment options, and is a complaint that involves a moderate risk of morbidity and mortality.  The Differential Diagnoses include electrolyte abnormality, vitamin D deficiency, dehydration, tetanus, etc.  Reassessment after intervention:  patient declines IVF. Will do PO fluids.    Clinical Laboratory Tests Ordered, included CK normal.  Magnesium and potassium are normal.  Normal calcium.  No acute kidney injury.  Medical Decision Making: Summary:  Patient presents emergency department with intermittent cramping to the arms and legs over the past 2 days.  Differential is broad.  She has a very small puncture to the bottom of the right foot near the heel.  There is no surrounding erythema or warmth.  No purulence.  My overall suspicion for tetanus is exceedingly low.  I will obtain screening lab work and encourage p.o. fluids.   Reevaluation with update and discussion with patient.  I discussed that my overall suspicion for tetanus causing her occasional spasms is extremely low.  She is not having increased tone/clonus.  No jaw symptoms.  No fevers.  The wound is fairly superficial and well appearing. My plan is for oral fluids at home and DC home with a short course of muscle relaxant.  She will follow with her PCP to confirm that she is up-to-date on her tetanus shot. Does not want booster today. Discussed strict ED return precautions.   Patient's presentation is most consistent with acute presentation with potential threat to life or bodily function.   Disposition: discharge  ____________________________________________  FINAL CLINICAL IMPRESSION(S) / ED DIAGNOSES  Final diagnoses:  Muscle spasm     NEW OUTPATIENT MEDICATIONS STARTED DURING THIS VISIT:  New Prescriptions   METHOCARBAMOL (ROBAXIN) 500 MG TABLET    Take 1 tablet (500 mg total) by mouth every 8 (eight) hours as needed for muscle spasms.    Note:  This document was prepared using Dragon voice recognition software and may include unintentional dictation errors.  Alona Bene, MD, [facility]   Filbert Craze, Arlyss Repress, MD 08/06/23 [phone]

## 2023-08-06 NOTE — Discharge Instructions (Signed)
As we discussed, please drink plenty of hydrating fluids.  Robaxin can be taken as needed for muscle spasms but does cause drowsiness and you should avoid this if you will be driving or going to work.  I would like for you to follow with your primary care doctor to recheck your vitamin D levels.  If you have worsening stiffness/tightness, pain in your jaw, fevers you should return to the emergency department immediately for reevaluation.

## 2023-08-06 NOTE — ED Notes (Signed)
Pt requesting gatorade for rehydration instead of IV fluids. Blood obtained via venipuncture. One unsuccessful IV attempt

## 2023-08-12 ENCOUNTER — Encounter: Payer: Self-pay | Admitting: Family Medicine

## 2023-08-12 ENCOUNTER — Other Ambulatory Visit: Payer: Self-pay | Admitting: Family Medicine

## 2023-08-12 ENCOUNTER — Ambulatory Visit (INDEPENDENT_AMBULATORY_CARE_PROVIDER_SITE_OTHER): Payer: 59 | Admitting: Family Medicine

## 2023-08-12 VITALS — BP 100/72 | HR 58 | Temp 97.9°F | Ht 64.0 in | Wt 202.8 lb

## 2023-08-12 DIAGNOSIS — Z23 Encounter for immunization: Secondary | ICD-10-CM | POA: Diagnosis not present

## 2023-08-12 DIAGNOSIS — T148XXA Other injury of unspecified body region, initial encounter: Secondary | ICD-10-CM

## 2023-08-12 DIAGNOSIS — M62838 Other muscle spasm: Secondary | ICD-10-CM

## 2023-08-12 DIAGNOSIS — M2550 Pain in unspecified joint: Secondary | ICD-10-CM | POA: Diagnosis not present

## 2023-08-12 DIAGNOSIS — M791 Myalgia, unspecified site: Secondary | ICD-10-CM

## 2023-08-12 DIAGNOSIS — S91341A Puncture wound with foreign body, right foot, initial encounter: Secondary | ICD-10-CM

## 2023-08-12 DIAGNOSIS — E559 Vitamin D deficiency, unspecified: Secondary | ICD-10-CM

## 2023-08-12 LAB — COMPREHENSIVE METABOLIC PANEL
ALT: 10 U/L (ref 0–35)
AST: 13 U/L (ref 0–37)
Albumin: 4.2 g/dL (ref 3.5–5.2)
Alkaline Phosphatase: 55 U/L (ref 39–117)
BUN: 15 mg/dL (ref 6–23)
CO2: 26 meq/L (ref 19–32)
Calcium: 9.4 mg/dL (ref 8.4–10.5)
Chloride: 103 meq/L (ref 96–112)
Creatinine, Ser: 0.83 mg/dL (ref 0.40–1.20)
GFR: 90.76 mL/min (ref >60.00)
Glucose, Bld: 82 mg/dL (ref 70–99)
Potassium: 4.2 meq/L (ref 3.5–5.1)
Sodium: 136 meq/L (ref 135–145)
Total Bilirubin: 0.5 mg/dL (ref 0.2–1.2)
Total Protein: 7.4 g/dL (ref 6.0–8.3)

## 2023-08-12 LAB — CBC WITH DIFFERENTIAL/PLATELET
Basophils Absolute: 0 10*3/uL (ref 0.0–0.1)
Basophils Relative: 0.4 % (ref 0.0–3.0)
Eosinophils Absolute: 0 10*3/uL (ref 0.0–0.7)
Eosinophils Relative: 0.7 % (ref 0.0–5.0)
HCT: 38.1 % (ref 36.0–46.0)
Hemoglobin: 12.5 g/dL (ref 12.0–15.0)
Lymphocytes Relative: 46.2 % — ABNORMAL HIGH (ref 12.0–46.0)
Lymphs Abs: 2.7 10*3/uL (ref 0.7–4.0)
MCHC: 32.8 g/dL (ref 30.0–36.0)
MCV: 93.8 fL (ref 78.0–100.0)
Monocytes Absolute: 0.5 10*3/uL (ref 0.1–1.0)
Monocytes Relative: 7.9 % (ref 3.0–12.0)
Neutro Abs: 2.6 10*3/uL (ref 1.4–7.7)
Neutrophils Relative %: 44.8 % (ref 43.0–77.0)
Platelets: 356 10*3/uL (ref 150.0–400.0)
RBC: 4.06 Mil/uL (ref 3.87–5.11)
RDW: 12.7 % (ref 11.5–15.5)
WBC: 5.7 10*3/uL (ref 4.0–10.5)

## 2023-08-12 LAB — POCT URINALYSIS DIPSTICK
Bilirubin, UA: NEGATIVE
Blood, UA: NEGATIVE
Glucose, UA: NEGATIVE
Ketones, UA: NEGATIVE
Leukocytes, UA: NEGATIVE
Nitrite, UA: NEGATIVE
Protein, UA: NEGATIVE
Spec Grav, UA: 1.025 (ref 1.010–1.025)
Urobilinogen, UA: 0.2 U/dL
pH, UA: 5.5 (ref 5.0–8.0)

## 2023-08-12 LAB — VITAMIN B12: Vitamin B-12: 350 pg/mL (ref 211–911)

## 2023-08-12 LAB — VITAMIN D 25 HYDROXY (VIT D DEFICIENCY, FRACTURES): VITD: 18.26 ng/mL — ABNORMAL LOW (ref 30.00–100.00)

## 2023-08-12 MED ORDER — DOXYCYCLINE HYCLATE 100 MG PO TABS: 100.0000 mg | ORAL_TABLET | Freq: Two times a day (BID) | ORAL | 0 refills | Status: AC

## 2023-08-12 MED ORDER — VITAMIN D (ERGOCALCIFEROL) 1.25 MG (50000 UNIT) PO CAPS: 50000.0000 [IU] | ORAL_CAPSULE | ORAL | 0 refills | Status: AC

## 2023-08-12 NOTE — Progress Notes (Signed)
Established Patient Office Visit   Subjective  Patient ID: Veronica Raymond, female    DOB: 05/06/1986  Age: 37 y.o. MRN: [id number]  Chief Complaint  Patient presents with   Spasms    Patient came in today for muscle cramps started 2 weeks ago all over,     Patient is a 37 year old female with h/o fibromyalgia, Sjogren's, GERD, migraines, vitamin B12 deficiency, vitamin D deficiency who is followed by Dr. [place] and seen for acute concern.  Pt w/ worsening myalgias and joint pain x 1 wk.  Pt cut finger on glass while washing dishes and later stepped on the back of a 5 pack of earrings in her daughter's rm.  The earring posts punctured dorsum R foot.  Pt then developed muscle twitching in quads, calves, and arms.  Now with paresthesias in fingers and toes, generalized soreness, and fatigue.  Seen in ED 08/06/23 for symptoms.  BMP, mag, and CK total were largely negative, Sodium 134.  Now having a pulsing pain of lateral upper abs.  Walking at work difficult.  Unsure of last tetanus vaccine.  Per chart review had Tdap 08/16/2013 while pregnant.  Denies change in urine color but has noticed odor, smells like "pennies".    Patient Active Problem List   Diagnosis Date Noted   B12 deficiency 04/29/2023   Hyperlipidemia 04/29/2023   Routine general medical examination at a health care facility 08/17/2022   Herpes labialis without complication 04/15/2021   Fibromyalgia 06/24/2020   Sjogren's disease (HCC) 06/24/2020   Vitamin D deficiency, unspecified 08/20/2019   Class 1 obesity due to excess calories without serious comorbidity with body mass index (BMI) of 31.0 to 31.9 in adult 08/16/2019   Polyarthralgia 03/07/2019   History of abnormal cervical Pap smear 01/28/2018   Allergic rhinitis    GERD (gastroesophageal reflux disease)    Migraine without aura 07/09/2013   Past Medical History:  Diagnosis Date   Allergic rhinitis    Anxiety    Bartholin gland cyst 05/04/2013   GERD  (gastroesophageal reflux disease)    Migraine without aura, without mention of intractable migraine without mention of status migrainosus 07/09/2013   Ovarian cyst 2014   Polyarthralgia    Sjogren's syndrome (HCC)    Sleep apnea 1991   as a child, had surgery at age 26 and no longer has sleep apnea   Vaginal Pap smear, abnormal    Past Surgical History:  Procedure Laterality Date   ADENOIDECTOMY  1993   ESSURE TUBAL LIGATION  07/05/2014   THERAPEUTIC ABORTION     TONSILLECTOMY  1993   TYMPANOSTOMY TUBE PLACEMENT     UPPER GASTROINTESTINAL ENDOSCOPY  04/01/2022   Social History   Tobacco Use   Smoking status: Never   Smokeless tobacco: Never  Vaping Use   Vaping status: Never Used  Substance Use Topics   Alcohol use: Yes    Comment: couple drinks per month   Drug use: Never   Family History  Problem Relation Age of Onset   Hypertension Mother    Sleep apnea Mother    Obesity Mother    Heart disease Father    Alcohol abuse Father        dying from this   Diabetes Father    High blood pressure Father    Anxiety disorder Father    AAA (abdominal aortic aneurysm) Father    Mental retardation Maternal Uncle    Diabetes Maternal Grandmother    Hypertension Maternal Grandmother  Kidney disease Maternal Grandmother    Heart disease Maternal Grandmother    Stroke Maternal Grandmother    Prostate cancer Maternal Grandfather    Hypertension Paternal Grandmother    Alcohol abuse Other    Colon cancer Neg Hx    Colon polyps Neg Hx    Esophageal cancer Neg Hx    Rectal cancer Neg Hx    Stomach cancer Neg Hx    Allergies  Allergen Reactions   Decongestant [Pseudoephedrine] Other (See Comments)    SI    Iodinated Contrast Media Hives   Percocet [Oxycodone-Acetaminophen] Itching   Flonase [Fluticasone]     Triggers migraines   Iodine Hives and Itching   Iohexol Hives and Itching   Reglan [Metoclopramide] Other (See Comments)    HTN, shaky   Sulfa Antibiotics Hives  and Itching      ROS Negative unless stated above    Objective:     BP 100/72 (BP Location: Right Arm, Patient Position: Sitting, Cuff Size: Large)   Pulse (!) 58   Temp 97.9 F (36.6 C) (Oral)   Ht 5\' 4"  (1.626 m)   Wt 202 lb 12.8 oz (92 kg)   LMP 07/22/2023 (Exact Date)   SpO2 95%   BMI 34.81 kg/m  BP Readings from Last 3 Encounters:  08/12/23 100/72  08/06/23 113/65  06/15/23 110/72   Wt Readings from Last 3 Encounters:  08/12/23 202 lb 12.8 oz (92 kg)  08/06/23 204 lb 6.4 oz (92.7 kg)  06/15/23 196 lb 4 oz (89 kg)      Physical Exam Constitutional:      General: She is not in acute distress.    Appearance: Normal appearance.  HENT:     Head: Normocephalic and atraumatic.     Nose: Nose normal.     Mouth/Throat:     Mouth: Mucous membranes are moist.  Eyes:     Extraocular Movements: Extraocular movements intact.     Conjunctiva/sclera: Conjunctivae normal.  Cardiovascular:     Rate and Rhythm: Normal rate and regular rhythm.     Heart sounds: Normal heart sounds. No murmur heard.    No gallop.     Comments: No pitting edema bilaterally. Pulmonary:     Effort: Pulmonary effort is normal. No respiratory distress.     Breath sounds: Normal breath sounds. No wheezing, rhonchi or rales.  Musculoskeletal:     Comments: TTP of bilateral calves and medial thighs.  No joint edema, erythema.  Normal muscle tone.  Discomfort caused by movement of upper extremities at elbow.  Skin:    General: Skin is warm and dry.     Comments: Three- four, 2 mm hyperpigmented lesions on plantar surface of right foot.  No erythema, drainage, or opening of skin of right foot noted.  Neurological:     Mental Status: She is alert and oriented to person, place, and time.      Results for orders placed or performed in visit on 08/12/23  POCT urinalysis dipstick  Result Value Ref Range   Color, UA yellow    Clarity, UA clear    Glucose, UA Negative Negative   Bilirubin, UA neg     Ketones, UA neg    Spec Grav, UA 1.025 1.010 - 1.025   Blood, UA neg    pH, UA 5.5 5.0 - 8.0   Protein, UA Negative Negative   Urobilinogen, UA 0.2 0.2 or 1.0 E.U./dL   Nitrite, UA neg    Leukocytes,  UA Negative Negative   Appearance     Odor        Assessment & Plan:  Myalgia -     POCT urinalysis dipstick -     Tdap vaccine greater than or equal to 7yo IM -     CK total and CKMB (cardiac)not at [facility] -     CBC with Differential/Platelet -     VITAMIN D 25 Hydroxy (Vit-D Deficiency, Fractures) -     Vitamin B12 -     Comprehensive metabolic panel  Muscle spasm -     POCT urinalysis dipstick -     Tdap vaccine greater than or equal to 7yo IM -     CK total and CKMB (cardiac)not at [facility] -     CBC with Differential/Platelet -     VITAMIN D 25 Hydroxy (Vit-D Deficiency, Fractures) -     Vitamin B12 -     Comprehensive metabolic panel  Multiple joint pain -     Tdap vaccine greater than or equal to 7yo IM -     CK total and CKMB (cardiac)not at [facility] -     CBC with Differential/Platelet -     VITAMIN D 25 Hydroxy (Vit-D Deficiency, Fractures) -     Vitamin B12 -     Comprehensive metabolic panel  Need for Tdap vaccination -     Tdap vaccine greater than or equal to 7yo IM  Puncture wound -     Doxycycline Hyclate; Take 1 tablet (100 mg total) by mouth 2 (two) times daily for 7 days.  Dispense: 14 tablet; Refill: 0  Pt with new onset progress myalgias, arthralgias, muscle spasms after injury.  Discussed possible causes of symptoms including autoimmune disorder, fibromyalgia exacerbation, electrolyte abnormality, also consider tetanus.  Last tetanus vaccine 08/16/2013.  Given tetanus booster in clinic.  Doxycycline started.  Consider Ig therapy.  Labs from ED visit on 08/06/2023 reviewed.  Given worsening symptoms discussed additional labs to further evaluate symptoms.  Consider consult with ID and/or neuro.  Patient given strict precautions for worsening symptoms.  Return  if symptoms worsen or fail to improve.   Deeann Saint, MD

## 2023-08-14 LAB — CK TOTAL AND CKMB (NOT AT ARMC)
CK, MB: <0.7 ng/mL (ref 0–5.0)
Total CK: 114 U/L (ref 29–143)

## 2023-09-24 DIAGNOSIS — J309 Allergic rhinitis, unspecified: Secondary | ICD-10-CM | POA: Diagnosis not present

## 2023-09-24 DIAGNOSIS — T7840XA Allergy, unspecified, initial encounter: Secondary | ICD-10-CM | POA: Diagnosis not present

## 2024-01-12 ENCOUNTER — Other Ambulatory Visit (HOSPITAL_BASED_OUTPATIENT_CLINIC_OR_DEPARTMENT_OTHER): Payer: Self-pay

## 2024-01-12 ENCOUNTER — Encounter (HOSPITAL_BASED_OUTPATIENT_CLINIC_OR_DEPARTMENT_OTHER): Payer: Self-pay

## 2024-01-12 ENCOUNTER — Emergency Department (HOSPITAL_BASED_OUTPATIENT_CLINIC_OR_DEPARTMENT_OTHER)
Admission: EM | Admit: 2024-01-12 | Discharge: 2024-01-12 | Disposition: A | Discharge status: Home / Self Care | Attending: Emergency Medicine | Admitting: Emergency Medicine

## 2024-01-12 ENCOUNTER — Other Ambulatory Visit: Payer: Self-pay

## 2024-01-12 DIAGNOSIS — B9689 Other specified bacterial agents as the cause of diseases classified elsewhere: Secondary | ICD-10-CM

## 2024-01-12 DIAGNOSIS — R069 Unspecified abnormalities of breathing: Secondary | ICD-10-CM | POA: Diagnosis not present

## 2024-01-12 DIAGNOSIS — N760 Acute vaginitis: Secondary | ICD-10-CM | POA: Diagnosis not present

## 2024-01-12 DIAGNOSIS — Z202 Contact with and (suspected) exposure to infections with a predominantly sexual mode of transmission: Secondary | ICD-10-CM | POA: Diagnosis not present

## 2024-01-12 DIAGNOSIS — J069 Acute upper respiratory infection, unspecified: Secondary | ICD-10-CM

## 2024-01-12 DIAGNOSIS — N939 Abnormal uterine and vaginal bleeding, unspecified: Secondary | ICD-10-CM | POA: Diagnosis present

## 2024-01-12 DIAGNOSIS — N898 Other specified noninflammatory disorders of vagina: Secondary | ICD-10-CM | POA: Insufficient documentation

## 2024-01-12 DIAGNOSIS — Z711 Person with feared health complaint in whom no diagnosis is made: Secondary | ICD-10-CM

## 2024-01-12 LAB — PREGNANCY, URINE: Preg Test, Ur: NEGATIVE

## 2024-01-12 LAB — URINALYSIS, ROUTINE W REFLEX MICROSCOPIC
Bilirubin Urine: NEGATIVE
Glucose, UA: NEGATIVE mg/dL
Hgb urine dipstick: NEGATIVE
Ketones, ur: NEGATIVE mg/dL
Leukocytes,Ua: NEGATIVE
Nitrite: NEGATIVE
Protein, ur: NEGATIVE mg/dL
Specific Gravity, Urine: 1.015 (ref 1.005–1.030)
pH: 6 (ref 5.0–8.0)

## 2024-01-12 LAB — WET PREP, GENITAL
Sperm: NONE SEEN
Trich, Wet Prep: NONE SEEN
WBC, Wet Prep HPF POC: >=10 — AB (ref <10)
Yeast Wet Prep HPF POC: NONE SEEN

## 2024-01-12 LAB — RESP PANEL BY RT-PCR (RSV, FLU A&B, COVID)  RVPGX2
Influenza A by PCR: NEGATIVE
Influenza B by PCR: NEGATIVE
Resp Syncytial Virus by PCR: NEGATIVE
SARS Coronavirus 2 by RT PCR: NEGATIVE

## 2024-01-12 MED ORDER — LIDOCAINE 5 % EX PTCH
1.0000 | MEDICATED_PATCH | CUTANEOUS | 0 refills | Status: AC
  Filled 2024-01-12 (×2): qty 30, 30d supply, fill #0

## 2024-01-12 MED ORDER — METRONIDAZOLE 500 MG PO TABS
500.0000 mg | ORAL_TABLET | Freq: Two times a day (BID) | ORAL | 0 refills | Status: AC
  Filled 2024-01-12 (×2): qty 14, 7d supply, fill #0

## 2024-01-12 NOTE — ED Triage Notes (Addendum)
 Pt reports frontal and posterior head pressure for 5 days. Nasal congestion. Cough began yesterday. Neck pain Symptoms began with scratchy throat. Neg covid and flu test Pt also requesting STD screening due white vaginal discharge x 4 days

## 2024-01-12 NOTE — Discharge Instructions (Addendum)
 Patient relates evaluate you today.  You were diagnosed with BV.  I have sent antibiotics to your pharmacy.  You can follow-up with your other results on your MyChart.  If they are positive, we can start treatment.  You are negative for COVID, flu, RSV however there are thousands of other viruses that could be causing symptoms.  Please continue symptomatic care at home to include oral hydration, over-the-counter cough medicines, Mucinex  Return to emergency department if you experience chest pain, shortness breath, significant worsening symptoms

## 2024-01-12 NOTE — ED Provider Notes (Signed)
 [facility] Provider Note   CSN: [id number] Arrival date & time: 01/12/24  1303     History  Chief Complaint  Patient presents with   Vaginal Discharge   Facial Pain    Veronica Raymond is a 38 y.o. female with past medical history of GERD, migraine, fibromyalgia, Sjogren's disease, B12 deficiency, HLD, endometriosis presents to emergency department for evaluation of itchy throat, neck pain, headache, nausea, nasal congestion over the past 3 days.  Her daughter has similar symptoms but was negative for covid, flu, rsv.  She has been taking Mucinex, Zyrtec , ibuprofen  with no relief to symptoms.  She denies fever, chills.  She also complains of thick malodorous vaginal discharge for the past week.  She is interested in STD testing.  She denies urinary symptoms, pain with intercourse, vaginal bleeding.  LMP 12/18/23 and per her normal.   Vaginal Discharge     Home Medications Prior to Admission medications   Medication Sig Start Date End Date Taking? Authorizing Provider  lidocaine  (LIDODERM ) 5 % Place 1 patch onto the skin daily. Remove & Discard patch within 12 hours or as directed by MD 01/12/24  Yes Royann Cords, PA  metroNIDAZOLE  (FLAGYL ) 500 MG tablet Take 1 tablet (500 mg total) by mouth 2 (two) times daily. 01/12/24  Yes Royann Cords, PA  cetirizine  (ZYRTEC  ALLERGY) 10 MG tablet Take 1 tablet (10 mg total) by mouth daily. 09/13/20   Corbin Dess, PA-C  methocarbamol  (ROBAXIN ) 500 MG tablet Take 1 tablet (500 mg total) by mouth every 8 (eight) hours as needed for muscle spasms. 08/06/23   Long, Joshua G, MD  Multiple Vitamin (MULTIVITAMIN) tablet Take 1 tablet by mouth daily.    [provider]  pantoprazole  (PROTONIX ) 20 MG tablet Take 20 mg by mouth daily.    [provider]  sertraline  (ZOLOFT ) 25 MG tablet Take 1 tablet (25 mg total) by mouth daily. 04/02/22   [place], Betty G, MD  Vitamin D ,  Ergocalciferol , (DRISDOL ) 1.25 MG (50000 UNIT) CAPS capsule Take 1 capsule (50,000 Units total) by mouth every 7 (seven) days. 08/12/23   Viola Greulich, MD      Allergies    Decongestant [pseudoephedrine], Iodinated contrast media, Percocet [oxycodone -acetaminophen ], Flonase  [fluticasone ], Iodine, Iohexol , Reglan  [metoclopramide ], and Sulfa  antibiotics    Review of Systems   Review of Systems  HENT:  Positive for congestion.   Respiratory:  Positive for cough.   Genitourinary:  Positive for vaginal discharge.    Physical Exam Updated Vital Signs BP 111/62 (BP Location: Right Arm)   Pulse 82   Temp 99.1 F (37.3 C) (Oral)   Resp 18   Wt 88.5 kg   LMP 12/18/2023   SpO2 100%   BMI 33.47 kg/m  Physical Exam Vitals and nursing note reviewed.  Constitutional:      General: She is not in acute distress.    Appearance: Normal appearance. She is not ill-appearing.  HENT:     Head: Normocephalic and atraumatic.     Right Ear: Tympanic membrane, ear canal and external ear normal.     Left Ear: Tympanic membrane, ear canal and external ear normal.     Mouth/Throat:     Mouth: Mucous membranes are moist.     Pharynx: No oropharyngeal exudate or posterior oropharyngeal erythema.     Comments: Uvula midline. No abscess, fluctuance, erythema noted in mouth Eyes:     General: Lids are normal.  Vision grossly intact. No visual field deficit or scleral icterus.       Right eye: No discharge.        Left eye: No discharge.     Extraocular Movements:     Right eye: Normal extraocular motion and no nystagmus.     Left eye: Normal extraocular motion and no nystagmus.     Conjunctiva/sclera: Conjunctivae normal.  Neck:     Meningeal: Brudzinski's sign and Kernig's sign absent.  Cardiovascular:     Rate and Rhythm: Normal rate.     Pulses: Normal pulses.     Heart sounds: Normal heart sounds.  Pulmonary:     Effort: Pulmonary effort is normal. No respiratory distress.     Breath  sounds: Normal breath sounds. No stridor. No wheezing or rhonchi.  Chest:     Chest wall: No tenderness.  Abdominal:     General: There is no distension.     Palpations: Abdomen is soft. There is no mass.     Tenderness: There is no abdominal tenderness. There is no guarding.  Musculoskeletal:     Cervical back: Normal range of motion and neck supple. No rigidity or crepitus. Pain with movement and muscular tenderness (right paraspinous musculature) present. Normal range of motion.     Right lower leg: No edema.     Left lower leg: No edema.  Lymphadenopathy:     Cervical: No cervical adenopathy.  Skin:    General: Skin is warm.     Capillary Refill: Capillary refill takes less than 2 seconds.     Coloration: Skin is not jaundiced or pale.  Neurological:     Mental Status: She is alert and oriented to person, place, and time. Mental status is at baseline.     GCS: GCS eye subscore is 4. GCS verbal subscore is 5. GCS motor subscore is 6.     Cranial Nerves: No cranial nerve deficit, dysarthria or facial asymmetry.     Sensory: No sensory deficit.     Motor: No weakness, tremor, abnormal muscle tone, seizure activity or pronator drift.     Coordination: Coordination normal. Finger-Nose-Finger Test and Heel to Shin Test normal.     Gait: Gait normal.     Deep Tendon Reflexes: Reflexes normal.     ED Results / Procedures / Treatments   Labs (all labs ordered are listed, but only abnormal results are displayed) Labs Reviewed  WET PREP, GENITAL - Abnormal; Notable for the following components:      Result Value   Clue Cells Wet Prep HPF POC PRESENT (*)    WBC, Wet Prep HPF POC >=10 (*)    All other components within normal limits  RESP PANEL BY RT-PCR (RSV, FLU A&B, COVID)  RVPGX2  URINALYSIS, ROUTINE W REFLEX MICROSCOPIC  PREGNANCY, URINE  GC/CHLAMYDIA PROBE AMP ([place]) NOT AT [facility]    EKG EKG Interpretation Date/Time:  Thursday January 12 2024 15:27:32 EDT Ventricular  Rate:  81 PR Interval:  170 QRS Duration:  99 QT Interval:  361 QTC Calculation: 419 R Axis:   54  Text Interpretation: Sinus rhythm Normal ECG Confirmed by Almond [employer] ([id number]) on 01/12/2024 3:53:24 PM  Radiology No results found.  Procedures Procedures    Medications Ordered in ED Medications - No data to display  ED Course/ Medical Decision Making/ A&P  Medical Decision Making Amount and/or Complexity of Data Reviewed Labs: ordered.  Risk Prescription drug management.   Patient presents to the ED for concern of congestion, cough, vaginal discharge, this involves an extensive number of treatment options, and is a complaint that carries with it a high risk of complications and morbidity.  The differential diagnosis includes COVID, flu, RSV, pneumonia, BV, STD, PID, yeast infection   Co morbidities that complicate the patient evaluation  See HPI   Additional history obtained:  Additional history obtained from  Nursing   External records from outside source obtained and reviewed including triage note   Lab Tests:  I Ordered, and personally interpreted labs.  The pertinent results include:   No leukocytosis Respiratory panel negative Wet prep positive for BV UA negative for infection nor Hgb hCG negative    Medicines ordered and prescription drug management:  I ordered medication including metronidazole  for BV I have reviewed the patients home medicines and have made adjustments as needed     Problem List / ED Course:  Vaginal discharge BV Concerned about STD in female without diagnosis Interested only in GC/chlamydia testing.  She prefers to self swab and does not want GU exam at this time.  I think this is reasonable as she has no bleeding, pelvic pain, pain with intercourse and I have a low suspicion for PID UA without infection or blood.   GC chlamydia pending.  I offered to obtain RPR and HIV testing however  she declines at this time.  Will hold off on treating prophylactically for STDs as discharge is likely related to BV and she has no known STD exposure.  I told her that she can follow-up on MyChart for these results.  We will treat if positive. Provided 1 dose of Metro for BV here in emergency department as well as prescription URI No leukocytosis. Respiratory panel negative.   Reevaluation:  After the interventions noted above, I reevaluated the patient and found that they have :stayed the same   Social Determinants of Health:  Has PCP   Dispostion:  After consideration of the diagnostic results and the patients response to treatment, I feel that the patent would benefit from outpatient management with PCP follow-up.   Discussed ED workup, disposition, return to ED precautions with patient who expresses understanding agrees with plan.  All questions answered to their satisfaction.  They are agreeable to plan.  Discharge instructions provided on paperwork Final Clinical Impression(s) / ED Diagnoses Final diagnoses:  BV (bacterial vaginosis)  Vaginal discharge  Concern about STD in female without diagnosis  Upper respiratory tract infection, unspecified type    Rx / DC Orders ED Discharge Orders          Ordered    metroNIDAZOLE  (FLAGYL ) 500 MG tablet  2 times daily        01/12/24 1541    lidocaine  (LIDODERM ) 5 %  Every 24 hours        01/12/24 1559              Royann Cords, PA 01/13/24 Vennie Gimenez, MD 01/13/24 [phone]

## 2024-01-13 LAB — GC/CHLAMYDIA PROBE AMP (~~LOC~~) NOT AT ARMC
Chlamydia: NEGATIVE
Comment: NEGATIVE
Comment: NORMAL
Neisseria Gonorrhea: NEGATIVE

## 2024-06-21 ENCOUNTER — Emergency Department (HOSPITAL_COMMUNITY)
Admission: EM | Admit: 2024-06-21 | Discharge: 2024-06-21 | Disposition: A | Discharge status: Home / Self Care | Attending: Emergency Medicine | Admitting: Emergency Medicine

## 2024-06-21 ENCOUNTER — Other Ambulatory Visit: Payer: Self-pay

## 2024-06-21 ENCOUNTER — Emergency Department (HOSPITAL_COMMUNITY)

## 2024-06-21 DIAGNOSIS — W228XXA Striking against or struck by other objects, initial encounter: Secondary | ICD-10-CM | POA: Insufficient documentation

## 2024-06-21 DIAGNOSIS — S0990XA Unspecified injury of head, initial encounter: Secondary | ICD-10-CM | POA: Diagnosis present

## 2024-06-21 NOTE — ED Notes (Signed)
 AVS provided by edp was reviewed with the pt. Pt verbalized understanding with no additional questions at this time.
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
## 2024-06-21 NOTE — Discharge Instructions (Signed)
 It was a pleasure taking care of you here today.  Your workup today was reassuring.  Please make sure to follow-up the ophthalmologist.  If you are still having symptoms on Monday cannot get in with your ophthalmologist I have placed a number to our on-call ophthalmologist here.  Call to schedule an appointment let them know you were seen in the ED.
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
## 2024-07-07 ENCOUNTER — Encounter (HOSPITAL_BASED_OUTPATIENT_CLINIC_OR_DEPARTMENT_OTHER): Payer: Self-pay | Admitting: Emergency Medicine

## 2024-07-07 ENCOUNTER — Other Ambulatory Visit: Payer: Self-pay

## 2024-07-07 ENCOUNTER — Telehealth (HOSPITAL_BASED_OUTPATIENT_CLINIC_OR_DEPARTMENT_OTHER): Payer: Self-pay | Admitting: Emergency Medicine

## 2024-07-07 ENCOUNTER — Emergency Department (HOSPITAL_BASED_OUTPATIENT_CLINIC_OR_DEPARTMENT_OTHER): Admission: EM | Admit: 2024-07-07 | Discharge: 2024-07-07 | Disposition: A | Discharge status: Home / Self Care

## 2024-07-07 DIAGNOSIS — R519 Headache, unspecified: Secondary | ICD-10-CM | POA: Diagnosis present

## 2024-07-07 DIAGNOSIS — R300 Dysuria: Secondary | ICD-10-CM | POA: Diagnosis not present

## 2024-07-07 DIAGNOSIS — G8929 Other chronic pain: Secondary | ICD-10-CM | POA: Diagnosis not present

## 2024-07-07 LAB — URINALYSIS, W/ REFLEX TO CULTURE (INFECTION SUSPECTED)
Bilirubin Urine: NEGATIVE
Glucose, UA: NEGATIVE mg/dL
Ketones, ur: NEGATIVE mg/dL
Leukocytes,Ua: NEGATIVE
Nitrite: NEGATIVE
Protein, ur: NEGATIVE mg/dL
RBC / HPF: NONE SEEN RBC/hpf (ref 0–5)
Specific Gravity, Urine: 1.025 (ref 1.005–1.030)
WBC, UA: NONE SEEN WBC/hpf (ref 0–5)
pH: 6.5 (ref 5.0–8.0)

## 2024-07-07 MED ORDER — CEPHALEXIN 500 MG PO CAPS: 500.0000 mg | ORAL_CAPSULE | Freq: Four times a day (QID) | ORAL | 0 refills | Status: AC

## 2024-07-07 MED ORDER — CEPHALEXIN 500 MG PO CAPS: 500.0000 mg | ORAL_CAPSULE | Freq: Four times a day (QID) | ORAL | 0 refills | Status: DC

## 2024-07-07 NOTE — Telephone Encounter (Signed)
 Received call from patient that pharmacy that she initially elected to have her Keflex  sent to is closed, requested that prescription be rerouted to [facility] on [address] in [place].  Completed.

## 2024-07-07 NOTE — ED Notes (Signed)
 Pt alert and oriented X 4 at the time of discharge. RR even and unlabored. No acute distress noted. Pt verbalized understanding of discharge instructions as discussed. Pt ambulatory to lobby at time of discharge.

## 2024-07-07 NOTE — Discharge Instructions (Signed)
 Continue to take over-the-counter medications for your headache and toothache.  We have referred you to neurology, you may follow-up with them if you continue to have symptoms.  We have given you a dental resource list, please find a dentist to soon as possible for your tooth.  You may also find a primary care doctor on [email or web address] that can help manage her symptoms.  Return if you develop worsening headache, vision loss or droop, neck stiffness, rash, uncontrolled nausea vomiting, seizures, or any new or worsening symptoms that are concerning to you.
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
## 2024-07-07 NOTE — ED Triage Notes (Signed)
 Pt c/o HA and fatigue x 2 wks; hx of migraines, but sts this is not a migraine

## 2024-07-07 NOTE — ED Notes (Signed)
 Pt provided water per provider orders for urine sample.

## 2024-08-17 ENCOUNTER — Other Ambulatory Visit: Payer: Self-pay | Admitting: Medical Genetics

## 2024-09-10 ENCOUNTER — Other Ambulatory Visit: Payer: Self-pay

## 2024-10-21 ENCOUNTER — Other Ambulatory Visit: Payer: Self-pay

## 2024-10-21 ENCOUNTER — Emergency Department (HOSPITAL_BASED_OUTPATIENT_CLINIC_OR_DEPARTMENT_OTHER)

## 2024-10-21 ENCOUNTER — Emergency Department (HOSPITAL_BASED_OUTPATIENT_CLINIC_OR_DEPARTMENT_OTHER)
Admission: EM | Admit: 2024-10-21 | Discharge: 2024-10-21 | Disposition: A | Discharge status: Home / Self Care | Attending: Emergency Medicine | Admitting: Emergency Medicine

## 2024-10-21 DIAGNOSIS — Z202 Contact with and (suspected) exposure to infections with a predominantly sexual mode of transmission: Secondary | ICD-10-CM | POA: Insufficient documentation

## 2024-10-21 DIAGNOSIS — B3731 Acute candidiasis of vulva and vagina: Secondary | ICD-10-CM | POA: Diagnosis not present

## 2024-10-21 DIAGNOSIS — R102 Pelvic and perineal pain unspecified side: Secondary | ICD-10-CM | POA: Diagnosis present

## 2024-10-21 DIAGNOSIS — D259 Leiomyoma of uterus, unspecified: Secondary | ICD-10-CM | POA: Diagnosis not present

## 2024-10-21 LAB — URINALYSIS, ROUTINE W REFLEX MICROSCOPIC
Bilirubin Urine: NEGATIVE
Glucose, UA: NEGATIVE mg/dL
Hgb urine dipstick: NEGATIVE
Ketones, ur: NEGATIVE mg/dL
Leukocytes,Ua: NEGATIVE
Nitrite: NEGATIVE
Protein, ur: NEGATIVE mg/dL
Specific Gravity, Urine: >=1.03 (ref 1.005–1.030)
pH: 6 (ref 5.0–8.0)

## 2024-10-21 LAB — COMPREHENSIVE METABOLIC PANEL WITH GFR
ALT: 6 U/L (ref 0–44)
AST: 16 U/L (ref 15–41)
Albumin: 4.2 g/dL (ref 3.5–5.0)
Alkaline Phosphatase: 61 U/L (ref 38–126)
Anion gap: 10 (ref 5–15)
BUN: 13 mg/dL (ref 6–20)
CO2: 27 mmol/L (ref 22–32)
Calcium: 9.1 mg/dL (ref 8.9–10.3)
Chloride: 103 mmol/L (ref 98–111)
Creatinine, Ser: 0.93 mg/dL (ref 0.44–1.00)
GFR, Estimated: >60 mL/min
Glucose, Bld: 83 mg/dL (ref 70–99)
Potassium: 3.9 mmol/L (ref 3.5–5.1)
Sodium: 139 mmol/L (ref 135–145)
Total Bilirubin: 0.6 mg/dL (ref 0.0–1.2)
Total Protein: 7.6 g/dL (ref 6.5–8.1)

## 2024-10-21 LAB — CBC WITH DIFFERENTIAL/PLATELET
Abs Immature Granulocytes: 0.01 10*3/uL (ref 0.00–0.07)
Basophils Absolute: 0 10*3/uL (ref 0.0–0.1)
Basophils Relative: 1 %
Eosinophils Absolute: 0 10*3/uL (ref 0.0–0.5)
Eosinophils Relative: 1 %
HCT: 38 % (ref 36.0–46.0)
Hemoglobin: 12.4 g/dL (ref 12.0–15.0)
Immature Granulocytes: 0 %
Lymphocytes Relative: 51 %
Lymphs Abs: 2.8 10*3/uL (ref 0.7–4.0)
MCH: 29.9 pg (ref 26.0–34.0)
MCHC: 32.6 g/dL (ref 30.0–36.0)
MCV: 91.6 fL (ref 80.0–100.0)
Monocytes Absolute: 0.4 10*3/uL (ref 0.1–1.0)
Monocytes Relative: 8 %
Neutro Abs: 2.1 10*3/uL (ref 1.7–7.7)
Neutrophils Relative %: 39 %
Platelets: 364 10*3/uL (ref 150–400)
RBC: 4.15 MIL/uL (ref 3.87–5.11)
RDW: 12.2 % (ref 11.5–15.5)
WBC: 5.3 10*3/uL (ref 4.0–10.5)
nRBC: 0 % (ref 0.0–0.2)

## 2024-10-21 LAB — WET PREP, GENITAL
Clue Cells Wet Prep HPF POC: NONE SEEN
Sperm: NONE SEEN
Trich, Wet Prep: NONE SEEN
WBC, Wet Prep HPF POC: <10
Yeast Wet Prep HPF POC: NONE SEEN

## 2024-10-21 LAB — HCG, SERUM, QUALITATIVE: Preg, Serum: NEGATIVE

## 2024-10-21 MED ORDER — FLUCONAZOLE 150 MG PO TABS
150.0000 mg | ORAL_TABLET | Freq: Once | ORAL | Status: AC
  Administered 2024-10-21: 150 mg via ORAL
  Filled 2024-10-21: qty 1

## 2024-10-21 NOTE — ED Notes (Signed)

## 2024-10-21 NOTE — ED Triage Notes (Signed)
 Patient reports pelvic pain with vaginal odor x 3 days. States increase in vaginal discharge.

## 2024-10-21 NOTE — ED Notes (Signed)
 US  at bedside.

## 2024-10-21 NOTE — Discharge Instructions (Signed)
 You were seen in there ER today for evaluation of your symptoms. Your exam is consistent with a yeast infection. You were given medication for this today. Please follow up with your mychart for results. Your ultrasound shows that you have a fibroid. You can follow up with your GYN about this. I have attached information to the chart for you to review. If you have any concerns, new or worsening symptoms, please return to the ER for re-evaluation.   Contact a health care provider if: You have a fever. Your symptoms go away and then return. Your symptoms do not get better with treatment. Your symptoms get worse. You have new symptoms. You develop blisters in or around your vagina. You have blood coming from your vagina and it is not your menstrual period. You develop pain in your abdomen.

## 2024-10-21 NOTE — ED Provider Notes (Signed)
 " [place] [facility] AT [facility] Provider Note   CSN: [id number] Arrival date & time: 10/21/24  9066     Patient presents with: Pelvic Pain   Veronica Raymond is a 39 y.o. female with h/o endometriosis and Sjogren's presents to the ER today for evaluation of pelvis discomfort with vaginal discharge and odor since yesterday after her menstrual cycle ended. She describes the pelvic pain as cramping and occasionally sharp. She reports that she had unprotected sex last week. Would like STD testing.  She was concerned as she had some foul-smelling vaginal odor yesterday and wanted this checked out.  She is not having any dysuria, hematuria, urgency, frequency, nausea, vomiting, fever, diarrhea, constipation, melena, or hematochezia.  She reports that she has had bacterial vaginosis before.   Pelvic Pain Pertinent negatives include no abdominal pain.       Prior to Admission medications  Medication Sig Start Date End Date Taking? Authorizing Provider  cephALEXin  (KEFLEX ) 500 MG capsule Take 1 capsule (500 mg total) by mouth 4 (four) times daily. 07/07/24   Rogelia Jerilynn RAMAN, MD  cetirizine  (ZYRTEC  ALLERGY) 10 MG tablet Take 1 tablet (10 mg total) by mouth daily. 09/13/20   Stuart Vernell Norris, PA-C  lidocaine  (LIDODERM ) 5 % Place 1 patch onto the skin daily. Remove & Discard patch within 12 hours or as directed by MD 01/12/24   Minnie Tinnie BRAVO, PA  methocarbamol  (ROBAXIN ) 500 MG tablet Take 1 tablet (500 mg total) by mouth every 8 (eight) hours as needed for muscle spasms. 08/06/23   Long, Fonda MATSU, MD  metroNIDAZOLE  (FLAGYL ) 500 MG tablet Take 1 tablet (500 mg total) by mouth 2 (two) times daily. 01/12/24   Baron, Lauren E, PA  Multiple Vitamin (MULTIVITAMIN) tablet Take 1 tablet by mouth daily.    [provider]  pantoprazole  (PROTONIX ) 20 MG tablet Take 20 mg by mouth daily.    [provider]  sertraline  (ZOLOFT ) 25 MG tablet Take 1 tablet (25  mg total) by mouth daily. 04/02/22   Jordan, Betty G, MD  Vitamin D , Ergocalciferol , (DRISDOL ) 1.25 MG (50000 UNIT) CAPS capsule Take 1 capsule (50,000 Units total) by mouth every 7 (seven) days. 08/12/23   Mercer Clotilda JONELLE, MD    Allergies: Decongestant [pseudoephedrine], Iodinated contrast media, Percocet [oxycodone -acetaminophen ], Flonase  [fluticasone ], Iodine, Iohexol , Reglan  [metoclopramide ], and Sulfa  antibiotics    Review of Systems  Constitutional:  Negative for chills and fever.  Gastrointestinal:  Negative for abdominal pain, constipation, diarrhea, nausea and vomiting.  Genitourinary:  Positive for pelvic pain and vaginal discharge. Negative for dysuria, flank pain, genital sores and hematuria.    Updated Vital Signs BP 103/65 (BP Location: Right Arm)   Pulse 87   Temp 98.5 F (36.9 C) (Oral)   Resp 16   SpO2 98%   Physical Exam Vitals and nursing note reviewed. Exam conducted with a chaperone present [name], RN).  Constitutional:      General: She is not in acute distress.    Appearance: She is not ill-appearing or toxic-appearing.  Eyes:     General: No scleral icterus. Cardiovascular:     Rate and Rhythm: Normal rate.  Pulmonary:     Effort: Pulmonary effort is normal. No respiratory distress.  Abdominal:     Palpations: Abdomen is soft.     Tenderness: There is no guarding or rebound.  Genitourinary:    Exam position: Lithotomy position.     Labia:  Right: No rash, tenderness or lesion.        Left: No rash, tenderness or lesion.      Cervix: No cervical motion tenderness.     Comments: Pink vaginal rugae present with thick white discharge. No Cervical abnormality. No CMT. No lesions or bleeding noted.  Skin:    General: Skin is warm and dry.  Neurological:     Mental Status: She is alert.     (all labs ordered are listed, but only abnormal results are displayed) Labs Reviewed  WET PREP, GENITAL  COMPREHENSIVE METABOLIC PANEL WITH GFR   URINALYSIS, ROUTINE W REFLEX MICROSCOPIC  HCG, SERUM, QUALITATIVE  CBC WITH DIFFERENTIAL/PLATELET  SYPHILIS: RPR W/REFLEX TO RPR TITER AND TREPONEMAL ANTIBODIES, TRADITIONAL SCREENING AND DIAGNOSIS ALGORITHM  CBC WITH DIFFERENTIAL/PLATELET  HIV ANTIBODY (ROUTINE TESTING W REFLEX)  GC/CHLAMYDIA PROBE AMP () NOT AT [facility]    EKG: None  Radiology: US  PELVIC COMPLETE W TRANSVAGINAL AND TORSION R/O Result Date: 10/21/2024 EXAM: US  Pelvis, Complete Transvaginal and Transabdominal with Doppler 10/21/2024 10:54:20 AM TECHNIQUE: Transabdominal and transvaginal pelvic duplex ultrasound using B-mode/gray scaled imaging with Doppler spectral analysis and color flow was obtained. COMPARISON: US  Pelvis Complete 02/15/2009. CT abdomen and pelvis 09/11/2008. Pelvis MRI 08/16/2013. CLINICAL HISTORY: 39 year old female with pelvic pain, midline pelvic pain, and a history of fallopian tube contraceptive devices. FINDINGS: UTERUS: Uterus measures 7.7 x 4.0 x 5.4 cm. Estimated volume is 87 mL. normal background myometrial echotexture. Mildly exophytic ventral uterine body mixed echogenicity mass is 21 x 14 x 17 mm (series 1 image 69) compatible with subserosal fibroid. ENDOMETRIAL STRIPE: Endometrial stripe measures 6.9 mm. Endometrial stripe is within normal limits. RIGHT OVARY: Right ovary measures 2.9 x 1.3 x 2.3 cm. Estimated volume is 5 mL. Multiple small follicles (physiologic). There is normal arterial and venous Doppler flow. LEFT OVARY: Left ovary measures 2.5 x 1.3 x 2.2 cm. Estimated volume is 4 mL. Multiple small follicles (physiologic). There is normal arterial and venous Doppler flow. FREE FLUID: No free fluid. OTHER FINDINGS: Partially visible echogenicity related to fallopian tube contraception device (on the right series 1 image 61). IMPRESSION: 1. Negative for ovarian mass or torsion. 2. Subserosal ventral uterine fibroid (2.1 cm). Electronically signed by: helayne hall MD 10/21/2024 11:35 AM  EST RP Workstation: [id number]    Procedures   Medications Ordered in the ED  fluconazole  (DIFLUCAN ) tablet 150 mg (150 mg Oral Given 10/21/24 1153)    Medical Decision Making Amount and/or Complexity of Data Reviewed Labs: ordered. Radiology: ordered.  Risk Prescription drug management.   39 y.o. female presents to the ER for evaluation of vaginal discharge with malodor and some very lower pelvic cramping. Differential diagnosis includes but is not limited to UTI, endometriosis, PID, STD, yeast, vaginitis, BV. Vital signs unremarkable. Physical exam as noted above.   Patient recently finished her menstrual cycle.  Very mild lower suprapubic tenderness to palpation.  No guarding or rebound.  Soft.  Patient does not appear in any acute distress and is on phone sitting comfortably on the stretcher.  GU examination shows some dry mucosa.  No lesions.  Did have some thicker white discharge present.  No cervical motion tenderness.  Does not show any concern for any PID.  Swab obtained.  I do not think she needs any CT imaging given rather benign abdominal examination.  Doubt appendicitis.  Doubt obstruction.  She does not have any other associated symptoms such as fever, nausea, vomiting, diarrhea, or constipation.  She  does not have any urinary symptoms.  Just recently finished her menstrual cycle.  I do see she has a diagnosis of endometriosis, could be was causing some of her symptoms.  Ultrasound ordered.  I independently reviewed and interpreted the patient's labs. GC, RPR, HIV pending. CBC unremarkable. CMP unremarkable. Urinalysis unremarkable. HCG negative.  Wet prep negative.  US  Shows 1. Negative for ovarian mass or torsion. 2. Subserosal ventral uterine fibroid (2.1 cm). Per radiologist's interpretation.    Patient is not appear in any acute distress.  She has minimal abdominal tenderness to the suprapubic region.  Could be experiencing some pain from recent menstrual cycle or from  uterine fibroid seen.  You not feel the patient has any obstruction or appendicitis as she is not have any other associated symptoms and is not consistent with presentations.  She does not appear in any acute distress and her vital signs are stable.  Recommend that she follow-up with her MyChart results.  I will examination, she does have exam more consistent with yeast vaginitis.  Will give her a dose of Diflucan  here.  Recommended following up with her gynecologist as well as with her MyChart results.  Stable for discharge.  We discussed the results of the labs/imaging. The plan is supportive care, follow-up. We discussed strict return precautions and red flag symptoms. The patient verbalized their understanding and agrees to the plan. The patient is stable and being discharged home in good condition.  Portions of this report may have been transcribed using voice recognition software. Every effort was made to ensure accuracy; however, inadvertent computerized transcription errors may be present.    Final diagnoses:  Yeast vaginitis  Encounter for assessment of STD exposure  Uterine leiomyoma, unspecified location    ED Discharge Orders     None          Bernis Ernst, DEVONNA 10/21/24 1219    Patsey Lot, MD 10/21/24 1248  "

## 2024-10-22 LAB — GC/CHLAMYDIA PROBE AMP (~~LOC~~) NOT AT ARMC
Chlamydia: NEGATIVE
Comment: NEGATIVE
Comment: NORMAL
Neisseria Gonorrhea: NEGATIVE

## 2024-10-22 LAB — SYPHILIS: RPR W/REFLEX TO RPR TITER AND TREPONEMAL ANTIBODIES, TRADITIONAL SCREENING AND DIAGNOSIS ALGORITHM: RPR Ser Ql: NONREACTIVE

## 2024-10-22 LAB — HIV ANTIBODY (ROUTINE TESTING W REFLEX): HIV Screen 4th Generation wRfx: NONREACTIVE
